# Patient Record
Sex: Male | Born: 1935
Health system: Southern US, Community
[De-identification: ages and names within clinical notes are randomized; demographics above are authoritative.]

## PROBLEM LIST (undated history)

## (undated) DIAGNOSIS — G5710 Meralgia paresthetica, unspecified lower limb: Secondary | ICD-10-CM

## (undated) DIAGNOSIS — R002 Palpitations: Secondary | ICD-10-CM

## (undated) DIAGNOSIS — K8020 Calculus of gallbladder without cholecystitis without obstruction: Secondary | ICD-10-CM

## (undated) DIAGNOSIS — I4891 Unspecified atrial fibrillation: Secondary | ICD-10-CM

## (undated) DIAGNOSIS — J449 Chronic obstructive pulmonary disease, unspecified: Secondary | ICD-10-CM

## (undated) DIAGNOSIS — N529 Male erectile dysfunction, unspecified: Secondary | ICD-10-CM

## (undated) DIAGNOSIS — H9190 Unspecified hearing loss, unspecified ear: Secondary | ICD-10-CM

## (undated) DIAGNOSIS — H269 Unspecified cataract: Secondary | ICD-10-CM

## (undated) DIAGNOSIS — R011 Cardiac murmur, unspecified: Secondary | ICD-10-CM

## (undated) DIAGNOSIS — D126 Benign neoplasm of colon, unspecified: Secondary | ICD-10-CM

## (undated) DIAGNOSIS — R339 Retention of urine, unspecified: Secondary | ICD-10-CM

## (undated) DIAGNOSIS — L309 Dermatitis, unspecified: Secondary | ICD-10-CM

## (undated) DIAGNOSIS — K219 Gastro-esophageal reflux disease without esophagitis: Secondary | ICD-10-CM

## (undated) DIAGNOSIS — N400 Enlarged prostate without lower urinary tract symptoms: Secondary | ICD-10-CM

## (undated) DIAGNOSIS — D329 Benign neoplasm of meninges, unspecified: Secondary | ICD-10-CM

## (undated) DIAGNOSIS — T7840XA Allergy, unspecified, initial encounter: Secondary | ICD-10-CM

## (undated) DIAGNOSIS — I10 Essential (primary) hypertension: Secondary | ICD-10-CM

## (undated) DIAGNOSIS — I6789 Other cerebrovascular disease: Principal | ICD-10-CM

## (undated) HISTORY — DX: Male erectile dysfunction, unspecified: N52.9

## (undated) HISTORY — PX: COLONOSCOPY W/ POLYPECTOMY: SHX1380

## (undated) HISTORY — DX: Benign neoplasm of colon, unspecified: D12.6

## (undated) HISTORY — DX: Essential (primary) hypertension: I10

## (undated) HISTORY — DX: Unspecified cataract: H26.9

## (undated) HISTORY — DX: Meralgia paresthetica, unspecified lower limb: G57.10

## (undated) HISTORY — DX: Retention of urine, unspecified: R33.9

## (undated) HISTORY — DX: Benign prostatic hyperplasia without lower urinary tract symptoms: N40.0

## (undated) HISTORY — DX: Calculus of gallbladder without cholecystitis without obstruction: K80.20

## (undated) HISTORY — DX: Allergy, unspecified, initial encounter: T78.40XA

## (undated) HISTORY — DX: Gastro-esophageal reflux disease without esophagitis: K21.9

## (undated) HISTORY — DX: Chronic obstructive pulmonary disease, unspecified: J44.9

## (undated) HISTORY — DX: Dermatitis, unspecified: L30.9

## (undated) HISTORY — PX: POLYPECTOMY: SHX149

## (undated) HISTORY — DX: Benign neoplasm of meninges, unspecified: D32.9

## (undated) HISTORY — PX: CARDIAC CATHETERIZATION: SHX172

## (undated) HISTORY — DX: Unspecified hearing loss, unspecified ear: H91.90

## (undated) HISTORY — DX: Palpitations: R00.2

## (undated) HISTORY — DX: Unspecified atrial fibrillation: I48.91

## (undated) HISTORY — PX: COLONOSCOPY: SHX174

## (undated) HISTORY — DX: Other cerebrovascular disease: I67.89

---

## 1997-12-31 HISTORY — PX: GUM SURGERY: SHX658

## 1998-10-21 ENCOUNTER — Ambulatory Visit (HOSPITAL_COMMUNITY): Admission: RE | Admit: 1998-10-21 | Discharge: 1998-10-21 | Payer: Self-pay | Admitting: Gastroenterology

## 2002-11-20 ENCOUNTER — Encounter: Admission: RE | Admit: 2002-11-20 | Discharge: 2002-11-20 | Payer: Self-pay | Admitting: Internal Medicine

## 2002-11-20 ENCOUNTER — Encounter: Payer: Self-pay | Admitting: Internal Medicine

## 2003-02-10 ENCOUNTER — Encounter: Payer: Self-pay | Admitting: Urology

## 2003-02-15 ENCOUNTER — Encounter (INDEPENDENT_AMBULATORY_CARE_PROVIDER_SITE_OTHER): Payer: Self-pay | Admitting: Specialist

## 2003-02-15 ENCOUNTER — Inpatient Hospital Stay (HOSPITAL_COMMUNITY): Admission: RE | Admit: 2003-02-15 | Discharge: 2003-02-22 | Payer: Self-pay | Admitting: Urology

## 2003-02-15 HISTORY — PX: SUPRAPUBIC PROSTATECTOMY: SUR1056

## 2003-02-15 HISTORY — PX: BLADDER DIVERTICULECTOMY: SHX1235

## 2003-02-17 ENCOUNTER — Encounter: Payer: Self-pay | Admitting: Urology

## 2003-02-21 ENCOUNTER — Encounter: Payer: Self-pay | Admitting: Urology

## 2003-06-01 HISTORY — PX: MEATOTOMY: SUR859

## 2003-06-23 ENCOUNTER — Ambulatory Visit (HOSPITAL_BASED_OUTPATIENT_CLINIC_OR_DEPARTMENT_OTHER): Admission: RE | Admit: 2003-06-23 | Discharge: 2003-06-23 | Payer: Self-pay | Admitting: Urology

## 2009-11-30 ENCOUNTER — Encounter: Admission: RE | Admit: 2009-11-30 | Discharge: 2009-11-30 | Payer: Self-pay | Admitting: General Surgery

## 2009-11-30 HISTORY — PX: INGUINAL HERNIA REPAIR: SUR1180

## 2009-12-02 ENCOUNTER — Ambulatory Visit (HOSPITAL_BASED_OUTPATIENT_CLINIC_OR_DEPARTMENT_OTHER): Admission: RE | Admit: 2009-12-02 | Discharge: 2009-12-02 | Payer: Self-pay | Admitting: General Surgery

## 2011-04-03 LAB — DIFFERENTIAL
Basophils Absolute: 0 10*3/uL (ref 0.0–0.1)
Eosinophils Relative: 2 % (ref 0–5)
Lymphocytes Relative: 36 % (ref 12–46)
Monocytes Absolute: 0.5 10*3/uL (ref 0.1–1.0)
Monocytes Relative: 7 % (ref 3–12)
Neutro Abs: 3.9 10*3/uL (ref 1.7–7.7)

## 2011-04-03 LAB — COMPREHENSIVE METABOLIC PANEL
AST: 18 U/L (ref 0–37)
Albumin: 3.8 g/dL (ref 3.5–5.2)
Alkaline Phosphatase: 59 U/L (ref 39–117)
BUN: 33 mg/dL — ABNORMAL HIGH (ref 6–23)
Chloride: 106 mEq/L (ref 96–112)
Creatinine, Ser: 1.02 mg/dL (ref 0.4–1.5)
GFR calc Af Amer: >60 mL/min (ref >60)
Potassium: 4.8 mEq/L (ref 3.5–5.1)
Total Bilirubin: 1 mg/dL (ref 0.3–1.2)
Total Protein: 5.7 g/dL — ABNORMAL LOW (ref 6.0–8.3)

## 2011-04-03 LAB — CBC
HCT: 46.3 % (ref 39.0–52.0)
Hemoglobin: 16 g/dL (ref 13.0–17.0)
MCHC: 34.6 g/dL (ref 30.0–36.0)
MCV: 86.3 fL (ref 78.0–100.0)
Platelets: 175 10*3/uL (ref 150–400)
RBC: 5.36 MIL/uL (ref 4.22–5.81)
RDW: 13.5 % (ref 11.5–15.5)
WBC: 7.1 10*3/uL (ref 4.0–10.5)

## 2011-05-18 NOTE — Op Note (Signed)
NAMELUKKA, BLACK                          ACCOUNT NO.:  [id number]   MEDICAL RECORD NO.:  [id number]                   PATIENT TYPE:  INP   LOCATION:  X002                                 FACILITY:  [facility]   PHYSICIAN:  Maretta Bees. Vonita Moss, M.D.             DATE OF BIRTH:  1929-10-17   DATE OF PROCEDURE:  02/15/2003  DATE OF DISCHARGE:                                 OPERATIVE REPORT   PREOPERATIVE DIAGNOSES:  1. Benign prostatic hypertrophy.  2. Prostatism.  3. Large residual urines.  4. Large left bladder diverticulum.   PROCEDURES:  Suprapubic prostatectomy and bladder diverticulectomy.   SURGEON:  Maretta Bees. Vonita Moss, M.D.   ASSISTANT:   ANESTHESIA:  General endotracheal.   INDICATIONS:  This is a 77 year old gentleman who has had a large prostate,  elevated PVRs, and elevated PSAs for a long time despite Proscar and  Terazosin therapy.  He has developed increased residual urine to up to 300  mL in his bladder and a known bladder diverticulum and now has increased to  over 1000 mL in size, causing some abdominal protuberance.  He was counseled  about the procedure and brought to the OR.   DESCRIPTION OF PROCEDURE:  The patient was brought to the operating room and  placed in the supine position and the lower abdomen and external genitalia  were prepped and draped in the usual fashion.  A 24 French 30 mL Foley was  inserted into the bladder without difficulty, and close to 2 L of urine were  removed.  A midline incision was made and the rectus fascia divided and the  left bladder diverticulum was partially dissected out, and then we opened up  the bladder in the midline and found the 3 cm mouth of this diverticulum and  put in bilateral ureteral catheters to protect the ureters.  The  diverticulum was stuffed with nine 4 x 18 gauzes to delineate it.  Sharp and  blunt dissection mobilized this diverticulum all the way back to just near  the left ureter.  The diverticular  opening was then resected with cautery  and the mucosa peeled away and with the left ureter protected, we dissected  down to the mouth of the diverticulum and removed the diverticulum  completely.  The mucosa and muscle of the diverticulum was closed on the  bladder side with running 2-0 chromic catgut, and outside 0 chromic catgut  was used to place a second layer in this diverticular neck closure.  He had  a very large median lobe, which was probably almost as big as his lateral  lobes, and after scoring the bladder neck with electrocautery and with the  ureteral catheters in place, blunt dissection was used to enucleate both  lateral lobes and the median lobes.  They were quite adherent since they  were not giant and not totally mature, but we got the prostatic fossa  cleaned  out quite nicely.  The bladder neck was then given hemostasis by  putting running 0 chromic catgut on each side of the bladder neck, again  with the ureteral catheters in place to protect the orifices.  A couple of  tiny bleeders in the prostatic fossa were coagulated and then a 24 French 30  mL Foley was inserted after redilating the urethral meatus.  Through a stab  wound to the right of our incision, a 22 French 5 mL Foley was brought  through the abdominal wall and in through the bladder and sewn in place with  a pursestring suture.  A Blake drain was placed to the left of our incision  through the stab wound and used to drain the prevesical space, and the  bladder was closed with two layers of running 0 chromic catgut.  There was 4-  5 mL in the Foley balloon and 10 mL in the suprapubic tube balloon.  The  bladder was then irrigated and there was nice, clear irrigation from the  Foley catheter after the second switch.  The wound was irrigated and the  rectus fascia closed with running #1 PDS, the subcu was irrigated, the skin  closed with skin staples.  The Blake drain and suprapubic tube were sewn in  place  with black silk.  The wound was cleaned, dressed with dry sterile  gauze dressing.  Sponge, needle, and instrument count was correct.  Estimated blood loss was 800 mL.  He tolerated the procedure well.                                                 Maretta Bees. Vonita Moss, M.D.    LJP/MEDQ  D:  02/15/2003  T:  02/15/2003  Job:  [id number]   cc:   Soyla Murphy. Renne Crigler, M.D.  [address]  [place]  [place] [address]  Fax: [phone]

## 2011-05-18 NOTE — Op Note (Signed)
   NAMEYOHAN, SAMONS                          ACCOUNT NO.:  [id number]   MEDICAL RECORD NO.:  [id number]                   PATIENT TYPE:  AMB   LOCATION:  NESC                                 FACILITY:  [facility]   PHYSICIAN:  Maretta Bees. Vonita Moss, M.D.             DATE OF BIRTH:  08/21/1929   DATE OF PROCEDURE:  06/23/2003  DATE OF DISCHARGE:                                 OPERATIVE REPORT   PREOPERATIVE DIAGNOSIS:  Distal urethral stricture/meatal stricture.   POSTOPERATIVE DIAGNOSIS:  Distal urethral stricture/meatal stricture.   PROCEDURE:  Distal urethral meatotomy with YAG laser.   SURGEON:  Maretta Bees. Vonita Moss, M.D.   ANESTHESIA:  General.   INDICATIONS:  This gentleman has undergone a suprapubic prostatectomy for  significant bladder outlet obstruction that also required a bladder  diverticulectomy, and he voids well except for development of a distal  urethral/meatal stricture that keeps recurring.  He is brought to the OR  today for an attempt to more definitively repair this with a meatotomy and  possible use of the laser.   PROCEDURE:  The patient was brought to the operating room and placed in the  lithotomy position.  External genitalia were prepped and draped in the usual  fashion.  I passed a 16 sound and without dilating him encountered distal  resistance.  I took a straight clamp and put it at 6 o'clock on the meatus  and put the clamp on for hemostasis and then used a hook-blade knife to  start the meatotomy.  I then put a 20 French red Robinson catheter in place  and with that in place to protect the opposite wall of the urethra and  spreading the start of the meatotomy, I used the YAG laser at 15 watts to  create a generous meatotomy that then allowed passage of sounds up to 4  [place] with no resistance whatsoever.  He will be taught to spread the  meatus postoperatively and continue some self-catheterizations.  He  tolerated the procedure well.                                       Maretta Bees. Vonita Moss, M.D.    LJP/MEDQ  D:  06/23/2003  T:  06/23/2003  Job:  [id number]

## 2011-05-18 NOTE — H&P (Signed)
Jared Neal, Jared Neal                          ACCOUNT NO.:  [id number]   MEDICAL RECORD NO.:  [id number]                   PATIENT TYPE:  INP   LOCATION:  NA                                   FACILITY:  [facility]   PHYSICIAN:  Maretta Bees. Vonita Moss, M.D.             DATE OF BIRTH:  May 23, 1929   DATE OF ADMISSION:  02/15/2003  DATE OF DISCHARGE:                                HISTORY & PHYSICAL   HISTORY OF PRESENT ILLNESS:  This 77 year old white male has had a long  history of bladder outlet obstructive symptoms, enlarged prostate, and  elevated PSA levels.  He has been on chronic therapy with Terazosin and  Proscar.  However, despite that, he has developed worsening of his large  residual urines and worsening of his retained urine, known bladder  diverticulum.  He has left bladder diverticulum now.  He had a residual of  1063 cc, and his bladder had 368 cc with an intravesical nodular prostate  tissue.  His PVR's in the past were just 100 cc to 215, and the diverticulum  was only 153 cc to 300 cc in size, so in view of this significant change and  large amount of retained urine, it was felt that suprapubic prostatectomy  and excision of the diverticulum was appropriate.  He was counseled about  hospital stay, hemorrhage, infection, injury to surrounding tissues, and the  need for an urethral and suprapubic tube.  He was cleared preoperatively by  Dr. Merri Brunette.   PAST MEDICAL HISTORY:  1. Hypertension.  2. Occasional cardiac arrhythmia.   PAST SURGICAL HISTORY:  Left inguinal hernia repair.   MEDICATIONS:  1. Norvasc 5 mg daily.  2. Terazosin 2 mg q.p.m. and 4 mg at bedtime.  3. Proscar 5 mg daily.  4. Aspirin, which he has stopped.  5. Viagra p.r.n.  6. Valium p.r.n.  7. Ibuprofen p.r.n.   ALLERGIES:  No known drug allergies.   HABITS:  He does drink wine.  He does smoke a very small number of  cigarettes daily.   FAMILY HISTORY:  Noncontributory.   REVIEW OF  SYMPTOMS:  No other abnormalities.   PHYSICAL EXAMINATION:  VITAL SIGNS:  His blood pressure is 160/84, pulse 72,  temperature 97.  GENERAL:  He is alert and oriented.  SKIN:  Warm and dry.  RESPIRATORY:  No respiratory distress.  HEART:  Heart tones are regular.  ABDOMEN:  Soft and nontender.  GENITOURINARY:  External genitalia normal.  Prostate is benign.   IMPRESSION:  1. Benign prostatic hypertrophy with prostatism.  2. Bladder diverticulum.  3. Hypertension.  4. History of cardiac arrythmia.  5. Chronic low-grade cigarette abuse.   PLAN:  Suprapubic prostatectomy and bladder diverticulectomy.  Maretta Bees. Vonita Moss, M.D.    LJP/MEDQ  D:  02/15/2003  T:  02/15/2003  Job:  [id number]

## 2011-05-18 NOTE — Discharge Summary (Signed)
Jared Neal, Jared Neal                          ACCOUNT NO.:  [id number]   MEDICAL RECORD NO.:  [id number]                   PATIENT TYPE:  INP   LOCATION:  0367                                 FACILITY:  [facility]   PHYSICIAN:  Maretta Bees. Vonita Moss, M.D.             DATE OF BIRTH:  [birth year]   DATE OF ADMISSION:  02/15/2003  DATE OF DISCHARGE:  02/22/2003                                 DISCHARGE SUMMARY   ADMISSION DIAGNOSES:  1. Bladder diverticulectomy.  2. Bladder outlet obstruction secondary to benign prostatic hyperplasia.   DISCHARGE DIAGNOSES:  1. Bladder diverticulectomy.  2. Bladder outlet obstruction secondary to benign prostatic hyperplasia.   PROCEDURE:  1. Bladder diverticulectomy.  2. Suprapubic prostatectomy.   HISTORY AND PHYSICAL:  For full details please see admission history and  physical.  Briefly, the patient is a gentleman with a history of lower  urinary tract symptom secondary to benign prostatic hyperplasia.  On  evaluation the patient was also noted to have a large bladder diverticulum  on the left posterior aspect of his bladder with a capacity that had  increased from just over 200 mL now to greater than 1 L.  After discussing  treatment options the patient elected to proceed with the bladder  diverticulectomy as well as a suprapubic prostatectomy at that time.   HOSPITAL COURSE:  The patient was taken to the operating room on February 15, 2003 and underwent the above procedures.  He tolerated these well and  without complications.  Postoperatively the patient was maintained on  continuous bladder irrigation and was transferred to the stepdown unit to  manage his bladder irrigation.  That evening the patient was noted to be  hypotensive with systolic blood pressures in the 70s and 80s.  He was  initially given a fluid challenge which did not correct his hypotension.  Of  note, the patient did not become tachycardic.  However, a CBC was checked to  ensure that, however, patient was not hypovolemic due to acute blood loss.  His hemoglobin did return lower than would be expected.  Therefore, the  patient was transfused 2 units of packed red blood cells.  He subsequently  became normotensive and was maintained in the intensive care unit for close  monitoring.  The following morning the patient's hemoglobin did appear to  increase appropriately after his transfusion.  However, the patient's  creatinine had increased to 2.4.  The patient was initially managed  conservatively.  The patient did require one more unit of packed red blood  cells, but then remained hemodynamically stable and with a stable hemoglobin  following this point.  Due to his acute renal failure a CT scan was obtained  to rule out an obstructive cause.  This did reveal a left-sided pelvic  hematoma which likely was the source of his initial acute blood loss.  There  appeared to be minimal to moderate left-sided  hydronephrosis, but no right-  sided hydronephrosis.  The patient also had been on a Cox 2 inhibitor which  was subsequently discontinued.  Renal consultation was also obtained.  The  patient's creatinine peaked as high as 3.3 and subsequently began declining.  The patient's continuous bladder irrigation was able to be shut off during  his renal failure and he was noted to continue to make adequate urine  output.  It was felt that the patient's acute renal failure was either due  to his recent hypovolemic episode or possibly his nonsteroidal anti-  inflammatory drug.  His renal function was monitored over the next few days  and his renal function subsequently returned to normal.  By postoperative  day seven the patient was tolerating a regular diet and ambulating without  difficulty.  His Foley catheter was able to be removed and his suprapubic  was left to straight drainage.  The patient did have some low grade fever  during his last couple of days in the hospital  as high as 101-102.  The  patient had a chest x-ray which demonstrated no evidence of pneumonia.  He  also had a CBC which demonstrated a normal white blood count.  A urinalysis  was performed and did demonstrate some pyuria.  This was sent for culture  and the patient was begun on Levaquin empirically.  His temperature was down  on the morning of postoperative day seven and it was decided to discharge  him home with plans to follow up his urine culture.  He was discharged home  in stable condition.   DISPOSITION:  Home.   DISCHARGE MEDICATIONS:  The patient was instructed to resume his regular  home medications.  In addition, he was given a prescription for Vicodin for  pain as well as Levaquin.   DISCHARGE INSTRUCTIONS:  The patient was instructed to resume his diet as  before surgery.  He was also encouraged to be ambulatory.  However, he was  specifically instructed to refrain from any heavy lifting, strenuous  activity, or driving.  He was instructed on use of a leg bag for his  suprapubic catheter.  He was also instructed to call should he have any  problems with continued fever or problems with his suprapubic tube.   FOLLOW UP:  A follow-up appointment was made for the patient to follow up  with Maretta Bees. Vonita Moss, M.D. in two days for further evaluation and possible  clamping of the suprapubic tube with a trial of voiding.     [name], M.D.                          Maretta Bees. Vonita Moss, M.D.    LB/MEDQ  D:  02/22/2003  T:  02/22/2003  Job:  [id number]

## 2012-01-01 HISTORY — PX: CATARACT EXTRACTION: SUR2

## 2012-03-25 DIAGNOSIS — L259 Unspecified contact dermatitis, unspecified cause: Secondary | ICD-10-CM | POA: Diagnosis not present

## 2012-03-25 DIAGNOSIS — B351 Tinea unguium: Secondary | ICD-10-CM | POA: Diagnosis not present

## 2012-03-25 DIAGNOSIS — L821 Other seborrheic keratosis: Secondary | ICD-10-CM | POA: Diagnosis not present

## 2012-06-25 DIAGNOSIS — IMO0002 Reserved for concepts with insufficient information to code with codable children: Secondary | ICD-10-CM | POA: Diagnosis not present

## 2012-07-16 DIAGNOSIS — L820 Inflamed seborrheic keratosis: Secondary | ICD-10-CM | POA: Diagnosis not present

## 2012-07-16 DIAGNOSIS — R002 Palpitations: Secondary | ICD-10-CM | POA: Diagnosis not present

## 2012-07-17 DIAGNOSIS — R002 Palpitations: Secondary | ICD-10-CM | POA: Diagnosis not present

## 2012-07-24 DIAGNOSIS — H2510 Age-related nuclear cataract, unspecified eye: Secondary | ICD-10-CM | POA: Diagnosis not present

## 2012-08-04 DIAGNOSIS — H2510 Age-related nuclear cataract, unspecified eye: Secondary | ICD-10-CM | POA: Diagnosis not present

## 2012-08-04 DIAGNOSIS — H2181 Floppy iris syndrome: Secondary | ICD-10-CM | POA: Diagnosis not present

## 2012-08-04 DIAGNOSIS — H269 Unspecified cataract: Secondary | ICD-10-CM | POA: Diagnosis not present

## 2012-08-06 DIAGNOSIS — H2510 Age-related nuclear cataract, unspecified eye: Secondary | ICD-10-CM | POA: Diagnosis not present

## 2012-08-18 DIAGNOSIS — H2510 Age-related nuclear cataract, unspecified eye: Secondary | ICD-10-CM | POA: Diagnosis not present

## 2012-08-30 DIAGNOSIS — Z23 Encounter for immunization: Secondary | ICD-10-CM | POA: Diagnosis not present

## 2012-10-14 DIAGNOSIS — Z7902 Long term (current) use of antithrombotics/antiplatelets: Secondary | ICD-10-CM | POA: Diagnosis not present

## 2012-10-14 DIAGNOSIS — I10 Essential (primary) hypertension: Secondary | ICD-10-CM | POA: Diagnosis not present

## 2012-10-14 DIAGNOSIS — K219 Gastro-esophageal reflux disease without esophagitis: Secondary | ICD-10-CM | POA: Diagnosis not present

## 2012-10-14 DIAGNOSIS — Z0000 Encounter for general adult medical examination without abnormal findings: Secondary | ICD-10-CM | POA: Diagnosis not present

## 2012-10-16 DIAGNOSIS — R9431 Abnormal electrocardiogram [ECG] [EKG]: Secondary | ICD-10-CM | POA: Diagnosis not present

## 2012-10-16 DIAGNOSIS — K219 Gastro-esophageal reflux disease without esophagitis: Secondary | ICD-10-CM | POA: Diagnosis not present

## 2012-10-16 DIAGNOSIS — R972 Elevated prostate specific antigen [PSA]: Secondary | ICD-10-CM | POA: Diagnosis not present

## 2012-10-16 DIAGNOSIS — R339 Retention of urine, unspecified: Secondary | ICD-10-CM | POA: Diagnosis not present

## 2012-11-24 DIAGNOSIS — R002 Palpitations: Secondary | ICD-10-CM | POA: Diagnosis not present

## 2012-11-24 DIAGNOSIS — I10 Essential (primary) hypertension: Secondary | ICD-10-CM | POA: Diagnosis not present

## 2012-12-08 DIAGNOSIS — R39198 Other difficulties with micturition: Secondary | ICD-10-CM | POA: Diagnosis not present

## 2012-12-08 DIAGNOSIS — N401 Enlarged prostate with lower urinary tract symptoms: Secondary | ICD-10-CM | POA: Diagnosis not present

## 2012-12-08 DIAGNOSIS — N35919 Unspecified urethral stricture, male, unspecified site: Secondary | ICD-10-CM | POA: Diagnosis not present

## 2012-12-12 ENCOUNTER — Encounter: Payer: Self-pay | Admitting: Cardiovascular Disease

## 2012-12-12 ENCOUNTER — Encounter: Payer: Self-pay | Admitting: *Deleted

## 2012-12-12 DIAGNOSIS — R002 Palpitations: Secondary | ICD-10-CM | POA: Insufficient documentation

## 2012-12-12 DIAGNOSIS — N529 Male erectile dysfunction, unspecified: Secondary | ICD-10-CM | POA: Insufficient documentation

## 2012-12-12 DIAGNOSIS — R339 Retention of urine, unspecified: Secondary | ICD-10-CM | POA: Insufficient documentation

## 2012-12-12 DIAGNOSIS — I10 Essential (primary) hypertension: Secondary | ICD-10-CM | POA: Insufficient documentation

## 2012-12-12 DIAGNOSIS — L309 Dermatitis, unspecified: Secondary | ICD-10-CM | POA: Insufficient documentation

## 2012-12-12 DIAGNOSIS — G5710 Meralgia paresthetica, unspecified lower limb: Secondary | ICD-10-CM | POA: Insufficient documentation

## 2012-12-12 DIAGNOSIS — J449 Chronic obstructive pulmonary disease, unspecified: Secondary | ICD-10-CM | POA: Insufficient documentation

## 2012-12-12 DIAGNOSIS — K219 Gastro-esophageal reflux disease without esophagitis: Secondary | ICD-10-CM | POA: Insufficient documentation

## 2012-12-15 ENCOUNTER — Ambulatory Visit (INDEPENDENT_AMBULATORY_CARE_PROVIDER_SITE_OTHER): Payer: Medicare Other | Admitting: Cardiovascular Disease

## 2012-12-15 ENCOUNTER — Encounter: Payer: Self-pay | Admitting: Cardiovascular Disease

## 2012-12-15 VITALS — BP 130/80 | HR 68 | Resp 12 | Ht 71.0 in | Wt 161.0 lb

## 2012-12-15 DIAGNOSIS — R9431 Abnormal electrocardiogram [ECG] [EKG]: Secondary | ICD-10-CM | POA: Diagnosis not present

## 2012-12-15 DIAGNOSIS — R002 Palpitations: Secondary | ICD-10-CM | POA: Diagnosis not present

## 2012-12-15 DIAGNOSIS — I10 Essential (primary) hypertension: Secondary | ICD-10-CM

## 2012-12-15 NOTE — Progress Notes (Signed)
Patient ID: Jared Neal, male   DOB: 1929/10/18, 78 y.o.   MRN: [id number] 78 yo referred by Dr Renne Crigler. Has had HTN for over 30 years.  On two drug Rx.  Had cold recently and BP was elevated.  Was taking alkaseltzer cold medicine and nyquil.  Compliant with meds.  Exercises daily with no chest pain or dyspnea. BP is getting better over the last two weeks as cold subsided.  History of palpitations.  Now quiescent.  No history of PAF.  No history of kidney disease.  No other history of PVD or vascular disease.  ECG 11/23 with lateral T wave changes Has had bad reaction to procardia in past but not to any other BP meds  ROS: Denies fever, malais, weight loss, blurry vision, decreased visual acuity, cough, sputum, SOB, hemoptysis, pleuritic pain, palpitaitons, heartburn, abdominal pain, melena, lower extremity edema, claudication, or rash.  All other systems reviewed and negative   General: Affect appropriate Healthy:  appears stated age HEENT: normal Neck supple with no adenopathy JVP normal no bruits no thyromegaly Lungs clear with no wheezing and good diaphragmatic motion Heart:  S1/S2 no murmur,rub, gallop or click PMI normal Abdomen: benighn, BS positve, no tenderness, no AAA no bruit.  No HSM or HJR Distal pulses intact with no bruits No edema Neuro non-focal Skin warm and dry No muscular weakness  Medications Current Outpatient Prescriptions  Medication Sig Dispense Refill  . amLODipine (NORVASC) 5 MG tablet Take 1 tablet by mouth Daily.      . diclofenac sodium (VOLTAREN) 1 % GEL Apply topically as needed.      . dorzolamide-timolol (COSOPT) 22.3-6.8 MG/ML ophthalmic solution as directed.      Marland Kitchen doxylamine, Sleep, (UNISOM) 25 MG tablet Take 25 mg by mouth as needed.      . pantoprazole (PROTONIX) 40 MG tablet Take 1 tablet by mouth Daily.      Marland Kitchen terazosin (HYTRIN) 10 MG capsule Take 1 tablet by mouth Daily.        Allergies Procardia  Family History: No family history on  file.  Social History: History   Social History  . Marital Status: Divorced    Spouse Name: N/A    Number of Children: N/A  . Years of Education: N/A   Occupational History  . Not on file.   Social History Main Topics  . Smoking status: Not on file  . Smokeless tobacco: Not on file  . Alcohol Use: Not on file  . Drug Use: Not on file  . Sexually Active: Not on file   Other Topics Concern  . Not on file   Social History Narrative  . No narrative on file    Electrocardiogram:  11/22/12  Dr Phasrr's office  SR lateral T wave inversion  Assessment and Plan

## 2012-12-15 NOTE — Assessment & Plan Note (Signed)
Resolved with normal ECG and regular exercise with no recurrence

## 2012-12-15 NOTE — Assessment & Plan Note (Signed)
Currently back to baseline.  Although his current meds are not my first choice for BP meds he has been on them for a while and can continue Can increase norvasc to 10 mg if needed.  No need for w/u of secondary causes

## 2012-12-15 NOTE — Assessment & Plan Note (Signed)
Cannot find old one in system to compare.  Not classically LVH but may be related to long standing HTN.  In the abscence of symptoms no indication for further w/u  Yearly ECG

## 2012-12-15 NOTE — Patient Instructions (Signed)
Your physician wants you to follow-up in: YEAR WITH DR NISHAN  You will receive a reminder letter in the mail two months in advance. If you don't receive a letter, please call our office to schedule the follow-up appointment.  Your physician recommends that you continue on your current medications as directed. Please refer to the Current Medication list given to you today. 

## 2013-01-05 ENCOUNTER — Encounter: Payer: Self-pay | Admitting: Cardiovascular Disease

## 2013-01-06 DIAGNOSIS — H409 Unspecified glaucoma: Secondary | ICD-10-CM | POA: Diagnosis not present

## 2013-01-06 DIAGNOSIS — Z961 Presence of intraocular lens: Secondary | ICD-10-CM | POA: Diagnosis not present

## 2013-01-06 DIAGNOSIS — H4011X0 Primary open-angle glaucoma, stage unspecified: Secondary | ICD-10-CM | POA: Diagnosis not present

## 2013-01-06 DIAGNOSIS — H04129 Dry eye syndrome of unspecified lacrimal gland: Secondary | ICD-10-CM | POA: Diagnosis not present

## 2013-01-20 DIAGNOSIS — I10 Essential (primary) hypertension: Secondary | ICD-10-CM | POA: Diagnosis not present

## 2013-01-20 DIAGNOSIS — R9431 Abnormal electrocardiogram [ECG] [EKG]: Secondary | ICD-10-CM | POA: Diagnosis not present

## 2013-02-14 ENCOUNTER — Other Ambulatory Visit: Payer: Self-pay

## 2013-03-05 DIAGNOSIS — R141 Gas pain: Secondary | ICD-10-CM | POA: Diagnosis not present

## 2013-03-05 DIAGNOSIS — K2990 Gastroduodenitis, unspecified, without bleeding: Secondary | ICD-10-CM | POA: Diagnosis not present

## 2013-03-05 DIAGNOSIS — R143 Flatulence: Secondary | ICD-10-CM | POA: Diagnosis not present

## 2013-03-05 DIAGNOSIS — K2970 Gastritis, unspecified, without bleeding: Secondary | ICD-10-CM | POA: Diagnosis not present

## 2013-03-10 DIAGNOSIS — H4011X0 Primary open-angle glaucoma, stage unspecified: Secondary | ICD-10-CM | POA: Diagnosis not present

## 2013-03-10 DIAGNOSIS — Z961 Presence of intraocular lens: Secondary | ICD-10-CM | POA: Diagnosis not present

## 2013-03-10 DIAGNOSIS — H04129 Dry eye syndrome of unspecified lacrimal gland: Secondary | ICD-10-CM | POA: Diagnosis not present

## 2013-03-12 DIAGNOSIS — K921 Melena: Secondary | ICD-10-CM | POA: Diagnosis not present

## 2013-03-12 DIAGNOSIS — K219 Gastro-esophageal reflux disease without esophagitis: Secondary | ICD-10-CM | POA: Diagnosis not present

## 2013-03-18 DIAGNOSIS — Z1212 Encounter for screening for malignant neoplasm of rectum: Secondary | ICD-10-CM | POA: Diagnosis not present

## 2013-04-16 DIAGNOSIS — R9431 Abnormal electrocardiogram [ECG] [EKG]: Secondary | ICD-10-CM | POA: Diagnosis not present

## 2013-04-21 DIAGNOSIS — K921 Melena: Secondary | ICD-10-CM | POA: Diagnosis not present

## 2013-04-21 DIAGNOSIS — K219 Gastro-esophageal reflux disease without esophagitis: Secondary | ICD-10-CM | POA: Diagnosis not present

## 2013-08-05 ENCOUNTER — Other Ambulatory Visit: Payer: Self-pay

## 2013-08-24 DIAGNOSIS — Z23 Encounter for immunization: Secondary | ICD-10-CM | POA: Diagnosis not present

## 2013-09-09 DIAGNOSIS — H40019 Open angle with borderline findings, low risk, unspecified eye: Secondary | ICD-10-CM | POA: Diagnosis not present

## 2013-09-09 DIAGNOSIS — H4011X0 Primary open-angle glaucoma, stage unspecified: Secondary | ICD-10-CM | POA: Diagnosis not present

## 2013-09-09 DIAGNOSIS — Z961 Presence of intraocular lens: Secondary | ICD-10-CM | POA: Diagnosis not present

## 2013-10-27 DIAGNOSIS — I10 Essential (primary) hypertension: Secondary | ICD-10-CM | POA: Diagnosis not present

## 2013-10-27 DIAGNOSIS — K219 Gastro-esophageal reflux disease without esophagitis: Secondary | ICD-10-CM | POA: Diagnosis not present

## 2013-10-27 DIAGNOSIS — Z006 Encounter for examination for normal comparison and control in clinical research program: Secondary | ICD-10-CM | POA: Diagnosis not present

## 2013-10-27 DIAGNOSIS — Z0000 Encounter for general adult medical examination without abnormal findings: Secondary | ICD-10-CM | POA: Diagnosis not present

## 2013-10-27 DIAGNOSIS — J449 Chronic obstructive pulmonary disease, unspecified: Secondary | ICD-10-CM | POA: Diagnosis not present

## 2013-11-02 DIAGNOSIS — R002 Palpitations: Secondary | ICD-10-CM | POA: Diagnosis not present

## 2013-11-02 DIAGNOSIS — R9431 Abnormal electrocardiogram [ECG] [EKG]: Secondary | ICD-10-CM | POA: Diagnosis not present

## 2013-11-02 DIAGNOSIS — N401 Enlarged prostate with lower urinary tract symptoms: Secondary | ICD-10-CM | POA: Diagnosis not present

## 2013-11-02 DIAGNOSIS — K219 Gastro-esophageal reflux disease without esophagitis: Secondary | ICD-10-CM | POA: Diagnosis not present

## 2013-11-05 ENCOUNTER — Other Ambulatory Visit: Payer: Self-pay

## 2013-11-23 DIAGNOSIS — N390 Urinary tract infection, site not specified: Secondary | ICD-10-CM | POA: Diagnosis not present

## 2013-11-23 DIAGNOSIS — R300 Dysuria: Secondary | ICD-10-CM | POA: Diagnosis not present

## 2013-12-09 ENCOUNTER — Other Ambulatory Visit: Payer: Self-pay | Admitting: Endocrinology

## 2013-12-09 ENCOUNTER — Other Ambulatory Visit: Payer: Self-pay | Admitting: Internal Medicine

## 2013-12-09 DIAGNOSIS — M79609 Pain in unspecified limb: Secondary | ICD-10-CM | POA: Diagnosis not present

## 2013-12-09 DIAGNOSIS — R52 Pain, unspecified: Secondary | ICD-10-CM

## 2013-12-09 DIAGNOSIS — M79604 Pain in right leg: Secondary | ICD-10-CM

## 2013-12-10 ENCOUNTER — Ambulatory Visit
Admission: RE | Admit: 2013-12-10 | Discharge: 2013-12-10 | Disposition: A | Discharge status: Home / Self Care | Payer: Medicare Other | Source: Ambulatory Visit | Attending: Endocrinology | Admitting: Endocrinology

## 2013-12-10 DIAGNOSIS — M79609 Pain in unspecified limb: Secondary | ICD-10-CM | POA: Diagnosis not present

## 2013-12-10 DIAGNOSIS — R52 Pain, unspecified: Secondary | ICD-10-CM

## 2013-12-15 DIAGNOSIS — N401 Enlarged prostate with lower urinary tract symptoms: Secondary | ICD-10-CM | POA: Diagnosis not present

## 2013-12-15 DIAGNOSIS — N139 Obstructive and reflux uropathy, unspecified: Secondary | ICD-10-CM | POA: Diagnosis not present

## 2013-12-16 ENCOUNTER — Encounter: Payer: Self-pay | Admitting: Cardiovascular Disease

## 2013-12-16 ENCOUNTER — Ambulatory Visit (INDEPENDENT_AMBULATORY_CARE_PROVIDER_SITE_OTHER): Payer: Medicare Other | Admitting: Cardiovascular Disease

## 2013-12-16 VITALS — BP 140/80 | HR 80 | Ht 71.0 in | Wt 161.0 lb

## 2013-12-16 DIAGNOSIS — I4949 Other premature depolarization: Secondary | ICD-10-CM | POA: Diagnosis not present

## 2013-12-16 DIAGNOSIS — R002 Palpitations: Secondary | ICD-10-CM

## 2013-12-16 DIAGNOSIS — R9431 Abnormal electrocardiogram [ECG] [EKG]: Secondary | ICD-10-CM

## 2013-12-16 DIAGNOSIS — R011 Cardiac murmur, unspecified: Secondary | ICD-10-CM

## 2013-12-16 DIAGNOSIS — I493 Ventricular premature depolarization: Secondary | ICD-10-CM

## 2013-12-16 DIAGNOSIS — I10 Essential (primary) hypertension: Secondary | ICD-10-CM

## 2013-12-16 NOTE — Assessment & Plan Note (Signed)
Continue alpha blocker recent PSA with primary ok per patient Dr Renne Crigler

## 2013-12-16 NOTE — Assessment & Plan Note (Signed)
Resolved He really does not notice PVC;s  Continue Rx for BP

## 2013-12-16 NOTE — Assessment & Plan Note (Signed)
Apical MR murmur seems more prominent In setting of abnormal ECG, and PVC;s will check echo

## 2013-12-16 NOTE — Assessment & Plan Note (Signed)
Well controlled.  Continue current medications and low sodium Dash type diet.    

## 2013-12-16 NOTE — Patient Instructions (Signed)

## 2013-12-16 NOTE — Progress Notes (Signed)
Patient ID: Jared Neal, male   DOB: 17-Jun-1929, 79 y.o.   MRN: [id number] 79 yo referred by Dr Renne Crigler in 2013 . Has had HTN for over 30 years. On two drug Rx. Had cold recently and BP was elevated. Was taking alkaseltzer cold medicine and nyquil. Compliant with meds. Exercises daily with no chest pain or dyspnea. BP is getting better over the last two weeks as cold subsided. History of palpitations. Now quiescent. No history of PAF. No history of kidney disease. No other history of PVD or vascular disease. ECG 11/23 with lateral T wave changes Has had bad reaction to procardia in past but not to any other BP meds  Holter showing on ly PAC;s and PVC;s done 06/2012    Walks 2-4 miles/day on trails Does not notice PVC;s  No chest pain or dyspnea     ROS: Denies fever, malais, weight loss, blurry vision, decreased visual acuity, cough, sputum, SOB, hemoptysis, pleuritic pain, palpitaitons, heartburn, abdominal pain, melena, lower extremity edema, claudication, or rash.  All other systems reviewed and negative  General: Affect appropriate Healthy:  appears stated age HEENT: normal Neck supple with no adenopathy JVP normal no bruits no thyromegaly Lungs clear with no wheezing and good diaphragmatic motion Heart:  S1/S2 MR  Murmur at apex , no rub, gallop or click PMI normal Abdomen: benighn, BS positve, no tenderness, no AAA no bruit.  No HSM or HJR Distal pulses intact with no bruits No edema Neuro non-focal Skin warm and dry No muscular weakness   Current Outpatient Prescriptions  Medication Sig Dispense Refill  . amLODipine (NORVASC) 5 MG tablet Take 1 tablet by mouth Daily.      . diclofenac sodium (VOLTAREN) 1 % GEL Apply topically as needed.      . dorzolamide-timolol (COSOPT) 22.3-6.8 MG/ML ophthalmic solution as directed.      Marland Kitchen doxylamine, Sleep, (UNISOM) 25 MG tablet Take 25 mg by mouth as needed.      . pantoprazole (PROTONIX) 40 MG tablet Take 1 tablet by mouth Daily.      Marland Kitchen  terazosin (HYTRIN) 10 MG capsule Take 1 tablet by mouth Daily.       No current facility-administered medications for this visit.    Allergies  Procardia  Electrocardiogram:  11/25/12  SR rate 76 lateral T wave inversions  Today   Assessment and Plan

## 2013-12-16 NOTE — Assessment & Plan Note (Signed)
Chronic no change from 2013  May be reflective of LVH  Primarily lateral T wave inversions

## 2013-12-29 DIAGNOSIS — J441 Chronic obstructive pulmonary disease with (acute) exacerbation: Secondary | ICD-10-CM | POA: Diagnosis not present

## 2013-12-29 DIAGNOSIS — R609 Edema, unspecified: Secondary | ICD-10-CM | POA: Diagnosis not present

## 2014-01-05 ENCOUNTER — Ambulatory Visit (HOSPITAL_COMMUNITY): Payer: Medicare Other | Attending: Cardiology | Admitting: Radiology

## 2014-01-05 ENCOUNTER — Encounter: Payer: Self-pay | Admitting: Cardiology

## 2014-01-05 DIAGNOSIS — R002 Palpitations: Secondary | ICD-10-CM | POA: Diagnosis not present

## 2014-01-05 DIAGNOSIS — J449 Chronic obstructive pulmonary disease, unspecified: Secondary | ICD-10-CM | POA: Insufficient documentation

## 2014-01-05 DIAGNOSIS — I493 Ventricular premature depolarization: Secondary | ICD-10-CM

## 2014-01-05 DIAGNOSIS — I4949 Other premature depolarization: Secondary | ICD-10-CM | POA: Insufficient documentation

## 2014-01-05 DIAGNOSIS — I10 Essential (primary) hypertension: Secondary | ICD-10-CM | POA: Insufficient documentation

## 2014-01-05 DIAGNOSIS — R011 Cardiac murmur, unspecified: Secondary | ICD-10-CM | POA: Insufficient documentation

## 2014-01-05 DIAGNOSIS — R9431 Abnormal electrocardiogram [ECG] [EKG]: Secondary | ICD-10-CM | POA: Diagnosis not present

## 2014-01-05 DIAGNOSIS — I059 Rheumatic mitral valve disease, unspecified: Secondary | ICD-10-CM | POA: Insufficient documentation

## 2014-01-05 DIAGNOSIS — J4489 Other specified chronic obstructive pulmonary disease: Secondary | ICD-10-CM | POA: Insufficient documentation

## 2014-01-05 NOTE — Progress Notes (Signed)
Echocardiogram performed.  

## 2014-01-07 ENCOUNTER — Telehealth: Payer: Self-pay | Admitting: Cardiovascular Disease

## 2014-01-07 NOTE — Telephone Encounter (Signed)
PT AWARE OF ECHO RESULTS./CY 

## 2014-01-07 NOTE — Telephone Encounter (Signed)
New message    Returning nurses call

## 2014-01-21 DIAGNOSIS — R609 Edema, unspecified: Secondary | ICD-10-CM | POA: Diagnosis not present

## 2014-01-21 DIAGNOSIS — J449 Chronic obstructive pulmonary disease, unspecified: Secondary | ICD-10-CM | POA: Diagnosis not present

## 2014-02-15 DIAGNOSIS — S60459A Superficial foreign body of unspecified finger, initial encounter: Secondary | ICD-10-CM | POA: Diagnosis not present

## 2014-03-24 DIAGNOSIS — Z961 Presence of intraocular lens: Secondary | ICD-10-CM | POA: Diagnosis not present

## 2014-03-24 DIAGNOSIS — H40019 Open angle with borderline findings, low risk, unspecified eye: Secondary | ICD-10-CM | POA: Diagnosis not present

## 2014-03-24 DIAGNOSIS — H4720 Unspecified optic atrophy: Secondary | ICD-10-CM | POA: Diagnosis not present

## 2014-03-24 DIAGNOSIS — H4011X0 Primary open-angle glaucoma, stage unspecified: Secondary | ICD-10-CM | POA: Diagnosis not present

## 2014-03-25 DIAGNOSIS — H401290 Low-tension glaucoma, unspecified eye, stage unspecified: Secondary | ICD-10-CM | POA: Diagnosis not present

## 2014-04-02 ENCOUNTER — Telehealth (HOSPITAL_COMMUNITY): Payer: Self-pay | Admitting: *Deleted

## 2014-04-02 ENCOUNTER — Other Ambulatory Visit (HOSPITAL_COMMUNITY): Payer: Self-pay | Admitting: Internal Medicine

## 2014-04-02 DIAGNOSIS — S0990XA Unspecified injury of head, initial encounter: Secondary | ICD-10-CM

## 2014-04-07 ENCOUNTER — Ambulatory Visit (INDEPENDENT_AMBULATORY_CARE_PROVIDER_SITE_OTHER): Payer: Medicare Other | Admitting: Neurology

## 2014-04-07 ENCOUNTER — Encounter: Payer: Self-pay | Admitting: Neurology

## 2014-04-07 VITALS — BP 134/80 | HR 76 | Ht 71.0 in | Wt 160.0 lb

## 2014-04-07 DIAGNOSIS — I6789 Other cerebrovascular disease: Secondary | ICD-10-CM

## 2014-04-07 HISTORY — DX: Other cerebrovascular disease: I67.89

## 2014-04-07 MED ORDER — CLOPIDOGREL BISULFATE 75 MG PO TABS: 75.0000 mg | ORAL_TABLET | Freq: Every day | ORAL | Status: DC

## 2014-04-07 NOTE — Patient Instructions (Signed)

## 2014-04-07 NOTE — Progress Notes (Signed)
Reason for visit: Cerebrovascular disease  Jared Neal is a 80 y.o. male  History of present illness:  Jared Neal is an 80 year old right-handed white male with a history of hypertension. The patient has been evaluated for possible glaucoma, and secondary to findings of optic atrophy, the patient was sent for MRI evaluation of the brain. As an ancillary finding, a punctate acute infarct was noted in the right frontal area cortex. 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 The patient has not been on aspirin as he cannot tolerate this medication. The patient has not noted any new symptoms of numbness, weakness, speech changes, visual loss, double vision, or gait changes. The patient has never been told that he has had a stroke before. The patient has well-controlled hypertension. The patient is a former smoker. The patient is sent to this office for an evaluation. The patient has already had a 2-D echocardiogram in January 2015 revealing an ejection fraction of 60-65% with mild mitral regurgitation.  Past Medical History  Diagnosis Date  . Palpitations   . HTN (hypertension)   . GERD (gastroesophageal reflux disease)   . Urinary retention   . Eczema   . COPD (chronic obstructive pulmonary disease)   . Erectile dysfunction   . Meralgia paraesthetica   . BPH (benign prostatic hyperplasia)   . Acute, but ill-defined, cerebrovascular disease 04/07/2014  . HOH (hard of hearing)   . Meningioma     Right frontal    Past Surgical History  Procedure Laterality Date  . Cardiac catheterization    . Cataract extraction  2013    both  . Prostatectomy  1997  . Hernia repair  2010    Bilateral  . Gum surgery  1999  . Colonoscopy w/ polypectomy      Family History  Problem Relation Age of Onset  . Alzheimer's disease Mother   . Heart attack Father   .  Cancer - Lung Sister     Social history:  reports that he has quit smoking. His smoking use included Cigarettes. He has a 8 pack-year smoking history. He has never used smokeless tobacco. He reports that he drinks alcohol. He reports that he does not use illicit drugs.  Medications:  Current Outpatient Prescriptions on File Prior to Visit  Medication Sig Dispense Refill  . amLODipine (NORVASC) 5 MG tablet Take 1 tablet by mouth Daily.      . dorzolamide-timolol (COSOPT) 22.3-6.8 MG/ML ophthalmic solution as directed.      . pantoprazole (PROTONIX) 40 MG tablet Take 1 tablet by mouth Daily.      Marland Kitchen terazosin (HYTRIN) 10 MG capsule Take 1 tablet by mouth Daily.       No current facility-administered medications on file prior to visit.      Allergies  Allergen Reactions  . Procardia [Nifedipine]     Headaches     ROS:  Out of a complete 14 system review of symptoms, the patient complains only of the following symptoms, and all other reviewed systems are negative.  Hearing loss, ringing in the ears Stroke by MRI  Blood pressure 134/80, pulse 76, height 5\' 11"  (1.803 m), weight 160 lb (72.576 kg).  Physical Exam  General: The patient is alert and cooperative at the time of the examination.  Eyes: Pupils are equal, round, and reactive to light. Discs are flat  bilaterally.  Neck: The neck is supple, no carotid bruits are noted.  Respiratory: The respiratory examination is clear.  Cardiovascular: The cardiovascular examination reveals a regular rate and rhythm, no obvious murmurs or rubs are noted.  Skin: Extremities are with 1+ edema at the ankles bilaterally.  Neurologic Exam  Mental status: The patient is alert and oriented x 3 at the time of the examination. The patient has apparent normal recent and remote memory, with an apparently normal attention span and concentration ability.  Cranial nerves: Facial symmetry is present. There is good sensation of the face to  pinprick and soft touch bilaterally. The strength of the facial muscles and the muscles to head turning and shoulder shrug are normal bilaterally. Speech is well enunciated, no aphasia or dysarthria is noted. Extraocular movements are full. Visual fields are full. The tongue is midline, and the patient has symmetric elevation of the soft palate. No obvious hearing deficits are noted.  Motor: The motor testing reveals 5 over 5 strength of all 4 extremities. Good symmetric motor tone is noted throughout.  Sensory: Sensory testing is intact to pinprick, soft touch, vibration sensation, and position sense on all 4 extremities. No evidence of extinction is noted.  Coordination: Cerebellar testing reveals good finger-nose-finger and heel-to-shin bilaterally.  Gait and station: Gait is normal. Tandem gait is normal. Romberg is negative. No drift is seen.  Reflexes: Deep tendon reflexes are symmetric and normal bilaterally. Toes are downgoing bilaterally.   Assessment/Plan:  1. Punctate right frontal stroke  2. Hypertension  The patient has minimal risk factors for stroke. The patient has a punctate acute lesion in the right frontal lobe that may represent a small vessel infarct. The clinical significance of this is not clear. The patient has already undergone a 2D echocardiogram recently, and I will set the patient up for a carotid Doppler study. The patient will be placed on Plavix, as he is unable to tolerate aspirin. The patient will followup through this office if needed. The patient has no neurologic deficits at this point.  Jared Alexanders MD 04/07/2014 9:14 PM  [facility] [address] [place], [place] [address]  Phone [phone] Fax [phone]

## 2014-04-13 ENCOUNTER — Other Ambulatory Visit (HOSPITAL_COMMUNITY): Payer: Self-pay | Admitting: Internal Medicine

## 2014-04-13 ENCOUNTER — Ambulatory Visit (HOSPITAL_COMMUNITY)
Admission: RE | Admit: 2014-04-13 | Discharge: 2014-04-13 | Disposition: A | Discharge status: Home / Self Care | Payer: Medicare Other | Source: Ambulatory Visit | Attending: Cardiovascular Disease | Admitting: Cardiovascular Disease

## 2014-04-13 DIAGNOSIS — I6789 Other cerebrovascular disease: Secondary | ICD-10-CM | POA: Insufficient documentation

## 2014-04-13 DIAGNOSIS — L259 Unspecified contact dermatitis, unspecified cause: Secondary | ICD-10-CM | POA: Diagnosis not present

## 2014-04-13 DIAGNOSIS — I6320 Cerebral infarction due to unspecified occlusion or stenosis of unspecified precerebral arteries: Secondary | ICD-10-CM | POA: Diagnosis not present

## 2014-04-13 DIAGNOSIS — S0990XA Unspecified injury of head, initial encounter: Secondary | ICD-10-CM

## 2014-04-13 DIAGNOSIS — L821 Other seborrheic keratosis: Secondary | ICD-10-CM | POA: Diagnosis not present

## 2014-04-13 NOTE — Progress Notes (Signed)
Carotid Duplex Completed. Miki Labuda, BS, RDMS, RVT  

## 2014-04-21 ENCOUNTER — Other Ambulatory Visit: Payer: Medicare Other

## 2014-04-27 DIAGNOSIS — K5289 Other specified noninfective gastroenteritis and colitis: Secondary | ICD-10-CM | POA: Diagnosis not present

## 2014-06-26 ENCOUNTER — Ambulatory Visit (INDEPENDENT_AMBULATORY_CARE_PROVIDER_SITE_OTHER): Payer: Medicare Other | Admitting: Family Medicine

## 2014-06-26 VITALS — BP 130/80 | HR 69 | Temp 98.1°F | Resp 16 | Ht 70.0 in | Wt 157.0 lb

## 2014-06-26 DIAGNOSIS — S90569A Insect bite (nonvenomous), unspecified ankle, initial encounter: Secondary | ICD-10-CM

## 2014-06-26 DIAGNOSIS — S70361A Insect bite (nonvenomous), right thigh, initial encounter: Secondary | ICD-10-CM

## 2014-06-26 DIAGNOSIS — B356 Tinea cruris: Secondary | ICD-10-CM | POA: Diagnosis not present

## 2014-06-26 DIAGNOSIS — W57XXXA Bitten or stung by nonvenomous insect and other nonvenomous arthropods, initial encounter: Secondary | ICD-10-CM

## 2014-06-26 MED ORDER — CLOTRIMAZOLE 1 % EX CREA: 1.0000 "application " | TOPICAL_CREAM | Freq: Two times a day (BID) | CUTANEOUS | Status: DC

## 2014-06-26 NOTE — Progress Notes (Signed)
Subjective:    Patient ID: Jared Neal, male    DOB: May 30, 1929, 80 y.o.   MRN: [id number]  HPI Jared Neal is a 80 y.o. male   4 days ago -felt a bump on the back of R thigh. Partner looked at area yesterday - removed as was a tick. Brought in tick today.  Noticed rash yesterday after removing the tick. Unsure if any change in size of rash. Firm in center of rash.   No fever, no headache, feels well otherwise.   tx to rash - peroxide, then neosporin on bandage.   Patient Active Problem List   Diagnosis Date Noted  . Acute, but ill-defined, cerebrovascular disease 04/07/2014  . Murmur 12/16/2013  . Abnormal ECG 12/15/2012  . Palpitations   . HTN (hypertension)   . GERD (gastroesophageal reflux disease)   . Urinary retention   . Eczema   . COPD (chronic obstructive pulmonary disease)   . Erectile dysfunction   . Meralgia paraesthetica    Past Medical History  Diagnosis Date  . Palpitations   . HTN (hypertension)   . GERD (gastroesophageal reflux disease)   . Urinary retention   . Eczema   . COPD (chronic obstructive pulmonary disease)   . Erectile dysfunction   . Meralgia paraesthetica   . BPH (benign prostatic hyperplasia)   . Acute, but ill-defined, cerebrovascular disease 04/07/2014  . HOH (hard of hearing)   . Meningioma     Right frontal   Past Surgical History  Procedure Laterality Date  . Cardiac catheterization    . Cataract extraction  2013    both  . Prostatectomy  1997  . Hernia repair  2010    Bilateral  . Gum surgery  1999  . Colonoscopy w/ polypectomy     Allergies  Allergen Reactions  . Procardia [Nifedipine]     Headaches    Prior to Admission medications   Medication Sig Start Date End Date Taking? Authorizing Provider  amLODipine (NORVASC) 5 MG tablet Take 1 tablet by mouth Daily. 11/20/12  Yes Historical Provider, MD  clopidogrel (PLAVIX) 75 MG tablet Take 1 tablet (75 mg total) by mouth daily. 04/07/14  Yes Kathrynn Ducking, MD    dorzolamide-timolol (COSOPT) 22.3-6.8 MG/ML ophthalmic solution as directed. 10/26/12  Yes Historical Provider, MD  pantoprazole (PROTONIX) 40 MG tablet Take 1 tablet by mouth Daily. 11/20/12  Yes Historical Provider, MD  Pramoxine-Dimethicone (GOLD BOND INTENSIVE HEALING) 1-6 % CREA Apply 1 application topically as needed.   Yes Historical Provider, MD  terazosin (HYTRIN) 10 MG capsule Take 1 tablet by mouth Daily. 11/21/12  Yes Historical Provider, MD  triamcinolone cream (KENALOG) 0.1 % Apply 0.1 % topically as needed. 01/07/14   Historical Provider, MD   History   Social History  . Marital Status: Divorced    Spouse Name: N/A    Number of Children: 2  . Years of Education: Ph. D   Occupational History  . Retired    Social History Main Topics  . Smoking status: Former Smoker -- 0.20 packs/day for 40 years    Types: Cigarettes  . Smokeless tobacco: Never Used     Comment: quit 2000  . Alcohol Use: Yes     Comment: wine daily 1-2 glasses  . Drug Use: No  . Sexual Activity: Not on file   Other Topics Concern  . Not on file   Social History Narrative  . No narrative on file  Review of Systems  Constitutional: Negative for fever, chills and fatigue.  Skin: Positive for rash and wound.  Neurological: Negative for headaches.       Objective:   Physical Exam  Vitals reviewed. Constitutional: He is oriented to person, place, and time. He appears well-developed and well-nourished. No distress.  Pulmonary/Chest: Effort normal.  Neurological: He is alert and oriented to person, place, and time.  Skin:     Psychiatric: He has a normal mood and affect. His behavior is normal.   Filed Vitals:   06/26/14 1641  BP: 130/80  Pulse: 69  Temp: 98.1 F (36.7 C)  TempSrc: Oral  Resp: 16  Height: 5\' 10"  (1.778 m)  Weight: 157 lb (71.215 kg)  SpO2: 96%    Tick brought to clinic - engorged, approx 72mm, with few light markings - appears to be dog tick. Head intact.       Assessment & Plan:   Jared Neal is a 80 y.o. male Tinea cruris - Plan: clotrimazole (LOTRIMIN) 1 % cream BID until resolved, then cont 1 week.   Tick bite of thigh, right, initial encounter  - no systemic sx's. Appears to be dog tick, and no target lesion or apparent cellulitis. Suspect irritated area may have been from removal of tick with few bruised appearing areas. Deferred abx's at this time, but if any increase in size or worsening - rtc for eval. Understanding expressed.   Meds ordered this encounter  Medications  . clotrimazole (LOTRIMIN) 1 % cream    Sig: Apply 1 application topically 2 (two) times daily.    Dispense:  30 g    Refill:  0   Patient Instructions  For the rash on groin - this appears to be from tinea cruris (jock itch).  Apply the clotrimazole to affected area for 2-3 weeks, or 1 week after rash resolves.   For the tick bite, the reddened are may be from local irritation where tick was attached and some bruising from removal.  If any spread of redness, increased pain or swelling, or other new symptoms - return for recehck and to consider antibiotics.  See more info below.   Return to the clinic or go to the nearest emergency room if any of your symptoms worsen or new symptoms occur.  Tick Bite Information Ticks are insects that attach themselves to the skin and draw blood for food. There are various types of ticks. Common types include wood ticks and deer ticks. Most ticks live in shrubs and grassy areas. Ticks can climb onto your body when you make contact with leaves or grass where the tick is waiting. The most common places on the body for ticks to attach themselves are the scalp, neck, armpits, waist, and groin. Most tick bites are harmless, but sometimes ticks carry germs that cause diseases. These germs can be spread to a person during the tick's feeding process. The chance of a disease spreading through a tick bite depends on:   The type of  tick.  Time of year.   How long the tick is attached.   Geographic location.  HOW CAN YOU PREVENT TICK BITES? Take these steps to help prevent tick bites when you are outdoors:  Wear protective clothing. Long sleeves and long pants are best.   Wear white clothes so you can see ticks more easily.  Tuck your pant legs into your socks.   If walking on a trail, stay in the middle of the trail to avoid  brushing against bushes.  Avoid walking through areas with long grass.  Put insect repellent on all exposed skin and along boot tops, pant legs, and sleeve cuffs.   Check clothing, hair, and skin repeatedly and before going inside.   Brush off any ticks that are not attached.  Take a shower or bath as soon as possible after being outdoors.  WHAT IS THE PROPER WAY TO REMOVE A TICK? Ticks should be removed as soon as possible to help prevent diseases caused by tick bites. 1. If latex gloves are available, put them on before trying to remove a tick.  2. Using fine-point tweezers, grasp the tick as close to the skin as possible. You may also use curved forceps or a tick removal tool. Grasp the tick as close to its head as possible. Avoid grasping the tick on its body. 3. Pull gently with steady upward pressure until the tick lets go. Do not twist the tick or jerk it suddenly. This may break off the tick's head or mouth parts. 4. Do not squeeze or crush the tick's body. This could force disease-carrying fluids from the tick into your body.  5. After the tick is removed, wash the bite area and your hands with soap and water or other disinfectant such as alcohol. 6. Apply a small amount of antiseptic cream or ointment to the bite site.  7. Wash and disinfect any instruments that were used.  Do not try to remove a tick by applying a hot match, petroleum jelly, or fingernail polish to the tick. These methods do not work and may increase the chances of disease being spread from the  tick bite.  WHEN SHOULD YOU SEEK MEDICAL CARE? Contact your health care provider if you are unable to remove a tick from your skin or if a part of the tick breaks off and is stuck in the skin.  After a tick bite, you need to be aware of signs and symptoms that could be related to diseases spread by ticks. Contact your health care provider if you develop any of the following in the days or weeks after the tick bite:  Unexplained fever.  Rash. A circular rash that appears days or weeks after the tick bite may indicate the possibility of Lyme disease. The rash may resemble a target with a bull's-eye and may occur at a different part of your body than the tick bite.  Redness and swelling in the area of the tick bite.   Tender, swollen lymph glands.   Diarrhea.   Weight loss.   Cough.   Fatigue.   Muscle, joint, or bone pain.   Abdominal pain.   Headache.   Lethargy or a change in your level of consciousness.  Difficulty walking or moving your legs.   Numbness in the legs.   Paralysis.  Shortness of breath.   Confusion.   Repeated vomiting.  Document Released: 12/14/2000 Document Revised: 10/07/2013 Document Reviewed: 05/27/2013 [facility] Patient Information 2015 [place], [place]. This information is not intended to replace advice given to you by your health care provider. Make sure you discuss any questions you have with your health care provider.

## 2014-06-26 NOTE — Patient Instructions (Signed)
For the rash on groin - this appears to be from tinea cruris (jock itch).  Apply the clotrimazole to affected area for 2-3 weeks, or 1 week after rash resolves.   For the tick bite, the reddened are may be from local irritation where tick was attached and some bruising from removal.  If any spread of redness, increased pain or swelling, or other new symptoms - return for recehck and to consider antibiotics.  See more info below.   Return to the clinic or go to the nearest emergency room if any of your symptoms worsen or new symptoms occur.  Tick Bite Information Ticks are insects that attach themselves to the skin and draw blood for food. There are various types of ticks. Common types include wood ticks and deer ticks. Most ticks live in shrubs and grassy areas. Ticks can climb onto your body when you make contact with leaves or grass where the tick is waiting. The most common places on the body for ticks to attach themselves are the scalp, neck, armpits, waist, and groin. Most tick bites are harmless, but sometimes ticks carry germs that cause diseases. These germs can be spread to a person during the tick's feeding process. The chance of a disease spreading through a tick bite depends on:   The type of tick.  Time of year.   How long the tick is attached.   Geographic location.  HOW CAN YOU PREVENT TICK BITES? Take these steps to help prevent tick bites when you are outdoors:  Wear protective clothing. Long sleeves and long pants are best.   Wear white clothes so you can see ticks more easily.  Tuck your pant legs into your socks.   If walking on a trail, stay in the middle of the trail to avoid brushing against bushes.  Avoid walking through areas with long grass.  Put insect repellent on all exposed skin and along boot tops, pant legs, and sleeve cuffs.   Check clothing, hair, and skin repeatedly and before going inside.   Brush off any ticks that are not  attached.  Take a shower or bath as soon as possible after being outdoors.  WHAT IS THE PROPER WAY TO REMOVE A TICK? Ticks should be removed as soon as possible to help prevent diseases caused by tick bites. 1. If latex gloves are available, put them on before trying to remove a tick.  2. Using fine-point tweezers, grasp the tick as close to the skin as possible. You may also use curved forceps or a tick removal tool. Grasp the tick as close to its head as possible. Avoid grasping the tick on its body. 3. Pull gently with steady upward pressure until the tick lets go. Do not twist the tick or jerk it suddenly. This may break off the tick's head or mouth parts. 4. Do not squeeze or crush the tick's body. This could force disease-carrying fluids from the tick into your body.  5. After the tick is removed, wash the bite area and your hands with soap and water or other disinfectant such as alcohol. 6. Apply a small amount of antiseptic cream or ointment to the bite site.  7. Wash and disinfect any instruments that were used.  Do not try to remove a tick by applying a hot match, petroleum jelly, or fingernail polish to the tick. These methods do not work and may increase the chances of disease being spread from the tick bite.  WHEN SHOULD YOU SEEK  MEDICAL CARE? Contact your health care provider if you are unable to remove a tick from your skin or if a part of the tick breaks off and is stuck in the skin.  After a tick bite, you need to be aware of signs and symptoms that could be related to diseases spread by ticks. Contact your health care provider if you develop any of the following in the days or weeks after the tick bite:  Unexplained fever.  Rash. A circular rash that appears days or weeks after the tick bite may indicate the possibility of Lyme disease. The rash may resemble a target with a bull's-eye and may occur at a different part of your body than the tick bite.  Redness and swelling  in the area of the tick bite.   Tender, swollen lymph glands.   Diarrhea.   Weight loss.   Cough.   Fatigue.   Muscle, joint, or bone pain.   Abdominal pain.   Headache.   Lethargy or a change in your level of consciousness.  Difficulty walking or moving your legs.   Numbness in the legs.   Paralysis.  Shortness of breath.   Confusion.   Repeated vomiting.  Document Released: 12/14/2000 Document Revised: 10/07/2013 Document Reviewed: 05/27/2013 [facility] Patient Information 2015 [place], [place]. This information is not intended to replace advice given to you by your health care provider. Make sure you discuss any questions you have with your health care provider.

## 2014-06-28 DIAGNOSIS — Z961 Presence of intraocular lens: Secondary | ICD-10-CM | POA: Diagnosis not present

## 2014-09-07 ENCOUNTER — Ambulatory Visit (INDEPENDENT_AMBULATORY_CARE_PROVIDER_SITE_OTHER): Payer: Medicare Other | Admitting: Family Medicine

## 2014-09-07 VITALS — BP 120/80 | HR 85 | Temp 97.6°F | Resp 16 | Ht 70.0 in | Wt 153.4 lb

## 2014-09-07 DIAGNOSIS — Z8679 Personal history of other diseases of the circulatory system: Secondary | ICD-10-CM

## 2014-09-07 DIAGNOSIS — R109 Unspecified abdominal pain: Secondary | ICD-10-CM

## 2014-09-07 DIAGNOSIS — R1030 Lower abdominal pain, unspecified: Secondary | ICD-10-CM

## 2014-09-07 DIAGNOSIS — K219 Gastro-esophageal reflux disease without esophagitis: Secondary | ICD-10-CM | POA: Diagnosis not present

## 2014-09-07 LAB — POCT URINALYSIS DIPSTICK
Bilirubin, UA: NEGATIVE
Glucose, UA: NEGATIVE
Ketones, UA: NEGATIVE
Leukocytes, UA: NEGATIVE
Nitrite, UA: NEGATIVE
PROTEIN UA: NEGATIVE
SPEC GRAV UA: 1.015
UROBILINOGEN UA: 0.2
pH, UA: 5

## 2014-09-07 LAB — POCT UA - MICROSCOPIC ONLY
CASTS, UR, LPF, POC: NEGATIVE
CRYSTALS, UR, HPF, POC: NEGATIVE
MUCUS UA: NEGATIVE
Yeast, UA: NEGATIVE

## 2014-09-07 LAB — POCT CBC
Granulocyte percent: 56.8 %G (ref 37–80)
HEMATOCRIT: 46.3 % (ref 43.5–53.7)
HEMOGLOBIN: 15 g/dL (ref 14.1–18.1)
Lymph, poc: 2.7 (ref 0.6–3.4)
MCH: 27.3 pg (ref 27–31.2)
MCHC: 32.5 g/dL (ref 31.8–35.4)
MCV: 84 fL (ref 80–97)
MID (CBC): 0.5 (ref 0–0.9)
MPV: 7 fL (ref 0–99.8)
POC Granulocyte: 4.1 (ref 2–6.9)
POC LYMPH PERCENT: 36.8 %L (ref 10–50)
POC MID %: 6.4 %M (ref 0–12)
Platelet Count, POC: 186 10*3/uL (ref 142–424)
RBC: 5.51 M/uL (ref 4.69–6.13)
RDW, POC: 14.4 %
WBC: 7.3 10*3/uL (ref 4.6–10.2)

## 2014-09-07 LAB — COMPREHENSIVE METABOLIC PANEL
ALBUMIN: 4.1 g/dL (ref 3.5–5.2)
ALK PHOS: 92 U/L (ref 39–117)
ALT: 11 U/L (ref 0–53)
AST: 14 U/L (ref 0–37)
BILIRUBIN TOTAL: 1.1 mg/dL (ref 0.2–1.2)
BUN: 29 mg/dL — AB (ref 6–23)
CO2: 28 meq/L (ref 19–32)
Calcium: 9.1 mg/dL (ref 8.4–10.5)
Chloride: 103 mEq/L (ref 96–112)
Creat: 1.23 mg/dL (ref 0.50–1.35)
GLUCOSE: 99 mg/dL (ref 70–99)
Potassium: 4.2 mEq/L (ref 3.5–5.3)
Sodium: 137 mEq/L (ref 135–145)
Total Protein: 6 g/dL (ref 6.0–8.3)

## 2014-09-07 NOTE — Progress Notes (Signed)
Subjective:    Patient ID: Jared Neal, male    DOB: 07-24-1929, 80 y.o.   MRN: [id number]  HPI Ozzie Demore is a 80 y.o. male Hx of GERD, PUD, HTN, other medical problems as below. PCP: Horatio Pel, MD  Here with c/o abdominal pain. "discomfort" with intermittent nausea, that comes and goes over past year.  Takes ranitidine and pantoprazole for GERD, and possible PUD (no endoscopy, just possibly diagnosed by sx's).  Pain was worse last night - upper and lower abdomen, but upper stomach/esophagus area yesterday during the day. More gurgling. No vomiting, no fever. Stool normal- not hard or diarrhea, no dark/tarry stools. Normal colonoscopy 3 years ago other than polyp that was removed. No known hx of diverticulosis. Urinating normally. Notes about an hour or so after eating. PCP prescribed ranitidine and protonix. may have had some initial improvement, but not recently with this med.  Sometimes a week or two goes by without symptoms.   Feels a little better today, but still some lower abd soreness - similar to prior abdominal sx's over past year that has been followed by his PCP.   Hx of BPH, hx of  Prostate surgery about 12 years ago - ? TURP. Followed by urology - seen within 1 year, stable, no new urinary sx's.   Scheduled to fly to [place] in 4 days.    Had been taking plavix after small silent stroke seen on MRI earlier this year. Stopped PLavix few months later. Didn't want to take this.  Plans on discussing with PCP. Intolerant to ASA from GI side effects. No known hx of CAD, just suspected CVD with silent stroke (without any residual deficits).    Patient Active Problem List   Diagnosis Date Noted  . Acute, but ill-defined, cerebrovascular disease 04/07/2014  . Murmur 12/16/2013  . Abnormal ECG 12/15/2012  . Palpitations   . HTN (hypertension)   . GERD (gastroesophageal reflux disease)   . Urinary retention   . Eczema   . COPD (chronic obstructive pulmonary  disease)   . Erectile dysfunction   . Meralgia paraesthetica    Past Medical History  Diagnosis Date  . Palpitations   . HTN (hypertension)   . GERD (gastroesophageal reflux disease)   . Urinary retention   . Eczema   . COPD (chronic obstructive pulmonary disease)   . Erectile dysfunction   . Meralgia paraesthetica   . BPH (benign prostatic hyperplasia)   . Acute, but ill-defined, cerebrovascular disease 04/07/2014  . HOH (hard of hearing)   . Meningioma     Right frontal   Past Surgical History  Procedure Laterality Date  . Cardiac catheterization    . Cataract extraction  2013    both  . Prostatectomy  1997  . Hernia repair  2010    Bilateral  . Gum surgery  1999  . Colonoscopy w/ polypectomy     Allergies  Allergen Reactions  . Procardia [Nifedipine]     Headaches    Prior to Admission medications   Medication Sig Start Date End Date Taking? Authorizing Provider  RANITIDINE HCL PO Take by mouth.   Yes Historical Provider, MD  amLODipine (NORVASC) 5 MG tablet Take 1 tablet by mouth Daily. 11/20/12   Historical Provider, MD  clopidogrel (PLAVIX) 75 MG tablet Take 1 tablet (75 mg total) by mouth daily. 04/07/14   Kathrynn Ducking, MD  clotrimazole (LOTRIMIN) 1 % cream Apply 1 application topically 2 (two) times daily. 06/26/14  Wendie Agreste, MD  dorzolamide-timolol (COSOPT) 22.3-6.8 MG/ML ophthalmic solution as directed. 10/26/12   Historical Provider, MD  pantoprazole (PROTONIX) 40 MG tablet Take 1 tablet by mouth Daily. 11/20/12   Historical Provider, MD  Pramoxine-Dimethicone (GOLD BOND INTENSIVE HEALING) 1-6 % CREA Apply 1 application topically as needed.    Historical Provider, MD  terazosin (HYTRIN) 10 MG capsule Take 1 tablet by mouth Daily. 11/21/12   Historical Provider, MD  triamcinolone cream (KENALOG) 0.1 % Apply 0.1 % topically as needed. 01/07/14   Historical Provider, MD   History   Social History  . Marital Status: Divorced    Spouse Name: N/A     Number of Children: 2  . Years of Education: Ph. D   Occupational History  . Retired    Social History Main Topics  . Smoking status: Former Smoker -- 0.20 packs/day for 40 years    Types: Cigarettes  . Smokeless tobacco: Never Used     Comment: quit 2000  . Alcohol Use: Yes     Comment: wine daily 1-2 glasses  . Drug Use: No  . Sexual Activity: Not on file   Other Topics Concern  . Not on file   Social History Narrative  . No narrative on file    Review of Systems  Constitutional: Negative for fever and chills.  Respiratory: Negative for chest tightness.   Cardiovascular: Negative for chest pain.  Gastrointestinal: Positive for abdominal pain. Negative for nausea, vomiting, diarrhea, constipation, blood in stool, abdominal distention and anal bleeding.  Genitourinary: Negative for dysuria, urgency, frequency, hematuria, decreased urine volume and difficulty urinating.  Musculoskeletal: Negative for back pain.  Skin: Negative for rash.       Objective:   Physical Exam  Vitals reviewed. Constitutional: He is oriented to person, place, and time. He appears well-developed and well-nourished.  HENT:  Head: Normocephalic and atraumatic.  Eyes: EOM are normal. Pupils are equal, round, and reactive to light.  Neck: No JVD present. Carotid bruit is not present.  Cardiovascular: Normal rate, regular rhythm and normal heart sounds.   No murmur heard. Pulmonary/Chest: Effort normal and breath sounds normal. He has no rales.  Abdominal: Soft. Bowel sounds are normal. He exhibits no distension and no pulsatile midline mass. There is no tenderness. There is no rebound, no guarding, no CVA tenderness, no tenderness at McBurney's point and negative Murphy's sign.  Musculoskeletal: He exhibits no edema.  Neurological: He is alert and oriented to person, place, and time.  Skin: Skin is warm and dry.  Psychiatric: He has a normal mood and affect.   Filed Vitals:   09/07/14 0939  BP:  120/80  Pulse: 85  Temp: 97.6 F (36.4 C)  TempSrc: Oral  Resp: 16  Height: 5\' 10"  (1.778 m)  Weight: 153 lb 6.4 oz (69.582 kg)  SpO2: 97%     Results for orders placed in visit on 09/07/14  POCT CBC      Result Value Ref Range   WBC 7.3  4.6 - 10.2 K/uL   Lymph, poc 2.7  0.6 - 3.4   POC LYMPH PERCENT 36.8  10 - 50 %L   MID (cbc) 0.5  0 - 0.9   POC MID % 6.4  0 - 12 %M   POC Granulocyte 4.1  2 - 6.9   Granulocyte percent 56.8  37 - 80 %G   RBC 5.51  4.69 - 6.13 M/uL   Hemoglobin 15.0  14.1 - 18.1 g/dL  HCT, POC 46.3  43.5 - 53.7 %   MCV 84.0  80 - 97 fL   MCH, POC 27.3  27 - 31.2 pg   MCHC 32.5  31.8 - 35.4 g/dL   RDW, POC 14.4     Platelet Count, POC 186  142 - 424 K/uL   MPV 7.0  0 - 99.8 fL  POCT UA - MICROSCOPIC ONLY      Result Value Ref Range   WBC, Ur, HPF, POC 0-2     RBC, urine, microscopic 0-3     Bacteria, U Microscopic small     Mucus, UA neg     Epithelial cells, urine per micros 1-2     Crystals, Ur, HPF, POC neg     Casts, Ur, LPF, POC neg     Yeast, UA neg    POCT URINALYSIS DIPSTICK      Result Value Ref Range   Color, UA dark yellow     Clarity, UA clear     Glucose, UA neg     Bilirubin, UA neg     Ketones, UA neg     Spec Grav, UA 1.015     Blood, UA trace-intact     pH, UA 5.0     Protein, UA neg     Urobilinogen, UA 0.2     Nitrite, UA neg     Leukocytes, UA Negative        Assessment & Plan:   Tupac Tinnon is a 80 y.o. male Lower abdominal pain - Plan: POCT CBC, POCT UA - Microscopic Only, POCT urinalysis dipstick, Comprehensive metabolic panel  Gastroesophageal reflux disease, esophagitis presence not specified  hx of GERD, possibly PUD by hx, with recurrent abdominal pain.  Some initial improvement with PPI/H2 blocker, but now without much relief. Worsening last night, but improved today, and nontender on exam. DDX of GERD/gastritis, PUD, gallbladder source with postrandial association, or less likely chronic mesenteric  ischemia (but with age, hx of HTN, CVD, this is also in differential).   Discussed with gastroenterologist on call - will start with abdominal ultrasound, then if no source found for intermittent pain, plan to refer to Dr. Ardis Hughs (GI) for eval.  Discussed with pt and ER/RTC precautions discussed.   Advised to discuss plavix with PCP as hx of CVD.   Patient Instructions   As pain improved, and tests in office reassuring, no new medicine at this time, but I will talk to a gastroenterologist to determine further workup.  Continue usual medications. Discuss the plavix or other blood thinner need with your primary provider or cardiologist. If pain returns or worsens- return here or emergency room.   Return to the clinic or go to the nearest emergency room if any of your symptoms worsen or new symptoms occur.  Abdominal Pain Many things can cause abdominal pain. Usually, abdominal pain is not caused by a disease and will improve without treatment. It can often be observed and treated at home. Your health care provider will do a physical exam and possibly order blood tests and X-rays to help determine the seriousness of your pain. However, in many cases, more time must pass before a clear cause of the pain can be found. Before that point, your health care provider may not know if you need more testing or further treatment. HOME CARE INSTRUCTIONS  Monitor your abdominal pain for any changes. The following actions may help to alleviate any discomfort you are experiencing:  Only take over-the-counter  or prescription medicines as directed by your health care provider.  Do not take laxatives unless directed to do so by your health care provider.  Try a clear liquid diet (broth, tea, or water) as directed by your health care provider. Slowly move to a bland diet as tolerated. SEEK MEDICAL CARE IF:  You have unexplained abdominal pain.  You have abdominal pain associated with nausea or diarrhea.  You  have pain when you urinate or have a bowel movement.  You experience abdominal pain that wakes you in the night.  You have abdominal pain that is worsened or improved by eating food.  You have abdominal pain that is worsened with eating fatty foods.  You have a fever. SEEK IMMEDIATE MEDICAL CARE IF:   Your pain does not go away within 2 hours.  You keep throwing up (vomiting).  Your pain is felt only in portions of the abdomen, such as the right side or the left lower portion of the abdomen.  You pass bloody or black tarry stools. MAKE SURE YOU:  Understand these instructions.   Will watch your condition.   Will get help right away if you are not doing well or get worse.  Document Released: 09/26/2005 Document Revised: 12/22/2013 Document Reviewed: 08/26/2013 [facility] Patient Information 2015 [place], [place]. This information is not intended to replace advice given to you by your health care provider. Make sure you discuss any questions you have with your health care provider.

## 2014-09-07 NOTE — Patient Instructions (Signed)
As pain improved, and tests in office reassuring, no new medicine at this time, but I will talk to a gastroenterologist to determine further workup.  Continue usual medications. Discuss the plavix or other blood thinner need with your primary provider or cardiologist. If pain returns or worsens- return here or emergency room.   Return to the clinic or go to the nearest emergency room if any of your symptoms worsen or new symptoms occur.  Abdominal Pain Many things can cause abdominal pain. Usually, abdominal pain is not caused by a disease and will improve without treatment. It can often be observed and treated at home. Your health care provider will do a physical exam and possibly order blood tests and X-rays to help determine the seriousness of your pain. However, in many cases, more time must pass before a clear cause of the pain can be found. Before that point, your health care provider may not know if you need more testing or further treatment. HOME CARE INSTRUCTIONS  Monitor your abdominal pain for any changes. The following actions may help to alleviate any discomfort you are experiencing:  Only take over-the-counter or prescription medicines as directed by your health care provider.  Do not take laxatives unless directed to do so by your health care provider.  Try a clear liquid diet (broth, tea, or water) as directed by your health care provider. Slowly move to a bland diet as tolerated. SEEK MEDICAL CARE IF:  You have unexplained abdominal pain.  You have abdominal pain associated with nausea or diarrhea.  You have pain when you urinate or have a bowel movement.  You experience abdominal pain that wakes you in the night.  You have abdominal pain that is worsened or improved by eating food.  You have abdominal pain that is worsened with eating fatty foods.  You have a fever. SEEK IMMEDIATE MEDICAL CARE IF:   Your pain does not go away within 2 hours.  You keep throwing up  (vomiting).  Your pain is felt only in portions of the abdomen, such as the right side or the left lower portion of the abdomen.  You pass bloody or black tarry stools. MAKE SURE YOU:  Understand these instructions.   Will watch your condition.   Will get help right away if you are not doing well or get worse.  Document Released: 09/26/2005 Document Revised: 12/22/2013 Document Reviewed: 08/26/2013 [facility] Patient Information 2015 [place], [place]. This information is not intended to replace advice given to you by your health care provider. Make sure you discuss any questions you have with your health care provider.

## 2014-09-08 ENCOUNTER — Ambulatory Visit (HOSPITAL_COMMUNITY)
Admission: RE | Admit: 2014-09-08 | Discharge: 2014-09-08 | Disposition: A | Discharge status: Home / Self Care | Payer: Medicare Other | Source: Ambulatory Visit | Attending: Family Medicine | Admitting: Family Medicine

## 2014-09-08 DIAGNOSIS — Q618 Other cystic kidney diseases: Secondary | ICD-10-CM | POA: Diagnosis not present

## 2014-09-08 DIAGNOSIS — R1030 Lower abdominal pain, unspecified: Secondary | ICD-10-CM

## 2014-09-08 DIAGNOSIS — N281 Cyst of kidney, acquired: Secondary | ICD-10-CM | POA: Diagnosis not present

## 2014-09-08 DIAGNOSIS — K8020 Calculus of gallbladder without cholecystitis without obstruction: Secondary | ICD-10-CM | POA: Diagnosis not present

## 2014-09-08 DIAGNOSIS — R109 Unspecified abdominal pain: Secondary | ICD-10-CM | POA: Diagnosis present

## 2014-09-08 DIAGNOSIS — K219 Gastro-esophageal reflux disease without esophagitis: Secondary | ICD-10-CM

## 2014-09-09 ENCOUNTER — Telehealth: Payer: Self-pay

## 2014-09-09 NOTE — Telephone Encounter (Signed)
Please advise results to report to patient.

## 2014-09-09 NOTE — Telephone Encounter (Signed)
[place] - Patient was referred for ultrasound and wants to know if you have been able to go over those results yet.  Please call asap [phone]

## 2014-09-10 ENCOUNTER — Ambulatory Visit (INDEPENDENT_AMBULATORY_CARE_PROVIDER_SITE_OTHER): Payer: Medicare Other | Admitting: Emergency Medicine

## 2014-09-10 VITALS — BP 100/60 | HR 72 | Temp 97.7°F | Resp 16 | Ht 70.0 in | Wt 153.0 lb

## 2014-09-10 DIAGNOSIS — Z7189 Other specified counseling: Secondary | ICD-10-CM | POA: Diagnosis not present

## 2014-09-10 DIAGNOSIS — R1030 Lower abdominal pain, unspecified: Secondary | ICD-10-CM

## 2014-09-10 DIAGNOSIS — R109 Unspecified abdominal pain: Secondary | ICD-10-CM

## 2014-09-10 DIAGNOSIS — Z7184 Encounter for health counseling related to travel: Secondary | ICD-10-CM

## 2014-09-10 NOTE — Progress Notes (Signed)
   Subjective:  This chart was scribed for Arlyss Queen, MD by Terressa Koyanagi, ED Scribe. This patient was seen in room 9 and the patient's care was started at 11:02 AM.      Patient ID: [name], male    DOB: August 26, 1929, 80 y.o.   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 male, with medical hx noted below, who presents to the [facility] for a note excusing him from air travel tomorrow. Pt reports that he is scheduled to fly to [place] to visit his son tomorrow; however, he is uncomfortable flying because he is presently undergoing evaluation for abd pain. Specifically, pt reports that he was seen at the clinic by Dr. Carlota Raspberry on 09/07/14 for the same. Pt reports Dr. Carlota Raspberry put in an order for an ultrasound, which pt completed, for the same. Pt reports that he still does not have the results from the ultrasound and does not wish to travel to [place] until he gets the results from the ultrasound.   Past Medical History  Diagnosis Date  . Palpitations   . HTN (hypertension)   . GERD (gastroesophageal reflux disease)   . Urinary retention   . Eczema   . COPD (chronic obstructive pulmonary disease)   . Erectile dysfunction   . Meralgia paraesthetica   . BPH (benign prostatic hyperplasia)   . Acute, but ill-defined, cerebrovascular disease 04/07/2014  . HOH (hard of hearing)   . Meningioma     Right frontal    Review of Systems  Constitutional: Negative for fever and chills.  Gastrointestinal: Positive for abdominal pain.  Psychiatric/Behavioral: Negative for confusion.       Objective:   Physical Exam  CONSTITUTIONAL: Well developed/well nourished HEAD: Normocephalic/atraumatic EYES: EOMI/PERRL ENMT: Mucous membranes moist NECK: supple no meningeal signs SPINE:entire spine nontender CV: S1/S2 noted, no murmurs/rubs/gallops noted; irregular heartbeat LUNGS: Lungs are clear to auscultation bilaterally, no apparent  distress ABDOMEN: soft, nontender, no rebound or guarding; hyperactive bowel sounds GU:no cva tenderness NEURO: Pt is awake/alert, moves all extremitiesx4 EXTREMITIES: pulses normal, full ROM SKIN: warm, color normal PSYCH: no abnormalities of mood noted     Assessment & Plan:  X-ray shows gallstones with thickening of the gallbladder wall. He also has cysts present in the kidneys. He wants to discuss this with his family doctor Dr. Shelia Media. I told him that if he were to develop fever or increasing abdominal pain he should go to the emergency room. He does not want to talk to a surgeon at the present time but would prefer to speak with his family doctor. He was given a note not to fly to I personally performed the services described in this documentation, which was scribed in my presence. The recorded information has been reviewed and is accurate.

## 2014-09-10 NOTE — Telephone Encounter (Signed)
Pt called again requesting lab results to be read to him.

## 2014-09-14 DIAGNOSIS — K8020 Calculus of gallbladder without cholecystitis without obstruction: Secondary | ICD-10-CM | POA: Diagnosis not present

## 2014-09-14 DIAGNOSIS — Q619 Cystic kidney disease, unspecified: Secondary | ICD-10-CM | POA: Diagnosis not present

## 2014-09-14 DIAGNOSIS — Z23 Encounter for immunization: Secondary | ICD-10-CM | POA: Diagnosis not present

## 2014-09-15 ENCOUNTER — Telehealth: Payer: Self-pay | Admitting: Emergency Medicine

## 2014-09-15 NOTE — Telephone Encounter (Signed)
Patient dropped of Attending Physician Statement to be completed by Dr. Everlene Farrier. Patient states that he had to cancel a flight due to his illness. In order to get a refund on the flight he needs the Attending Physician Statement completed. I have placed this in Daub's box on 09/15/2014 for completion. Please call patient when ready for pick up.   Please note: THIS IS NOT DISABILITIES. DO NOT PLACE IN DISABILITIES TRAY   [phone]

## 2014-09-16 ENCOUNTER — Other Ambulatory Visit: Payer: Self-pay

## 2014-09-16 NOTE — Telephone Encounter (Signed)
Dr. Everlene Farrier completed forms. Called and left vm informing pt he can pick the form up.

## 2014-09-27 DIAGNOSIS — N281 Cyst of kidney, acquired: Secondary | ICD-10-CM | POA: Diagnosis not present

## 2014-10-07 DIAGNOSIS — K8020 Calculus of gallbladder without cholecystitis without obstruction: Secondary | ICD-10-CM | POA: Diagnosis not present

## 2014-10-15 ENCOUNTER — Other Ambulatory Visit (INDEPENDENT_AMBULATORY_CARE_PROVIDER_SITE_OTHER): Payer: Self-pay | Admitting: General Surgery

## 2014-10-15 ENCOUNTER — Encounter (INDEPENDENT_AMBULATORY_CARE_PROVIDER_SITE_OTHER): Payer: Self-pay | Admitting: General Surgery

## 2014-10-15 ENCOUNTER — Telehealth (INDEPENDENT_AMBULATORY_CARE_PROVIDER_SITE_OTHER): Payer: Self-pay

## 2014-10-15 NOTE — Progress Notes (Unsigned)
Patient ID: Jared Neal, male   DOB: 1929-06-23, 80 y.o.   MRN: [id number]  Jared Neal 10/07/2014 1:22 PM Location: [facility] Patient #: [id number] DOB: 12/08/1929 Divorced / Language: Jared Neal / Race: Undefined Male  History of Present Illness Jared Hollingshead MD; 10/07/2014 2:44 PM) Patient words: evalgallbladder.  The patient is a 80 year old male   Note:He is referred by Dr. Shelia Media because of epigastric pain and gallstones. About 6 weeks ago, he had an episode of epigastric discomfort that lasted approximately 6 hours.He had some episodes after that. They seem to be related to drinking coffee with cream or drinking a glass of wine. An abdominal ultrasound demonstrated multiple gallstones with slight gallbladder wall thickening and edema. The common bile duct diameter was 8 mm which is normal for age .Some cysts were noted on his kidney as well. He has stopped drinking wine and is using less cream and has coffee. His symptoms have improved. He initially lost 6 pounds, but has gained all of this back.   Allergies (Jared Neal, CMA; 10/07/2014 1:22 PM) Procardia *CALCIUM CHANNEL BLOCKERS*  Medication History (Jared Neal, CMA; 10/07/2014 1:23 PM) AmLODIPine Besylate (5MG  Tablet, Oral daily) Active. Pantoprazole Sodium (40MG  Tablet DR, Oral daily) Active. Ranitidine HCl (150MG  Tablet, Oral as needed) Active. Triamcinolone Acetonide (0.1% Cream, External as needed) Active. Clotrimazole (1% Cream, External as needed) Active.  Review of Systems ([place]; 10/07/2014 1:49 PM) General Present- Weight Loss. Not Present- Appetite Loss, Chills, Fatigue, Fever, Night Sweats and Weight Gain. Skin Not Present- Change in Wart/Mole, Dryness, Hives, Jaundice, New Lesions, Non-Healing Wounds, Rash and Ulcer. HEENT Present- Hearing Loss and Ringing in the Ears. Not Present- Earache, Hoarseness, Nose Bleed, Oral Ulcers, Seasonal Allergies, Sinus Pain, Sore Throat,  Visual Disturbances, Wears glasses/contact lenses and Yellow Eyes. Breast Not Present- Breast Mass, Breast Pain, Nipple Discharge and Skin Changes. Cardiovascular Present- Palpitations. Not Present- Chest Pain, Difficulty Breathing Lying Down, Leg Cramps, Rapid Heart Rate, Shortness of Breath and Swelling of Extremities. Gastrointestinal Present- Excessive gas. Not Present- Abdominal Pain, Bloating, Bloody Stool, Change in Bowel Habits, Chronic diarrhea, Constipation, Difficulty Swallowing, Gets full quickly at meals, Hemorrhoids, Indigestion, Nausea, Rectal Pain and Vomiting. Male Genitourinary Present- Change in Urinary Stream, Impotence and Nocturia. Not Present- Blood in Urine, Frequency, Painful Urination, Urgency and Urine Leakage. Musculoskeletal Not Present- Back Pain, Joint Pain, Joint Stiffness, Muscle Pain, Muscle Weakness and Swelling of Extremities. Neurological Not Present- Decreased Memory, Fainting, Headaches, Numbness, Seizures, Tingling, Tremor, Trouble walking and Weakness. Psychiatric Not Present- Anxiety, Bipolar, Change in Sleep Pattern, Depression, Fearful and Frequent crying. Endocrine Not Present- Cold Intolerance, Excessive Hunger, Hair Changes, Heat Intolerance, Hot flashes and New Diabetes. Hematology Not Present- Easy Bruising, Excessive bleeding, Gland problems, HIV and Persistent Infections.   Vitals (Jared Neal CMA; 10/07/2014 1:25 PM) 10/07/2014 1:24 PM Weight: 153 lb Height: 71in Body Surface Area: 1.86 m Body Mass Index: 21.34 kg/m Temp.: 97.22F(Temporal)  Pulse: 72 (Regular)  BP: 120/70 (Sitting, Left Arm, Standard)    Physical Exam Jared Hollingshead MD; 10/07/2014 2:44 PM) The physical exam findings are as follows: Note:General: Elderly in NAD. Pleasant and cooperative.  HEENT: [place]/AT, no facial masses  EYES: EOMI, no icterus  CV: RRR, no murmur, no JVD.  CHEST: Breath sounds equal and clear. Respirations nonlabored.  ABDOMEN: Soft,  nontender, nondistended, no masses, no organomegaly, active bowel sounds, no scars, no hernias.  NEUROLOGIC: Alert and oriented, answers questions appropriately, normal gait and station.  PSYCHIATRIC: Normal  mood, affect , and behavior.    Assessment & Plan Jared Hollingshead MD; 10/07/2014 2:47 PM) CHOLELITHIASIS WITHOUT CHOLECYSTITIS (574.20  K80.20) Impression: He has symptomatic cholelithiasis. We discussed laparoscopic cholecystectomy for this versus strictdiet control. He is somewhat interested in surgery.We'll need to talk to Dr. Johnsie Cancel about his cardiac risk prior to scheduling surgery. Would then speak with [name] after this. I have explained the procedure, risks, and aftercare of cholecystectomy. Risks include but are not limited to bleeding, infection, wound problems, anesthesia, diarrhea, bile leak, injury to common bile duct/liver/intestine. He seems to understand and agrees to proceed.   Addendum: Dr. Johnsie Cancel sent me a message stating that he has a normal ejection fraction and should be okay to proceed with the surgery. We will call him and discuss scheduling the operation.  Jared Neal, M.D.  10/15/14.

## 2014-10-15 NOTE — Telephone Encounter (Signed)
Pt made aware that Dr. Johnsie Cancel has cleared him for surgery.  He will need to keep his December 2015 f/u with Dr. Marcellus Scott.

## 2014-10-21 ENCOUNTER — Other Ambulatory Visit (INDEPENDENT_AMBULATORY_CARE_PROVIDER_SITE_OTHER): Payer: Self-pay | Admitting: General Surgery

## 2014-11-10 ENCOUNTER — Other Ambulatory Visit (HOSPITAL_COMMUNITY): Payer: Self-pay | Admitting: *Deleted

## 2014-11-10 NOTE — Pre-Procedure Instructions (Signed)
Ingvald Bussa  11/10/2014   Your procedure is scheduled on:  Friday, November 19, 2014 at 10:30 AM.   Report to [facility] Entrance "A" Admitting Office at 8:30 AM.   Call this number if you have problems the morning of surgery: [phone]   Remember:   Do not eat food or drink liquids after midnight Thursday, 11/18/14.   Take these medicines the morning of surgery with A SIP OF WATER: amLODipine (NORVASC), pantoprazole (PROTONIX), ranitidine (ZANTAC), terazosin (HYTRIN)   Do not wear jewelry.  Do not wear lotions, powders, or cologne. You may wear deodorant.  Men may shave face and neck.  Do not bring valuables to the hospital.  [facility] is not responsible                  for any belongings or valuables.               Contacts, dentures or bridgework may not be worn into surgery.  Leave suitcase in the car. After surgery it may be brought to your room.  For patients admitted to the hospital, discharge time is determined by your                treatment team.                                             Special Instructions: [facility] - Preparing for Surgery  Before surgery, you can play an important role.  Because skin is not sterile, your skin needs to be as free of germs as possible.  You can reduce the number of germs on you skin by washing with CHG (chlorahexidine gluconate) soap before surgery.  CHG is an antiseptic cleaner which kills germs and bonds with the skin to continue killing germs even after washing.  Please DO NOT use if you have an allergy to CHG or antibacterial soaps.  If your skin becomes reddened/irritated stop using the CHG and inform your nurse when you arrive at Short Stay.  Do not shave (including legs and underarms) for at least 48 hours prior to the first CHG shower.  You may shave your face.  Please follow these instructions carefully:   1.  Shower with CHG Soap the night before surgery and the                                morning of  Surgery.  2.  If you choose to wash your hair, wash your hair first as usual with your       normal shampoo.  3.  After you shampoo, rinse your hair and body thoroughly to remove the                      Shampoo.  4.  Use CHG as you would any other liquid soap.  You can apply chg directly       to the skin and wash gently with scrungie or a clean washcloth.  5.  Apply the CHG Soap to your body ONLY FROM THE NECK DOWN.        Do not use on open wounds or open sores.  Avoid contact with your eyes, ears, mouth and genitals (private parts).  Wash genitals (private parts) with your normal soap.  6.  Wash thoroughly, paying special attention to the area where your surgery        will be performed.  7.  Thoroughly rinse your body with warm water from the neck down.  8.  DO NOT shower/wash with your normal soap after using and rinsing off       the CHG Soap.  9.  Pat yourself dry with a clean towel.            10.  Wear clean pajamas.            11.  Place clean sheets on your bed the night of your first shower and do not        sleep with pets.  Day of Surgery  Do not apply any lotions the morning of surgery.  Please wear clean clothes to the hospital.     Please read over the following fact sheets that you were given: Pain Booklet, Coughing and Deep Breathing and Surgical Site Infection Prevention

## 2014-11-11 ENCOUNTER — Encounter (HOSPITAL_COMMUNITY): Payer: Self-pay

## 2014-11-11 ENCOUNTER — Encounter (HOSPITAL_COMMUNITY)
Admission: RE | Admit: 2014-11-11 | Discharge: 2014-11-11 | Disposition: A | Discharge status: Home / Self Care | Payer: Medicare Other | Source: Ambulatory Visit | Attending: General Surgery | Admitting: General Surgery

## 2014-11-11 DIAGNOSIS — R002 Palpitations: Secondary | ICD-10-CM | POA: Insufficient documentation

## 2014-11-11 DIAGNOSIS — R011 Cardiac murmur, unspecified: Secondary | ICD-10-CM | POA: Diagnosis not present

## 2014-11-11 DIAGNOSIS — I10 Essential (primary) hypertension: Secondary | ICD-10-CM | POA: Insufficient documentation

## 2014-11-11 DIAGNOSIS — K8020 Calculus of gallbladder without cholecystitis without obstruction: Secondary | ICD-10-CM | POA: Insufficient documentation

## 2014-11-11 DIAGNOSIS — Z01812 Encounter for preprocedural laboratory examination: Secondary | ICD-10-CM | POA: Insufficient documentation

## 2014-11-11 DIAGNOSIS — J449 Chronic obstructive pulmonary disease, unspecified: Secondary | ICD-10-CM | POA: Diagnosis not present

## 2014-11-11 DIAGNOSIS — Z87891 Personal history of nicotine dependence: Secondary | ICD-10-CM | POA: Diagnosis not present

## 2014-11-11 HISTORY — DX: Cardiac murmur, unspecified: R01.1

## 2014-11-11 LAB — COMPREHENSIVE METABOLIC PANEL
ALT: 18 U/L (ref 0–53)
ANION GAP: 12 (ref 5–15)
AST: 26 U/L (ref 0–37)
Albumin: 3.6 g/dL (ref 3.5–5.2)
Alkaline Phosphatase: 71 U/L (ref 39–117)
BUN: 29 mg/dL — AB (ref 6–23)
CO2: 24 mEq/L (ref 19–32)
CREATININE: 1.03 mg/dL (ref 0.50–1.35)
Calcium: 9.2 mg/dL (ref 8.4–10.5)
Chloride: 102 mEq/L (ref 96–112)
GFR calc Af Amer: 75 mL/min — ABNORMAL LOW (ref >90)
GFR calc non Af Amer: 65 mL/min — ABNORMAL LOW (ref >90)
Glucose, Bld: 114 mg/dL — ABNORMAL HIGH (ref 70–99)
Potassium: 5 mEq/L (ref 3.7–5.3)
Sodium: 138 mEq/L (ref 137–147)
TOTAL PROTEIN: 6.3 g/dL (ref 6.0–8.3)
Total Bilirubin: 0.7 mg/dL (ref 0.3–1.2)

## 2014-11-11 LAB — CBC WITH DIFFERENTIAL/PLATELET
BASOS ABS: 0 10*3/uL (ref 0.0–0.1)
Basophils Relative: 0 % (ref 0–1)
EOS ABS: 0.1 10*3/uL (ref 0.0–0.7)
Eosinophils Relative: 1 % (ref 0–5)
HCT: 46.3 % (ref 39.0–52.0)
HEMOGLOBIN: 15.6 g/dL (ref 13.0–17.0)
Lymphocytes Relative: 43 % (ref 12–46)
Lymphs Abs: 3.2 10*3/uL (ref 0.7–4.0)
MCH: 28.3 pg (ref 26.0–34.0)
MCHC: 33.7 g/dL (ref 30.0–36.0)
MCV: 83.9 fL (ref 78.0–100.0)
MONOS PCT: 5 % (ref 3–12)
Monocytes Absolute: 0.3 10*3/uL (ref 0.1–1.0)
Neutro Abs: 3.8 10*3/uL (ref 1.7–7.7)
Neutrophils Relative %: 51 % (ref 43–77)
PLATELETS: 157 10*3/uL (ref 150–400)
RBC: 5.52 MIL/uL (ref 4.22–5.81)
RDW: 13.5 % (ref 11.5–15.5)
WBC: 7.4 10*3/uL (ref 4.0–10.5)

## 2014-11-11 LAB — PROTIME-INR
INR: 1.14 (ref 0.00–1.49)
Prothrombin Time: 14.8 seconds (ref 11.6–15.2)

## 2014-11-11 NOTE — Progress Notes (Addendum)
Cardiac clearance was given to Dr Zella Richer by Dr Johnsie Cancel patient's cardiologist . Patient stated he stopped taking Plavix .

## 2014-11-12 NOTE — Progress Notes (Signed)
Anesthesia Chart Review:  Pt is 80 year old male scheduled for laparoscopic cholecystectomy with intraoperative cholangiogram on 11/19/2014 with Dr. Zella Richer.   PMH: HTN, palpitations (event monitor in 2013 showed PACs and PVCs), heart murmur (mild MR), cerebrovascular disease, COPD. Former smoker.   Medications include: amlodipine, terazosin, protonix. Was taking plavix but stopped months ago.   Preoperative labs reviewed.    EKG 12/16/2014: sinus rhythm with occasional, consecutive PVCs and PACs with aberrant conduction. ST and T wave abnormality, consider lateral ischemia. Per Dr. Kyla Balzarine note in [place] 12/16/2013, this is unchanged since 2013.   2D echo 01/05/2014:  - Left ventricle: The cavity size was normal. There was mild focal basal and moderate concentric hypertrophy of the septum without evidence of LVOT obstruction. Systolicfunction was normal. The estimated ejection fraction was in the range of 60% to 65%. Wall motion was normal; there were no regional wall motion abnormalities. Doppler parameters are consistent with abnormal left ventricular relaxation (grade 1 diastolic dysfunction). Doppler parameters are consistent with high ventricular filling pressure. - Mitral valve: Mild regurgitation. - Left atrium: The atrium was moderately dilated. - Right ventricle: The cavity size was moderately dilated. Wall thickness was normal. Systolic function was normal. - Right atrium: The atrium was mildly dilated. - Atrial septum: No defect or patent foramen ovale wasidentified. - Tricuspid valve: No regurgitation.  Pt has cardiac clearance to proceed with surgery per telephone encounter from [facility] 10/15/2014 and Dr. Bertrum Sol note 10/15/2014.   If no changes, I anticipate pt can proceed with surgery as scheduled.   Willeen Cass, FNP-BC [facility]/Anesthesiology Phone: [phone] 11/12/2014 1:25 PM

## 2014-11-18 MED ORDER — CEFAZOLIN SODIUM-DEXTROSE 2-3 GM-% IV SOLR
2.0000 g | INTRAVENOUS | Status: AC
  Administered 2014-11-19: 2 g via INTRAVENOUS
  Filled 2014-11-18: qty 50

## 2014-11-19 ENCOUNTER — Encounter (HOSPITAL_COMMUNITY)
Admission: RE | Disposition: A | Discharge status: Home / Self Care | Payer: Self-pay | Source: Ambulatory Visit | Attending: General Surgery

## 2014-11-19 ENCOUNTER — Ambulatory Visit (HOSPITAL_COMMUNITY): Payer: Medicare Other

## 2014-11-19 ENCOUNTER — Encounter (HOSPITAL_COMMUNITY): Payer: Self-pay | Admitting: *Deleted

## 2014-11-19 ENCOUNTER — Ambulatory Visit (HOSPITAL_COMMUNITY)
Admission: RE | Admit: 2014-11-19 | Discharge: 2014-11-20 | Disposition: A | Discharge status: Home / Self Care | Payer: Medicare Other | Source: Ambulatory Visit | Attending: General Surgery | Admitting: General Surgery

## 2014-11-19 ENCOUNTER — Ambulatory Visit (HOSPITAL_COMMUNITY): Payer: Medicare Other | Admitting: Anesthesiology

## 2014-11-19 ENCOUNTER — Ambulatory Visit (HOSPITAL_COMMUNITY): Payer: Medicare Other | Admitting: Emergency Medicine

## 2014-11-19 DIAGNOSIS — K8010 Calculus of gallbladder with chronic cholecystitis without obstruction: Secondary | ICD-10-CM | POA: Diagnosis not present

## 2014-11-19 DIAGNOSIS — K219 Gastro-esophageal reflux disease without esophagitis: Secondary | ICD-10-CM | POA: Diagnosis not present

## 2014-11-19 DIAGNOSIS — K8020 Calculus of gallbladder without cholecystitis without obstruction: Secondary | ICD-10-CM | POA: Diagnosis not present

## 2014-11-19 DIAGNOSIS — J449 Chronic obstructive pulmonary disease, unspecified: Secondary | ICD-10-CM | POA: Diagnosis not present

## 2014-11-19 HISTORY — PX: CHOLECYSTECTOMY: SHX55

## 2014-11-19 HISTORY — PX: LAPAROSCOPIC CHOLECYSTECTOMY: SUR755

## 2014-11-19 SURGERY — LAPAROSCOPIC CHOLECYSTECTOMY WITH INTRAOPERATIVE CHOLANGIOGRAM
Anesthesia: General | Site: Abdomen

## 2014-11-19 MED ORDER — EPHEDRINE SULFATE 50 MG/ML IJ SOLN
INTRAMUSCULAR | Status: DC | PRN
  Administered 2014-11-19: 10 mg via INTRAVENOUS

## 2014-11-19 MED ORDER — TERAZOSIN HCL 5 MG PO CAPS
10.0000 mg | ORAL_CAPSULE | Freq: Every day | ORAL | Status: DC
  Administered 2014-11-19: 10 mg via ORAL
  Filled 2014-11-19 (×2): qty 2

## 2014-11-19 MED ORDER — FENTANYL CITRATE 0.05 MG/ML IJ SOLN
INTRAMUSCULAR | Status: DC | PRN
  Administered 2014-11-19 (×4): 50 ug via INTRAVENOUS

## 2014-11-19 MED ORDER — SODIUM CHLORIDE 0.9 % IV SOLN
INTRAVENOUS | Status: DC | PRN
  Administered 2014-11-19: 12 mL

## 2014-11-19 MED ORDER — DEXAMETHASONE SODIUM PHOSPHATE 10 MG/ML IJ SOLN
INTRAMUSCULAR | Status: AC
  Filled 2014-11-19: qty 1

## 2014-11-19 MED ORDER — LIDOCAINE HCL (CARDIAC) 20 MG/ML IV SOLN
INTRAVENOUS | Status: DC | PRN
  Administered 2014-11-19: 35 mg via INTRAVENOUS
  Administered 2014-11-19: 15 mg via INTRAVENOUS

## 2014-11-19 MED ORDER — MORPHINE SULFATE 2 MG/ML IJ SOLN: 2.0000 mg | INTRAMUSCULAR | Status: DC | PRN

## 2014-11-19 MED ORDER — ONDANSETRON HCL 4 MG PO TABS: 4.0000 mg | ORAL_TABLET | Freq: Four times a day (QID) | ORAL | Status: DC | PRN

## 2014-11-19 MED ORDER — PANTOPRAZOLE SODIUM 40 MG PO TBEC
40.0000 mg | DELAYED_RELEASE_TABLET | Freq: Every day | ORAL | Status: DC
  Administered 2014-11-20: 40 mg via ORAL
  Filled 2014-11-19: qty 1

## 2014-11-19 MED ORDER — FENTANYL CITRATE 0.05 MG/ML IJ SOLN
INTRAMUSCULAR | Status: AC
  Filled 2014-11-19: qty 5

## 2014-11-19 MED ORDER — NEOSTIGMINE METHYLSULFATE 10 MG/10ML IV SOLN
INTRAVENOUS | Status: DC | PRN
  Administered 2014-11-19: 4 mg via INTRAVENOUS

## 2014-11-19 MED ORDER — GLYCOPYRROLATE 0.2 MG/ML IJ SOLN
INTRAMUSCULAR | Status: AC
  Filled 2014-11-19: qty 3

## 2014-11-19 MED ORDER — HYDROCODONE-ACETAMINOPHEN 5-325 MG PO TABS
1.0000 | ORAL_TABLET | ORAL | Status: DC | PRN
  Administered 2014-11-20: 2 via ORAL
  Administered 2014-11-20: 1 via ORAL
  Filled 2014-11-19: qty 2
  Filled 2014-11-19: qty 1

## 2014-11-19 MED ORDER — ROCURONIUM BROMIDE 100 MG/10ML IV SOLN
INTRAVENOUS | Status: DC | PRN
  Administered 2014-11-19: 40 mg via INTRAVENOUS
  Administered 2014-11-19: 10 mg via INTRAVENOUS

## 2014-11-19 MED ORDER — ONDANSETRON HCL 4 MG/2ML IJ SOLN: 4.0000 mg | INTRAMUSCULAR | Status: DC | PRN

## 2014-11-19 MED ORDER — 0.9 % SODIUM CHLORIDE (POUR BTL) OPTIME
TOPICAL | Status: DC | PRN
  Administered 2014-11-19: 1000 mL

## 2014-11-19 MED ORDER — BUPIVACAINE-EPINEPHRINE 0.25% -1:200000 IJ SOLN
INTRAMUSCULAR | Status: DC | PRN
  Administered 2014-11-19: 30 mL

## 2014-11-19 MED ORDER — MIDAZOLAM HCL 2 MG/2ML IJ SOLN
INTRAMUSCULAR | Status: AC
  Filled 2014-11-19: qty 2

## 2014-11-19 MED ORDER — LACTATED RINGERS IV SOLN
INTRAVENOUS | Status: DC
  Administered 2014-11-19: 09:00:00 via INTRAVENOUS

## 2014-11-19 MED ORDER — ARTIFICIAL TEARS OP OINT
TOPICAL_OINTMENT | OPHTHALMIC | Status: AC
  Filled 2014-11-19: qty 3.5

## 2014-11-19 MED ORDER — LIDOCAINE HCL (CARDIAC) 20 MG/ML IV SOLN
INTRAVENOUS | Status: AC
  Filled 2014-11-19: qty 5

## 2014-11-19 MED ORDER — ARTIFICIAL TEARS OP OINT
TOPICAL_OINTMENT | OPHTHALMIC | Status: DC | PRN
  Administered 2014-11-19: 1 via OPHTHALMIC

## 2014-11-19 MED ORDER — FENTANYL CITRATE 0.05 MG/ML IJ SOLN: 25.0000 ug | INTRAMUSCULAR | Status: DC | PRN

## 2014-11-19 MED ORDER — KCL-LACTATED RINGERS-D5W 20 MEQ/L IV SOLN
INTRAVENOUS | Status: DC
  Administered 2014-11-19 – 2014-11-20 (×2): via INTRAVENOUS
  Filled 2014-11-19 (×3): qty 1000

## 2014-11-19 MED ORDER — NEOSTIGMINE METHYLSULFATE 10 MG/10ML IV SOLN
INTRAVENOUS | Status: AC
  Filled 2014-11-19: qty 1

## 2014-11-19 MED ORDER — GLYCOPYRROLATE 0.2 MG/ML IJ SOLN
INTRAMUSCULAR | Status: DC | PRN
  Administered 2014-11-19: 0.2 mg via INTRAVENOUS
  Administered 2014-11-19: 0.6 mg via INTRAVENOUS

## 2014-11-19 MED ORDER — HEMOSTATIC AGENTS (NO CHARGE) OPTIME
TOPICAL | Status: DC | PRN
  Administered 2014-11-19: 1 via TOPICAL

## 2014-11-19 MED ORDER — OXYMETAZOLINE HCL 0.05 % NA SOLN
1.0000 | Freq: Two times a day (BID) | NASAL | Status: DC
Start: 2014-11-19 — End: 2014-11-20
  Administered 2014-11-19 (×2): 1 via NASAL
  Filled 2014-11-19: qty 15

## 2014-11-19 MED ORDER — DROPERIDOL 2.5 MG/ML IJ SOLN
0.6250 mg | INTRAMUSCULAR | Status: DC | PRN
  Filled 2014-11-19: qty 0.25

## 2014-11-19 MED ORDER — ONDANSETRON HCL 4 MG/2ML IJ SOLN
INTRAMUSCULAR | Status: DC | PRN
  Administered 2014-11-19: 4 mg via INTRAVENOUS

## 2014-11-19 MED ORDER — SODIUM CHLORIDE 0.9 % IR SOLN
Status: DC | PRN
  Administered 2014-11-19: 1

## 2014-11-19 MED ORDER — HYDROCODONE-ACETAMINOPHEN 5-325 MG PO TABS: 1.0000 | ORAL_TABLET | ORAL | Status: DC | PRN

## 2014-11-19 MED ORDER — PROPOFOL 10 MG/ML IV BOLUS
INTRAVENOUS | Status: AC
  Filled 2014-11-19: qty 20

## 2014-11-19 MED ORDER — PROPOFOL 10 MG/ML IV BOLUS
INTRAVENOUS | Status: DC | PRN
  Administered 2014-11-19: 120 mg via INTRAVENOUS

## 2014-11-19 MED ORDER — ROCURONIUM BROMIDE 50 MG/5ML IV SOLN
INTRAVENOUS | Status: AC
  Filled 2014-11-19: qty 1

## 2014-11-19 MED ORDER — LACTATED RINGERS IV SOLN
INTRAVENOUS | Status: DC | PRN
  Administered 2014-11-19 (×2): via INTRAVENOUS

## 2014-11-19 MED ORDER — AMLODIPINE BESYLATE 5 MG PO TABS
5.0000 mg | ORAL_TABLET | Freq: Every day | ORAL | Status: DC
  Administered 2014-11-20: 5 mg via ORAL
  Filled 2014-11-19: qty 1

## 2014-11-19 MED ORDER — ONDANSETRON HCL 4 MG/2ML IJ SOLN
INTRAMUSCULAR | Status: AC
  Filled 2014-11-19: qty 2

## 2014-11-19 SURGICAL SUPPLY — 51 items
APL SKNCLS STERI-STRIP NONHPOA (GAUZE/BANDAGES/DRESSINGS) ×1
APPLIER CLIP 5 13 M/L LIGAMAX5 (MISCELLANEOUS) ×3
APR CLP MED LRG 5 ANG JAW (MISCELLANEOUS) ×1
BAG SPEC RTRVL LRG 6X4 10 (ENDOMECHANICALS) ×1
BENZOIN TINCTURE PRP APPL 2/3 (GAUZE/BANDAGES/DRESSINGS) ×3 IMPLANT
CANISTER SUCTION 2500CC (MISCELLANEOUS) ×3 IMPLANT
CHLORAPREP W/TINT 26ML (MISCELLANEOUS) ×3 IMPLANT
CLIP APPLIE 5 13 M/L LIGAMAX5 (MISCELLANEOUS) ×1 IMPLANT
CLOSURE WOUND 1/2 X4 (GAUZE/BANDAGES/DRESSINGS) ×1
COVER MAYO STAND STRL (DRAPES) ×3 IMPLANT
COVER SURGICAL LIGHT HANDLE (MISCELLANEOUS) ×3 IMPLANT
DECANTER SPIKE VIAL GLASS SM (MISCELLANEOUS) ×2 IMPLANT
DRAPE C-ARM 42X72 X-RAY (DRAPES) ×3 IMPLANT
DRAPE LAPAROSCOPIC ABDOMINAL (DRAPES) ×3 IMPLANT
DRAPE UTILITY 15X26 W/TAPE STR (DRAPE) ×2 IMPLANT
DRSG TEGADERM 2-3/8X2-3/4 SM (GAUZE/BANDAGES/DRESSINGS) ×6 IMPLANT
ELECT REM PT RETURN 9FT ADLT (ELECTROSURGICAL) ×3
ELECTRODE REM PT RTRN 9FT ADLT (ELECTROSURGICAL) ×1 IMPLANT
GAUZE SPONGE 2X2 8PLY STRL LF (GAUZE/BANDAGES/DRESSINGS) ×1 IMPLANT
GLOVE BIOGEL PI IND STRL 7.0 (GLOVE) IMPLANT
GLOVE BIOGEL PI IND STRL 8 (GLOVE) ×1 IMPLANT
GLOVE BIOGEL PI INDICATOR 7.0 (GLOVE) ×2
GLOVE BIOGEL PI INDICATOR 8 (GLOVE) ×4
GLOVE ECLIPSE 8.0 STRL XLNG CF (GLOVE) ×3 IMPLANT
GLOVE EUDERMIC 7 POWDERFREE (GLOVE) ×2 IMPLANT
GLOVE SURG SS PI 7.0 STRL IVOR (GLOVE) ×3 IMPLANT
GOWN STRL REUS W/ TWL LRG LVL3 (GOWN DISPOSABLE) ×4 IMPLANT
GOWN STRL REUS W/ TWL XL LVL3 (GOWN DISPOSABLE) IMPLANT
GOWN STRL REUS W/TWL LRG LVL3 (GOWN DISPOSABLE) ×9
GOWN STRL REUS W/TWL XL LVL3 (GOWN DISPOSABLE) ×3
HEMOSTAT SNOW SURGICEL 2X4 (HEMOSTASIS) ×2 IMPLANT
KIT BASIN OR (CUSTOM PROCEDURE TRAY) ×3 IMPLANT
KIT ROOM TURNOVER OR (KITS) ×3 IMPLANT
NS IRRIG 1000ML POUR BTL (IV SOLUTION) ×3 IMPLANT
PAD ARMBOARD 7.5X6 YLW CONV (MISCELLANEOUS) ×3 IMPLANT
POUCH SPECIMEN RETRIEVAL 10MM (ENDOMECHANICALS) ×3 IMPLANT
SCISSORS LAP 5X35 DISP (ENDOMECHANICALS) ×3 IMPLANT
SET CHOLANGIOGRAPH 5 50 .035 (SET/KITS/TRAYS/PACK) ×3 IMPLANT
SET IRRIG TUBING LAPAROSCOPIC (IRRIGATION / IRRIGATOR) ×3 IMPLANT
SLEEVE ENDOPATH XCEL 5M (ENDOMECHANICALS) ×6 IMPLANT
SPECIMEN JAR SMALL (MISCELLANEOUS) ×3 IMPLANT
SPONGE GAUZE 2X2 STER 10/PKG (GAUZE/BANDAGES/DRESSINGS) ×2
STRIP CLOSURE SKIN 1/2X4 (GAUZE/BANDAGES/DRESSINGS) ×2 IMPLANT
SUT MON AB 4-0 PC3 18 (SUTURE) ×3 IMPLANT
TOWEL OR 17X24 6PK STRL BLUE (TOWEL DISPOSABLE) ×3 IMPLANT
TOWEL OR 17X26 10 PK STRL BLUE (TOWEL DISPOSABLE) ×3 IMPLANT
TRAY LAPAROSCOPIC (CUSTOM PROCEDURE TRAY) ×3 IMPLANT
TROCAR XCEL BLUNT TIP 100MML (ENDOMECHANICALS) ×3 IMPLANT
TROCAR XCEL NON-BLD 11X100MML (ENDOMECHANICALS) IMPLANT
TROCAR XCEL NON-BLD 5MMX100MML (ENDOMECHANICALS) ×3 IMPLANT
TUBING INSUFFLATION (TUBING) ×3 IMPLANT

## 2014-11-19 NOTE — Op Note (Signed)
Preoperative diagnosis:  Symptomatic cholelithiasis  Postoperative diagnosis:  same  Procedure: Laparoscopic cholecystectomy with cholangiogram.  Surgeon: [name], M.D.  Asst.:  Fanny Skates M.D.  Anesthesia: General  Indication:   This is an 80 year old male who has symptomatic cholelithiasis. He now presents for elective laparoscopic cholecystectomy.  Technique: He was brought to the operating room, placed supine on the operating table, and a general anesthetic was administered. The hair on the abdominal wall was clipped as was necessary. The abdominal wall was then sterilely prepped and draped. Local anesthetic (Marcaine) was infiltrated in the subumbilical region. A small subumbilical incision was made through the skin, subcutaneous tissue, fascia, and peritoneum entering the peritoneal cavity under direct vision. A pursestring suture of 0 Vicryl was placed around the edges of the fascia. A Hassan trocar was introduced into the peritoneal cavity and a pneumoperitoneum was created by insufflation of carbon dioxide gas. The laparoscope was introduced into the trocar and no underlying bleeding or organ injury was noted. He was then placed in the reverse Trendelenburg position with the right side tilted slightly up.  Three 5 mm trocars were then placed into the abdominal cavity under laparoscopic vision. One in the epigastric area, and 2 in the right upper quadrant area. The gallbladder was visualized and the fundus was grasped and retracted toward the right shoulder.  The infundibulum was mobilized with dissection close to the gallbladder and retracted laterally. The cystic duct was identified and a window was created around it. The cystic artery was also identified and a window was created around it.  The artery was clipped and divided.   The critical view was achieved. A clip was placed at the neck of the gallbladder. A small incision was made in the cystic duct. A cholangiocatheter was  introduced through the anterior abdominal wall and placed in the cystic duct. A intraoperative cholangiogram was then performed.  Under real-time fluoroscopy, dilute contrast was injected into the cystic duct.  The common hepatic duct, the right and left hepatic ducts, and the common duct were all visualized. Contrast drained into the duodenum without obvious evidence of any obstructing ductal lesion. The final report is pending the Radiologist's interpretation.  The cholangiocatheter was removed, the cystic duct was clipped 3 times on the biliary side, and then the cystic duct was divided sharply. No bile leak was noted from the cystic duct stump.  Following this the gallbladder was dissected free from the liver using electrocautery. The gallbladder was then placed in a retrieval bag and removed from the abdominal cavity through the subumbilical incision.  The gallbladder fossa was inspected, irrigated, and bleeding was controlled with electrocautery. Inspection showed that hemostasis was adequate and there was no evidence of bile leak.  The irrigation fluid was evacuated as much as possible.  A piece of Surgicel was placed in the gallbladder fossa.  The subumbilical trocar was removed and the fascial defect was closed by tightening and tying down the pursestring suture under laparoscopic vision.  The remaining trocars were removed and the pneumoperitoneum was released. The skin incisions were closed with 4-0 Monocryl subcuticular stitches. Steri-Strips and sterile dressings were applied.  The procedure was well-tolerated without any apparent complications. He was taken to the recovery room in satisfactory condition.

## 2014-11-19 NOTE — Progress Notes (Signed)
Called Dr.Jackson for sign out  

## 2014-11-19 NOTE — Discharge Instructions (Signed)
[facility] ______[facility] LAPAROSCOPIC SURGERY: POST OP INSTRUCTIONS Always review your discharge instruction sheet given to you by the facility where your surgery was performed. IF YOU HAVE DISABILITY OR FAMILY LEAVE FORMS, YOU MUST BRING THEM TO THE OFFICE FOR PROCESSING.   DO NOT GIVE THEM TO YOUR DOCTOR.  1. A prescription for pain medication may be given to you upon discharge.  Take your pain medication as prescribed, if needed.  If narcotic pain medicine is not needed, then you may take acetaminophen (Tylenol) or ibuprofen (Advil) as needed. 2. Take your usually prescribed medications unless otherwise directed. 3. If you need a refill on your pain medication, please contact your pharmacy.  They will contact our office to request authorization. Prescriptions will not be filled after 5pm or on week-ends. 4. You should follow a lowfat after arrival home.  Be sure to include lots of fluids daily. 5. Most patients will experience some swelling and bruising in the area of the incisions.  Ice packs will help.  Swelling and bruising can take several days to resolve.  6. It is common to experience some constipation if taking pain medication after surgery.  Increasing fluid intake and taking a stool softener (such as Colace) will usually help or prevent this problem from occurring.  A mild laxative (Milk of Magnesia or Miralax) should be taken according to package instructions if there are no bowel movements after 48 hours. 7. Unless discharge instructions indicate otherwise, you may remove your bandages 72 hours after surgery.  You may shower the day after your surgery.  You may have steri-strips (small skin tapes) in place directly over the incision.  These strips should be left on the skin.  If your surgeon used skin glue on the incision, you may shower in 24 hours.  The glue will flake off over the next 2-3 weeks.  Any sutures or staples will be removed at the office during your follow-up  visit. 8. ACTIVITIES:  You may resume regular (light) daily activities beginning the next day--such as daily self-care, walking, climbing stairs--gradually increasing activities as tolerated.  You may have sexual intercourse when it is comfortable.  Refrain from any heavy lifting or straining for 2 weeks. a. You may drive when you are no longer taking prescription pain medication, you can comfortably wear a seatbelt, and you can safely maneuver your car and apply brakes. b. RETURN TO WORK:  __________________________________________________________ 9. You should see your doctor in the office for a follow-up appointment approximately 2-3 weeks after your surgery.  Make sure that you call for this appointment within a day or two after you arrive home to insure a convenient appointment time. 10. OTHER INSTRUCTIONS: __________________________________________________________________________________________________________________________ __________________________________________________________________________________________________________________________ WHEN TO CALL YOUR DOCTOR: 1. Fever over 101.0 2. Inability to urinate 3. Continued bleeding from incision. 4. Increased pain, redness, or drainage from the incision. 5. Increasing abdominal pain  The clinic staff is available to answer your questions during regular business hours.  Please dont hesitate to call and ask to speak to one of the nurses for clinical concerns.  If you have a medical emergency, go to the nearest emergency room or call 911.  A surgeon from [facility] is always on call at the hospital. [address] [place], [place], [place]  [address] ? [address], [place], [place]   [address] [phone] ? [phone] ? FAX [phone] Web site: [email or web address]

## 2014-11-19 NOTE — Anesthesia Preprocedure Evaluation (Addendum)
Anesthesia Evaluation  Patient identified by MRN, date of birth, ID band Patient awake    Reviewed: Allergy & Precautions, H&P , NPO status , Patient's Chart, lab work & pertinent test results  History of Anesthesia Complications Negative for: history of anesthetic complications  Airway Mallampati: I  TM Distance: >3 FB Neck ROM: Full    Dental  (+) Teeth Intact, Dental Advisory Given   Pulmonary former smoker (quit 2000),  breath sounds clear to auscultation        Cardiovascular hypertension, Pt. on medications - anginaRhythm:Regular Rate:Normal  4/15 ECHO: EF 27-06%, grade 1 diastolic dysfunction, valves OK   Neuro/Psych CVA (h/o ocular stroke ), No Residual Symptoms    GI/Hepatic Neg liver ROS, GERD-  Medicated and Controlled,  Endo/Other  negative endocrine ROS  Renal/GU negative Renal ROS     Musculoskeletal   Abdominal   Peds  Hematology   Anesthesia Other Findings   Reproductive/Obstetrics                           Anesthesia Physical Anesthesia Plan  ASA: III  Anesthesia Plan: General   Post-op Pain Management:    Induction: Intravenous  Airway Management Planned: Oral ETT  Additional Equipment:   Intra-op Plan:   Post-operative Plan: Extubation in OR  Informed Consent: I have reviewed the patients History and Physical, chart, labs and discussed the procedure including the risks, benefits and alternatives for the proposed anesthesia with the patient or authorized representative who has indicated his/her understanding and acceptance.   Dental advisory given  Plan Discussed with: CRNA and Surgeon  Anesthesia Plan Comments: (Plan routine monitors, GETA)        Anesthesia Quick Evaluation

## 2014-11-19 NOTE — Anesthesia Procedure Notes (Addendum)
Procedure Name: Intubation Date/Time: 11/19/2014 10:34 AM Performed by: Scheryl Darter Pre-anesthesia Checklist: Patient identified, Emergency Drugs available, Suction available, Patient being monitored and Timeout performed Patient Re-evaluated:Patient Re-evaluated prior to inductionOxygen Delivery Method: Circle system utilized Preoxygenation: Pre-oxygenation with 100% oxygen Ventilation: Mask ventilation without difficulty Grade View: Grade I Tube type: Oral Tube size: 7.5 mm Number of attempts: 1 Airway Equipment and Method: Stylet Placement Confirmation: ETT inserted through vocal cords under direct vision,  positive ETCO2 and breath sounds checked- equal and bilateral Secured at: 23 cm Tube secured with: Tape Dental Injury: Teeth and Oropharynx as per pre-operative assessment  Comments: Intubated by EMT student Conner with direction by Dr Donnelly Angelica

## 2014-11-19 NOTE — H&P (Signed)
Jared Jared, May 11, 1929  HPI:  He was referred by Dr. Shelia Media because of epigastric pain and gallstones. About 6 weeks ago, he had an episode of epigastric discomfort that lasted approximately 6 hours.He had some episodes after that. They seem to be related to drinking coffee with cream or drinking a glass of wine. An abdominal ultrasound demonstrated multiple gallstones with slight gallbladder wall thickening and edema. The common bile duct diameter was 8 mm which is normal for age .Some cysts were noted on his kidney as well. He has stopped drinking wine and is using less cream and has coffee. His symptoms have improved. He initially lost 6 pounds, but has gained all of this back.   Allergies  Procardia *CALCIUM CHANNEL BLOCKERS*  Medication History AmLODIPine Besylate (5MG  Tablet, Oral daily) Active. Pantoprazole Sodium (40MG  Tablet DR, Oral daily) Active. Ranitidine HCl (150MG  Tablet, Oral as needed) Active. Triamcinolone Acetonide (0.1% Cream, External as needed) Active. Clotrimazole (1% Cream, External as needed) Active.  Review of Systems  General Present- Weight Loss. Not Present- Appetite Loss, Chills, Fatigue, Fever, Night Sweats and Weight Gain. Skin Not Present- Change in Wart/Mole, Dryness, Hives, Jaundice, New Lesions, Non-Healing Wounds, Rash and Ulcer. HEENT Present- Hearing Loss and Ringing in the Ears. Not Present- Earache, Hoarseness, Nose Bleed, Oral Ulcers, Seasonal Allergies, Sinus Pain, Sore Throat, Visual Disturbances, Wears glasses/contact lenses and Yellow Eyes. Breast Not Present- Breast Mass, Breast Pain, Nipple Discharge and Skin Changes. Cardiovascular Present- Palpitations. Not Present- Chest Pain, Difficulty Breathing Lying Down, Leg Cramps, Rapid Heart Rate, Shortness of Breath and Swelling of Extremities. Gastrointestinal Present- Excessive gas. Not Present- Abdominal Pain, Bloating, Bloody Stool, Change in Bowel Habits, Chronic diarrhea,  Constipation, Difficulty Swallowing, Gets full quickly at meals, Hemorrhoids, Indigestion, Nausea, Rectal Pain and Vomiting. Male Genitourinary Present- Change in Urinary Stream, Impotence and Nocturia. Not Present- Blood in Urine, Frequency, Painful Urination, Urgency and Urine Leakage. Musculoskeletal Not Present- Back Pain, Joint Pain, Joint Stiffness, Muscle Pain, Muscle Weakness and Swelling of Extremities. Neurological Not Present- Decreased Memory, Fainting, Headaches, Numbness, Seizures, Tingling, Tremor, Trouble walking and Weakness. Psychiatric Not Present- Anxiety, Bipolar, Change in Sleep Pattern, Depression, Fearful and Frequent crying. Endocrine Not Present- Cold Intolerance, Excessive Hunger, Hair Changes, Heat Intolerance, Hot flashes and New Diabetes. Hematology Not Present- Easy Bruising, Excessive bleeding, Gland problems, HIV and Persistent Infections.    Physical Exam  The physical exam findings are as follows: Note:General: Elderly in NAD. Pleasant and cooperative.  HEENT: [place]/AT, no facial masses  EYES: EOMI, no icterus  CV: RRR, no murmur, no JVD.  CHEST: Breath sounds equal and clear. Respirations nonlabored.  ABDOMEN: Soft, nontender, nondistended, no masses, no organomegaly, active bowel sounds, no scars, no hernias.  NEUROLOGIC: Alert and oriented, answers questions appropriately, normal gait and station.  PSYCHIATRIC: Normal mood, affect , and behavior.    Assessment & Plan  CHOLELITHIASIS WITHOUT CHOLECYSTITIS (574.20  K80.20) Impression: He has symptomatic cholelithiasis. We discussed laparoscopic cholecystectomy for this versus strictdiet control. He is somewhat interested in surgery.We'll need to talk to Dr. Johnsie Cancel about his cardiac risk prior to scheduling surgery. Would then speak with Mr. Fyfe after this. I have explained the procedure, risks, and aftercare of cholecystectomy. Risks include but are not limited to bleeding, infection, wound  problems, anesthesia, diarrhea, bile leak, injury to common bile duct/liver/intestine. He seems to understand and agrees to proceed.  Dr. Johnsie Cancel sent me a message stating that he has a normal ejection fraction and should be okay to proceed with  the surgery.   [name], M.D.

## 2014-11-19 NOTE — Plan of Care (Signed)
Problem: Phase I Progression Outcomes Goal: Incision/dressings dry and intact Outcome: Completed/Met Date Met:  11/19/14

## 2014-11-19 NOTE — Anesthesia Postprocedure Evaluation (Signed)
  Anesthesia Post-op Note  Patient: Jared Neal  Procedure(s) Performed: Procedure(s): LAPAROSCOPIC CHOLECYSTECTOMY WITH INTRAOPERATIVE CHOLANGIOGRAM (N/A)  Patient Location: PACU  Anesthesia Type:General  Level of Consciousness: awake, alert , oriented and patient cooperative  Airway and Oxygen Therapy: Patient Spontanous Breathing  Post-op Pain: none  Post-op Assessment: Post-op Vital signs reviewed, Patient's Cardiovascular Status Stable, Respiratory Function Stable, Patent Airway, No signs of Nausea or vomiting and Pain level controlled  Post-op Vital Signs: Reviewed and stable  Last Vitals:  Filed Vitals:   11/19/14 1237  BP:   Pulse:   Temp: 36.2 C  Resp:     Complications: No apparent anesthesia complications

## 2014-11-19 NOTE — Plan of Care (Signed)
Problem: Phase I Progression Outcomes Goal: Voiding-avoid urinary catheter unless indicated Outcome: Completed/Met Date Met:  11/19/14

## 2014-11-19 NOTE — Transfer of Care (Signed)
Immediate Anesthesia Transfer of Care Note  Patient: Jared Neal  Procedure(s) Performed: Procedure(s): LAPAROSCOPIC CHOLECYSTECTOMY WITH INTRAOPERATIVE CHOLANGIOGRAM (N/A)  Patient Location: PACU  Anesthesia Type:General  Level of Consciousness: awake, alert , oriented and sedated  Airway & Oxygen Therapy: Patient Spontanous Breathing and Patient connected to nasal cannula oxygen  Post-op Assessment: Report given to PACU RN, Post -op Vital signs reviewed and stable and Patient moving all extremities  Post vital signs: Reviewed and stable  Complications: No apparent anesthesia complications

## 2014-11-19 NOTE — Interval H&P Note (Signed)
History and Physical Interval Note:  11/19/2014 9:56 AM  Jared Neal  has presented today for surgery, with the diagnosis of symptomatic cholelithiasis  The various methods of treatment have been discussed with the patient and family. After consideration of risks, benefits and other options for treatment, the patient has consented to  Procedure(s): LAPAROSCOPIC CHOLECYSTECTOMY WITH INTRAOPERATIVE CHOLANGIOGRAM (N/A) as a surgical intervention .  The patient's history has been reviewed, patient examined, no change in status, stable for surgery.  I have reviewed the patient's chart and labs.  Questions were answered to the patient's satisfaction.     Madalyne Husk Lenna Sciara

## 2014-11-20 DIAGNOSIS — K8010 Calculus of gallbladder with chronic cholecystitis without obstruction: Secondary | ICD-10-CM | POA: Diagnosis not present

## 2014-11-20 MED ORDER — HYDROCODONE-ACETAMINOPHEN 5-325 MG PO TABS: 1.0000 | ORAL_TABLET | ORAL | Status: DC | PRN

## 2014-11-20 NOTE — Discharge Summary (Signed)
Patient ID: Jared Neal MRN: [id number] DOB/AGE: [birth year] 80 y.o.  Admit date: 11/19/2014 Discharge date: 11/20/2014  Procedures: lap chole with IOC  Consults: None  Reason for Admission: He was referred by Dr. Shelia Media because of epigastric pain and gallstones. About 6 weeks ago, he had an episode of epigastric discomfort that lasted approximately 6 hours.He had some episodes after that. They seem to be related to drinking coffee with cream or drinking a glass of wine. An abdominal ultrasound demonstrated multiple gallstones with slight gallbladder wall thickening and edema. The common bile duct diameter was 8 mm which is normal for age .Some cysts were noted on his kidney as well. He has stopped drinking wine and is using less cream and has coffee. His symptoms have improved. He initially lost 6 pounds, but has gained all of this back.  Admission Diagnoses:  1. Cholecystitis with cholelithiasis  Hospital Course:  The patient was admitted and underwent a lap chole.  He tolerated this well.  On POD 1, he was tolerating a regular diet and his pain was controlled with oral pain medications.  He was stable for dc home.  Discharge Diagnoses:  Active Problems:   Symptomatic cholelithiasis s/p lap chole  Discharge Medications:   Medication List    STOP taking these medications        clopidogrel 75 MG tablet  Commonly known as:  PLAVIX      TAKE these medications        amLODipine 5 MG tablet  Commonly known as:  NORVASC  Take 1 tablet by mouth Daily.     clotrimazole 1 % cream  Commonly known as:  LOTRIMIN  Apply 1 application topically 2 (two) times daily.     GOLD BOND INTENSIVE HEALING 1-6 % Crea  Generic drug:  Pramoxine-Dimethicone  Apply 1 application topically as needed.     HYDROcodone-acetaminophen 5-325 MG per tablet  Commonly known as:  NORCO/VICODIN  Take 1-2 tablets by mouth every 4 (four) hours as needed for moderate pain or severe pain.     HYDROcodone-acetaminophen 5-325 MG per tablet  Commonly known as:  NORCO/VICODIN  Take 1-2 tablets by mouth every 4 (four) hours as needed for moderate pain.     oxymetazoline 0.05 % nasal spray  Commonly known as:  AFRIN  Place 1 spray into both nostrils 2 (two) times daily.     pantoprazole 40 MG tablet  Commonly known as:  PROTONIX  Take 40 mg by mouth Daily.     ranitidine 150 MG tablet  Commonly known as:  ZANTAC  Take 150 mg by mouth 2 (two) times daily.     SYSTANE OP  Place 1 drop into both eyes daily as needed (dry eyes).     terazosin 10 MG capsule  Commonly known as:  HYTRIN  Take 1 tablet by mouth Daily.     UNISOM PO  Take 1 tablet by mouth at bedtime as needed.        Discharge Instructions:     Follow-up Information    Follow up with ROSENBOWER,TODD J, MD. Schedule an appointment as soon as possible for a visit in 3 weeks.   Specialty:  General Surgery   Contact information:   [address] [place] [address] [phone]       Signed: Henreitta Cea 11/20/2014, 12:03 PM

## 2014-11-20 NOTE — Progress Notes (Signed)
Student nurse went over discharge instructions with patient and spouse present. Home medications gone over. Prescriptions given. Follow up appointment is to be made. Diet, activity, and reasons to call the doctor gone over. Incisional care gone over. Patient verbalized understanding of instructions.

## 2014-11-20 NOTE — Plan of Care (Signed)
Problem: Discharge Progression Outcomes Goal: Discharge plan in place and appropriate Outcome: Completed/Met Date Met:  11/20/14 Goal: Pain controlled with appropriate interventions Outcome: Completed/Met Date Met:  11/20/14 Goal: Hemodynamically stable Outcome: Completed/Met Date Met:  01/00/71 Goal: Complications resolved/controlled Outcome: Completed/Met Date Met:  11/20/14 Goal: Tolerating diet Outcome: Completed/Met Date Met:  11/20/14 Goal: Activity appropriate for discharge plan Outcome: Completed/Met Date Met:  11/20/14 Goal: Tubes and drains discontinued if indicated Outcome: Not Applicable Date Met:  21/97/58 Goal: Staples/sutures removed Outcome: Not Applicable Date Met:  83/25/49 Goal: Steri-Strips applied Outcome: Not Applicable Date Met:  82/64/15

## 2014-11-23 ENCOUNTER — Encounter (HOSPITAL_COMMUNITY): Payer: Self-pay | Admitting: General Surgery

## 2014-11-30 DIAGNOSIS — Z0000 Encounter for general adult medical examination without abnormal findings: Secondary | ICD-10-CM | POA: Diagnosis not present

## 2014-12-01 ENCOUNTER — Ambulatory Visit (INDEPENDENT_AMBULATORY_CARE_PROVIDER_SITE_OTHER): Payer: Medicare Other | Admitting: Cardiovascular Disease

## 2014-12-01 ENCOUNTER — Encounter: Payer: Self-pay | Admitting: Cardiovascular Disease

## 2014-12-01 VITALS — BP 122/70 | HR 74 | Ht 71.0 in | Wt 158.0 lb

## 2014-12-01 DIAGNOSIS — R9431 Abnormal electrocardiogram [ECG] [EKG]: Secondary | ICD-10-CM | POA: Diagnosis not present

## 2014-12-01 DIAGNOSIS — R002 Palpitations: Secondary | ICD-10-CM

## 2014-12-01 DIAGNOSIS — K219 Gastro-esophageal reflux disease without esophagitis: Secondary | ICD-10-CM | POA: Diagnosis not present

## 2014-12-01 DIAGNOSIS — K8020 Calculus of gallbladder without cholecystitis without obstruction: Secondary | ICD-10-CM | POA: Diagnosis not present

## 2014-12-01 DIAGNOSIS — H9193 Unspecified hearing loss, bilateral: Secondary | ICD-10-CM | POA: Diagnosis not present

## 2014-12-01 DIAGNOSIS — I10 Essential (primary) hypertension: Secondary | ICD-10-CM | POA: Diagnosis not present

## 2014-12-01 DIAGNOSIS — Z23 Encounter for immunization: Secondary | ICD-10-CM | POA: Diagnosis not present

## 2014-12-01 NOTE — Assessment & Plan Note (Signed)
S/P laparoscopic cholecystectomy with good symptomatic relief

## 2014-12-01 NOTE — Progress Notes (Signed)
Patient ID: Jared Neal, male   DOB: [birth year], 80 y.o.   MRN: [id number] 80 yo referred by Dr Shelia Media in 2013 . Has had HTN for over 30 years. On two drug Rx. Had cold recently and BP was elevated. Was taking alkaseltzer cold medicine and nyquil. Compliant with meds. Exercises daily with no chest pain or dyspnea. BP is getting better over the last two weeks as cold subsided. History of palpitations. Now quiescent. No history of PAF. No history of kidney disease. No other history of PVD or vascular disease. ECG 11/23 with lateral T wave changes Has had bad reaction to procardia in past but not to any other BP meds Holter showing on ly PAC;s and PVC;s done 06/2012   Walks 2-4 miles/day on trails Does not notice PVC;s No chest pain or dyspnea   Echo 1/15 Study Conclusions  - Left ventricle: The cavity size was normal. There was mild focal basal and moderate concentric hypertrophy of the septum without evidence of LVOT obstruction. Systolic function was normal. The estimated ejection fraction was in the range of 60% to 65%. Wall motion was normal; there were no regional wall motion abnormalities. Doppler parameters are consistent with abnormal left ventricular relaxation (grade 1 diastolic dysfunction). Doppler parameters are consistent with high ventricular filling pressure. - Mitral valve: Mild regurgitation. - Left atrium: The atrium was moderately dilated. - Right ventricle: The cavity size was moderately dilated. Wall thickness was normal. Systolic function was normal. - Right atrium: The atrium was mildly dilated. - Atrial septum: No defect or patent foramen ovale was identified. - Tricuspid valve: No regurgitation.  Since I last saw him had successful cataract surgery and GB removed by Dr Zella Richer    ROS: Denies fever, malais, weight loss, blurry vision, decreased visual acuity, cough, sputum, SOB, hemoptysis, pleuritic pain, palpitaitons, heartburn,  abdominal pain, melena, lower extremity edema, claudication, or rash.  All other systems reviewed and negative  General: Affect appropriate Healthy:  appears stated age 80: normal Neck supple with no adenopathy JVP normal no bruits no thyromegaly Lungs clear with no wheezing and good diaphragmatic motion Heart:  S1/S2 no murmur, no rub, gallop or click PMI normal Abdomen: benighn, BS positve, no tenderness, no AAA lab choly scars  no bruit.  No HSM or HJR Distal pulses intact with no bruits No edema Neuro non-focal Skin warm and dry No muscular weakness   Current Outpatient Prescriptions  Medication Sig Dispense Refill  . amLODipine (NORVASC) 5 MG tablet Take 1 tablet by mouth Daily.    . clotrimazole (LOTRIMIN) 1 % cream Apply 1 application topically 2 (two) times daily. (Patient not taking: Reported on 11/08/2014) 30 g 0  . Doxylamine Succinate, Sleep, (UNISOM PO) Take 1 tablet by mouth at bedtime as needed.    Marland Kitchen HYDROcodone-acetaminophen (NORCO/VICODIN) 5-325 MG per tablet Take 1-2 tablets by mouth every 4 (four) hours as needed for moderate pain or severe pain. 30 tablet 0  . HYDROcodone-acetaminophen (NORCO/VICODIN) 5-325 MG per tablet Take 1-2 tablets by mouth every 4 (four) hours as needed for moderate pain. 30 tablet 0  . oxymetazoline (AFRIN) 0.05 % nasal spray Place 1 spray into both nostrils 2 (two) times daily.    . pantoprazole (PROTONIX) 40 MG tablet Take 40 mg by mouth Daily.     [name] Glycol-Propyl Glycol (SYSTANE OP) Place 1 drop into both eyes daily as needed (dry eyes).    . Pramoxine-Dimethicone (GOLD BOND INTENSIVE HEALING) 1-6 % CREA Apply 1 application topically  as needed.    . ranitidine (ZANTAC) 150 MG tablet Take 150 mg by mouth 2 (two) times daily.   0  . terazosin (HYTRIN) 10 MG capsule Take 1 tablet by mouth Daily.     No current facility-administered medications for this visit.    Allergies  Procardia  Electrocardiogram:  12/14  SR rate  77  PVC;s lateral T wave changes  SR rate 69  Anterolateral T wave changes persist   Assessment and Plan

## 2014-12-01 NOTE — Assessment & Plan Note (Signed)
Chronically abnormal repolarization abnormalities no syncope or chest pain stable

## 2014-12-01 NOTE — Addendum Note (Signed)
Addended by: Devra Dopp E on: 12/01/2014 12:14 PM   Modules accepted: Level of Service

## 2014-12-01 NOTE — Assessment & Plan Note (Signed)
Well controlled.  Continue current medications and low sodium Dash type diet.    

## 2014-12-01 NOTE — Patient Instructions (Signed)
Your physician wants you to follow-up in: YEAR WITH DR NISHAN  You will receive a reminder letter in the mail two months in advance. If you don't receive a letter, please call our office to schedule the follow-up appointment.  Your physician recommends that you continue on your current medications as directed. Please refer to the Current Medication list given to you today. 

## 2014-12-01 NOTE — Assessment & Plan Note (Signed)
Resolved continue low dose beta blocker

## 2014-12-07 DIAGNOSIS — H4720 Unspecified optic atrophy: Secondary | ICD-10-CM | POA: Diagnosis not present

## 2014-12-07 DIAGNOSIS — Z961 Presence of intraocular lens: Secondary | ICD-10-CM | POA: Diagnosis not present

## 2014-12-07 DIAGNOSIS — H4011X2 Primary open-angle glaucoma, moderate stage: Secondary | ICD-10-CM | POA: Diagnosis not present

## 2014-12-07 DIAGNOSIS — H40011 Open angle with borderline findings, low risk, right eye: Secondary | ICD-10-CM | POA: Diagnosis not present

## 2015-03-17 DIAGNOSIS — N281 Cyst of kidney, acquired: Secondary | ICD-10-CM | POA: Diagnosis not present

## 2015-03-17 DIAGNOSIS — N401 Enlarged prostate with lower urinary tract symptoms: Secondary | ICD-10-CM | POA: Diagnosis not present

## 2015-03-17 DIAGNOSIS — R3912 Poor urinary stream: Secondary | ICD-10-CM | POA: Diagnosis not present

## 2015-09-12 DIAGNOSIS — Z23 Encounter for immunization: Secondary | ICD-10-CM | POA: Diagnosis not present

## 2015-12-20 DIAGNOSIS — Z961 Presence of intraocular lens: Secondary | ICD-10-CM | POA: Diagnosis not present

## 2015-12-20 DIAGNOSIS — H401122 Primary open-angle glaucoma, left eye, moderate stage: Secondary | ICD-10-CM | POA: Diagnosis not present

## 2015-12-20 DIAGNOSIS — H40011 Open angle with borderline findings, low risk, right eye: Secondary | ICD-10-CM | POA: Diagnosis not present

## 2016-01-06 DIAGNOSIS — Z0000 Encounter for general adult medical examination without abnormal findings: Secondary | ICD-10-CM | POA: Diagnosis not present

## 2016-01-06 DIAGNOSIS — I10 Essential (primary) hypertension: Secondary | ICD-10-CM | POA: Diagnosis not present

## 2016-01-07 NOTE — Progress Notes (Signed)
Patient ID: Jared Neal, male   DOB: 1929/12/24, 82 y.o.   MRN: [id number]   82 y.o.  referred by Dr Shelia Media in 2013 . Has had HTN for over 30 years. On two drug Rx. Had cold recently and BP was elevated. Was taking alkaseltzer cold medicine and nyquil. Compliant with meds. Exercises daily with no chest pain or dyspnea. BP is getting better over the last two weeks as cold subsided. History of palpitations. Now quiescent. No history of PAF. No history of kidney disease. No other history of PVD or vascular disease. ECG 11/23 with lateral T wave changes Has had bad reaction to procardia in past but not to any other BP meds Holter showing on ly PAC;s and PVC;s done 06/2012   Walks 2-4 miles/day on trails Does not notice PVC;s No chest pain or dyspnea   Echo 1/15 Study Conclusions  - Left ventricle: The cavity size was normal. There was mild focal basal and moderate concentric hypertrophy of the septum without evidence of LVOT obstruction. Systolic function was normal. The estimated ejection fraction was in the range of 60% to 65%. Wall motion was normal; there were no regional wall motion abnormalities. Doppler parameters are consistent with abnormal left ventricular relaxation (grade 1 diastolic dysfunction). Doppler parameters are consistent with high ventricular filling pressure. - Mitral valve: Mild regurgitation. - Left atrium: The atrium was moderately dilated. - Right ventricle: The cavity size was moderately dilated. Wall thickness was normal. Systolic function was normal. - Right atrium: The atrium was mildly dilated. - Atrial septum: No defect or patent foramen ovale was identified. - Tricuspid valve: No regurgitation.  Since I last saw him had successful cataract surgery and GB removed by Dr Zella Richer  Has some discriminatory hearing loss    ROS: Denies fever, malais, weight loss, blurry vision, decreased visual acuity, cough, sputum, SOB, hemoptysis,  pleuritic pain, palpitaitons, heartburn, abdominal pain, melena, lower extremity edema, claudication, or rash.  All other systems reviewed and negative  General: Affect appropriate Healthy:  appears stated age 53: normal Neck supple with no adenopathy JVP normal no bruits no thyromegaly Lungs clear with no wheezing and good diaphragmatic motion Heart:  S1/S2 no murmur, no rub, gallop or click PMI normal Abdomen: benighn, BS positve, no tenderness, no AAA lab choly scars  no bruit.  No HSM or HJR Distal pulses intact with no bruits No edema Neuro non-focal Skin warm and dry No muscular weakness   Current Outpatient Prescriptions  Medication Sig Dispense Refill  . amLODipine (NORVASC) 5 MG tablet Take 1 tablet by mouth Daily.    . clotrimazole (LOTRIMIN) 1 % cream Apply 1 application topically 2 (two) times daily. 30 g 0  . Doxylamine Succinate, Sleep, (UNISOM PO) Take 1 tablet by mouth at bedtime as needed.    Marland Kitchen oxymetazoline (AFRIN) 0.05 % nasal spray Place 1 spray into both nostrils 2 (two) times daily.    . pantoprazole (PROTONIX) 40 MG tablet Take 40 mg by mouth Daily.     [name] Glycol-Propyl Glycol (SYSTANE OP) Place 1 drop into both eyes daily as needed (dry eyes).    . ranitidine (ZANTAC) 150 MG tablet Take 150 mg by mouth 2 (two) times daily.   0  . terazosin (HYTRIN) 10 MG capsule Take 1 tablet by mouth Daily.    . urea (CARMOL) 10 % cream Apply topically at bedtime.     No current facility-administered medications for this visit.    Allergies  Procardia  Electrocardiogram:  12/14  SR rate 77  PVC;s lateral T wave changes  SR rate 69  Anterolateral T wave changes persist  01/10/16 SR rate 67 LVH with lateral T wave changes unchanged   Assessment and Plan  HTN:  Well controlled.  Continue current medications and low sodium Dash type diet.   PVC;s:  Asymptomatic normal EF no history of CAD and active  Abnormal ECG: chronic ? Related to HTN   Hearing:   Encouraged him to see an audiologist   Jenkins Rouge

## 2016-01-10 ENCOUNTER — Ambulatory Visit (INDEPENDENT_AMBULATORY_CARE_PROVIDER_SITE_OTHER): Payer: Medicare Other | Admitting: Cardiovascular Disease

## 2016-01-10 ENCOUNTER — Encounter: Payer: Self-pay | Admitting: Cardiovascular Disease

## 2016-01-10 VITALS — BP 130/70 | HR 70 | Ht 71.0 in | Wt 159.2 lb

## 2016-01-10 DIAGNOSIS — I10 Essential (primary) hypertension: Secondary | ICD-10-CM

## 2016-01-10 NOTE — Patient Instructions (Signed)
Medication Instructions:  Your physician recommends that you continue on your current medications as directed. Please refer to the Current Medication list given to you today.  Labwork: NONE  Testing/Procedures: NONE  Follow-Up: Your physician wants you to follow-up in: 12 months with Dr. Johnsie Cancel. You will receive a reminder letter in the mail two months in advance. If you don't receive a letter, please call our office to schedule the follow-up appointment.   Any Other Special Instructions Will Be Listed Below (If Applicable).     If you need a refill on your cardiac medications before your next appointment, please call your pharmacy.

## 2016-01-11 DIAGNOSIS — Z8673 Personal history of transient ischemic attack (TIA), and cerebral infarction without residual deficits: Secondary | ICD-10-CM | POA: Diagnosis not present

## 2016-01-11 DIAGNOSIS — Q619 Cystic kidney disease, unspecified: Secondary | ICD-10-CM | POA: Diagnosis not present

## 2016-01-11 DIAGNOSIS — N401 Enlarged prostate with lower urinary tract symptoms: Secondary | ICD-10-CM | POA: Diagnosis not present

## 2016-01-11 DIAGNOSIS — I10 Essential (primary) hypertension: Secondary | ICD-10-CM | POA: Diagnosis not present

## 2016-04-24 DIAGNOSIS — B351 Tinea unguium: Secondary | ICD-10-CM | POA: Diagnosis not present

## 2016-04-24 DIAGNOSIS — L218 Other seborrheic dermatitis: Secondary | ICD-10-CM | POA: Diagnosis not present

## 2016-04-24 DIAGNOSIS — L821 Other seborrheic keratosis: Secondary | ICD-10-CM | POA: Diagnosis not present

## 2016-08-27 ENCOUNTER — Other Ambulatory Visit: Payer: Self-pay

## 2016-10-12 DIAGNOSIS — Z23 Encounter for immunization: Secondary | ICD-10-CM | POA: Diagnosis not present

## 2016-11-13 DIAGNOSIS — H903 Sensorineural hearing loss, bilateral: Secondary | ICD-10-CM | POA: Diagnosis not present

## 2016-12-18 DIAGNOSIS — H60332 Swimmer's ear, left ear: Secondary | ICD-10-CM | POA: Diagnosis not present

## 2017-01-02 DIAGNOSIS — H903 Sensorineural hearing loss, bilateral: Secondary | ICD-10-CM | POA: Diagnosis not present

## 2017-01-02 DIAGNOSIS — H60332 Swimmer's ear, left ear: Secondary | ICD-10-CM | POA: Diagnosis not present

## 2017-02-05 DIAGNOSIS — Z0000 Encounter for general adult medical examination without abnormal findings: Secondary | ICD-10-CM | POA: Diagnosis not present

## 2017-02-05 DIAGNOSIS — I10 Essential (primary) hypertension: Secondary | ICD-10-CM | POA: Diagnosis not present

## 2017-02-07 ENCOUNTER — Other Ambulatory Visit: Payer: Self-pay | Admitting: Internal Medicine

## 2017-02-07 DIAGNOSIS — N281 Cyst of kidney, acquired: Secondary | ICD-10-CM

## 2017-02-07 DIAGNOSIS — I10 Essential (primary) hypertension: Secondary | ICD-10-CM | POA: Diagnosis not present

## 2017-02-07 DIAGNOSIS — N401 Enlarged prostate with lower urinary tract symptoms: Secondary | ICD-10-CM | POA: Diagnosis not present

## 2017-02-07 DIAGNOSIS — L309 Dermatitis, unspecified: Secondary | ICD-10-CM | POA: Diagnosis not present

## 2017-02-07 DIAGNOSIS — I6523 Occlusion and stenosis of bilateral carotid arteries: Secondary | ICD-10-CM | POA: Diagnosis not present

## 2017-02-13 DIAGNOSIS — N281 Cyst of kidney, acquired: Secondary | ICD-10-CM | POA: Diagnosis not present

## 2017-02-13 DIAGNOSIS — R3912 Poor urinary stream: Secondary | ICD-10-CM | POA: Diagnosis not present

## 2017-02-13 DIAGNOSIS — N401 Enlarged prostate with lower urinary tract symptoms: Secondary | ICD-10-CM | POA: Diagnosis not present

## 2017-02-18 ENCOUNTER — Ambulatory Visit
Admission: RE | Admit: 2017-02-18 | Discharge: 2017-02-18 | Disposition: A | Discharge status: Home / Self Care | Payer: Medicare Other | Source: Ambulatory Visit | Attending: Internal Medicine | Admitting: Internal Medicine

## 2017-02-18 DIAGNOSIS — N281 Cyst of kidney, acquired: Secondary | ICD-10-CM

## 2017-02-19 DIAGNOSIS — B353 Tinea pedis: Secondary | ICD-10-CM | POA: Diagnosis not present

## 2017-02-19 DIAGNOSIS — B356 Tinea cruris: Secondary | ICD-10-CM | POA: Diagnosis not present

## 2017-02-19 NOTE — Progress Notes (Deleted)
Patient ID: Jared Neal, male   DOB: [birth year], 83 y.o.   MRN: [id number]   83 y.o.  referred by Dr Shelia Media in 2013 . Has had HTN for over 30 years. On two drug Rx. Labile when she gets cold/URI Compliant with meds. Exercises daily with no chest pain or dyspnea.. History of palpitations. Now quiescent. No history of PAF. No history of kidney disease. No other history of PVD or vascular disease. ECG 11/23 with lateral T wave changes Has had bad reaction to procardia in past but not to any other BP meds Holter showing only PAC;s and PVC;s done 06/2012   Walks 2-4 miles/day on trails Does not notice PVC;s No chest pain or dyspnea   Echo 1/15 Study Conclusions  - Left ventricle: The cavity size was normal. There was mild focal basal and moderate concentric hypertrophy of the septum without evidence of LVOT obstruction. Systolic function was normal. The estimated ejection fraction was in the range of 60% to 65%. Wall motion was normal; there were no regional wall motion abnormalities. Doppler parameters are consistent with abnormal left ventricular relaxation (grade 1 diastolic dysfunction). Doppler parameters are consistent with high ventricular filling pressure. - Mitral valve: Mild regurgitation. - Left atrium: The atrium was moderately dilated. - Right ventricle: The cavity size was moderately dilated. Wall thickness was normal. Systolic function was normal. - Right atrium: The atrium was mildly dilated. - Atrial septum: No defect or patent foramen ovale was identified. - Tricuspid valve: No regurgitation.  Abdominal US 02/18/17 with renal cycsts no other real pathology    ROS: Denies fever, malais, weight loss, blurry vision, decreased visual acuity, cough, sputum, SOB, hemoptysis, pleuritic pain, palpitaitons, heartburn, abdominal pain, melena, lower extremity edema, claudication, or rash.  All other systems reviewed and negative  General: Affect  appropriate Healthy:  appears stated age 51: normal Neck supple with no adenopathy JVP normal no bruits no thyromegaly Lungs clear with no wheezing and good diaphragmatic motion Heart:  S1/S2 no murmur, no rub, gallop or click PMI normal Abdomen: benighn, BS positve, no tenderness, no AAA lab choly scars  no bruit.  No HSM or HJR Distal pulses intact with no bruits No edema Neuro non-focal Skin warm and dry No muscular weakness   Current Outpatient Prescriptions  Medication Sig Dispense Refill  . amLODipine (NORVASC) 5 MG tablet Take 1 tablet by mouth Daily.    . clotrimazole (LOTRIMIN) 1 % cream Apply 1 application topically 2 (two) times daily. 30 g 0  . Doxylamine Succinate, Sleep, (UNISOM PO) Take 1 tablet by mouth at bedtime as needed.    Marland Kitchen oxymetazoline (AFRIN) 0.05 % nasal spray Place 1 spray into both nostrils 2 (two) times daily.    . pantoprazole (PROTONIX) 40 MG tablet Take 40 mg by mouth Daily.     [name] Glycol-Propyl Glycol (SYSTANE OP) Place 1 drop into both eyes daily as needed (dry eyes).    . ranitidine (ZANTAC) 150 MG tablet Take 150 mg by mouth 2 (two) times daily.   0  . terazosin (HYTRIN) 10 MG capsule Take 1 tablet by mouth Daily.    . urea (CARMOL) 10 % cream Apply topically at bedtime.     No current facility-administered medications for this visit.     Allergies  Procardia [nifedipine]  Electrocardiogram:  12/14  SR rate 77  PVC;s lateral T wave changes  SR rate 69  Anterolateral T wave changes persist  01/10/16 SR rate 67 LVH with lateral T  wave changes unchanged   Assessment and Plan  HTN:  Well controlled.  Continue current medications and low sodium Dash type diet.   PVC;s:  Asymptomatic normal EF no history of CAD and active  Abnormal ECG: chronic ? Related to HTN   Hearing:  Encouraged him to see an audiologist  GERD:  Continue low carb diet and protonix  Renal Cysts: chronic benign appearing on recent US f/u Dr Wells Guiles

## 2017-02-20 ENCOUNTER — Encounter: Payer: Self-pay | Admitting: Cardiovascular Disease

## 2017-03-01 ENCOUNTER — Ambulatory Visit: Payer: Medicare Other | Admitting: Cardiovascular Disease

## 2017-03-07 DIAGNOSIS — J069 Acute upper respiratory infection, unspecified: Secondary | ICD-10-CM | POA: Diagnosis not present

## 2017-03-07 DIAGNOSIS — Q6100 Congenital renal cyst, unspecified: Secondary | ICD-10-CM | POA: Diagnosis not present

## 2017-03-07 DIAGNOSIS — N401 Enlarged prostate with lower urinary tract symptoms: Secondary | ICD-10-CM | POA: Diagnosis not present

## 2017-03-07 DIAGNOSIS — I10 Essential (primary) hypertension: Secondary | ICD-10-CM | POA: Diagnosis not present

## 2017-03-13 DIAGNOSIS — H04123 Dry eye syndrome of bilateral lacrimal glands: Secondary | ICD-10-CM | POA: Diagnosis not present

## 2017-03-13 DIAGNOSIS — H40011 Open angle with borderline findings, low risk, right eye: Secondary | ICD-10-CM | POA: Diagnosis not present

## 2017-03-13 DIAGNOSIS — H11152 Pinguecula, left eye: Secondary | ICD-10-CM | POA: Diagnosis not present

## 2017-03-13 DIAGNOSIS — H401122 Primary open-angle glaucoma, left eye, moderate stage: Secondary | ICD-10-CM | POA: Diagnosis not present

## 2017-03-13 DIAGNOSIS — H10413 Chronic giant papillary conjunctivitis, bilateral: Secondary | ICD-10-CM | POA: Diagnosis not present

## 2017-03-13 DIAGNOSIS — H0289 Other specified disorders of eyelid: Secondary | ICD-10-CM | POA: Diagnosis not present

## 2017-03-13 DIAGNOSIS — Z961 Presence of intraocular lens: Secondary | ICD-10-CM | POA: Diagnosis not present

## 2017-04-12 NOTE — Progress Notes (Signed)
Patient ID: Jared Neal, male   DOB: [birth year], 83 y.o.   MRN: [id number]   83 y.o.  referred by Dr Shelia Media in 2013 . Has had HTN for over 30 years. On two drug Rx. Sometimes spikes with cold meds. Compliant with meds. Exercises daily with no chest pain or dyspnea.  History of palpitations. Now quiescent. No history of PAF. No history of kidney disease. No other history of PVD or vascular disease. ECG 11/23 with lateral T wave changes Has had bad reaction to procardia in past but not to any other BP meds Holter showing on ly PAC;s and PVC;s done 06/2012   Walks 2-4 miles/day on trails Does not notice PVC;s No chest pain or dyspnea   Echo 1/15 Study Conclusions  - Left ventricle: The cavity size was normal. There was mild focal basal and moderate concentric hypertrophy of the septum without evidence of LVOT obstruction. Systolic function was normal. The estimated ejection fraction was in the range of 60% to 65%. Wall motion was normal; there were no regional wall motion abnormalities. Doppler parameters are consistent with abnormal left ventricular relaxation (grade 1 diastolic dysfunction). Doppler parameters are consistent with high ventricular filling pressure. - Mitral valve: Mild regurgitation. - Left atrium: The atrium was moderately dilated. - Right ventricle: The cavity size was moderately dilated. Wall thickness was normal. Systolic function was normal. - Right atrium: The atrium was mildly dilated. - Atrial septum: No defect or patent foramen ovale was identified. - Tricuspid valve: No regurgitation.     ROS: Denies fever, malais, weight loss, blurry vision, decreased visual acuity, cough, sputum, SOB, hemoptysis, pleuritic pain, palpitaitons, heartburn, abdominal pain, melena, lower extremity edema, claudication, or rash.  All other systems reviewed and negative  General: Affect appropriate Healthy:  appears stated age HEENT Poor hearing  Neck  supple with no adenopathy JVP normal no bruits no thyromegaly Lungs clear with no wheezing and good diaphragmatic motion Heart:  S1/S2 no murmur, no rub, gallop or click PMI normal Abdomen: benighn, BS positve, no tenderness, no AAA lab choly scars  no bruit.  No HSM or HJR Distal pulses intact with no bruits No edema Neuro non-focal Skin warm and dry No muscular weakness   Current Outpatient Prescriptions  Medication Sig Dispense Refill  . amLODipine (NORVASC) 5 MG tablet Take 1 tablet by mouth Daily.    . clotrimazole (LOTRIMIN) 1 % cream Apply 1 application topically 2 (two) times daily. 30 g 0  . Doxylamine Succinate, Sleep, (UNISOM PO) Take 1 tablet by mouth at bedtime as needed.    Marland Kitchen oxymetazoline (AFRIN) 0.05 % nasal spray Place 1 spray into both nostrils 2 (two) times daily.    . pantoprazole (PROTONIX) 40 MG tablet Take 40 mg by mouth Daily.     [name] Glycol-Propyl Glycol (SYSTANE OP) Place 1 drop into both eyes daily as needed (dry eyes).    . ranitidine (ZANTAC) 150 MG tablet Take 150 mg by mouth 2 (two) times daily.   0  . terazosin (HYTRIN) 10 MG capsule Take 1 tablet by mouth Daily.    . urea (CARMOL) 10 % cream Apply topically at bedtime.     No current facility-administered medications for this visit.     Allergies  Procardia [nifedipine]  Electrocardiogram:  12/14  SR rate 77  PVC;s lateral T wave changes  SR rate 69  Anterolateral T wave changes persist  01/10/16 SR rate 67 LVH with lateral T wave changes unchanged  4/17.18  SR rate 73 PVC lateral T wave changes   Assessment and Plan  HTN:  Well controlled.  Continue current medications and low sodium Dash type diet.   PVC;s:  Asymptomatic normal EF no history of CAD and active  Abnormal ECG: chronic ? Related to HTN  No change today  Hearing:  Encouraged him to see an audiologist   Jenkins Rouge

## 2017-04-16 ENCOUNTER — Encounter: Payer: Self-pay | Admitting: Cardiovascular Disease

## 2017-04-16 ENCOUNTER — Ambulatory Visit (INDEPENDENT_AMBULATORY_CARE_PROVIDER_SITE_OTHER): Payer: Medicare Other | Admitting: Cardiovascular Disease

## 2017-04-16 VITALS — BP 160/90 | HR 67 | Ht 71.0 in | Wt 155.8 lb

## 2017-04-16 DIAGNOSIS — I10 Essential (primary) hypertension: Secondary | ICD-10-CM

## 2017-04-16 NOTE — Patient Instructions (Signed)

## 2017-04-18 NOTE — Addendum Note (Signed)
Addended by: Roberts Gaudy on: 04/18/2017 09:15 AM   Modules accepted: Orders

## 2017-10-10 DIAGNOSIS — Z23 Encounter for immunization: Secondary | ICD-10-CM | POA: Diagnosis not present

## 2017-10-17 DIAGNOSIS — Z961 Presence of intraocular lens: Secondary | ICD-10-CM | POA: Diagnosis not present

## 2017-10-17 DIAGNOSIS — H40011 Open angle with borderline findings, low risk, right eye: Secondary | ICD-10-CM | POA: Diagnosis not present

## 2017-10-17 DIAGNOSIS — H04123 Dry eye syndrome of bilateral lacrimal glands: Secondary | ICD-10-CM | POA: Diagnosis not present

## 2017-10-17 DIAGNOSIS — H401122 Primary open-angle glaucoma, left eye, moderate stage: Secondary | ICD-10-CM | POA: Diagnosis not present

## 2017-10-17 DIAGNOSIS — H10413 Chronic giant papillary conjunctivitis, bilateral: Secondary | ICD-10-CM | POA: Diagnosis not present

## 2017-11-12 DIAGNOSIS — H838X3 Other specified diseases of inner ear, bilateral: Secondary | ICD-10-CM | POA: Diagnosis not present

## 2017-11-12 DIAGNOSIS — H903 Sensorineural hearing loss, bilateral: Secondary | ICD-10-CM | POA: Diagnosis not present

## 2017-11-28 DIAGNOSIS — H10413 Chronic giant papillary conjunctivitis, bilateral: Secondary | ICD-10-CM | POA: Diagnosis not present

## 2017-11-28 DIAGNOSIS — H04123 Dry eye syndrome of bilateral lacrimal glands: Secondary | ICD-10-CM | POA: Diagnosis not present

## 2017-11-28 DIAGNOSIS — H401122 Primary open-angle glaucoma, left eye, moderate stage: Secondary | ICD-10-CM | POA: Diagnosis not present

## 2017-11-28 DIAGNOSIS — H0289 Other specified disorders of eyelid: Secondary | ICD-10-CM | POA: Diagnosis not present

## 2017-11-28 DIAGNOSIS — H40011 Open angle with borderline findings, low risk, right eye: Secondary | ICD-10-CM | POA: Diagnosis not present

## 2017-11-28 DIAGNOSIS — Z961 Presence of intraocular lens: Secondary | ICD-10-CM | POA: Diagnosis not present

## 2018-03-03 DIAGNOSIS — I10 Essential (primary) hypertension: Secondary | ICD-10-CM | POA: Diagnosis not present

## 2018-03-06 DIAGNOSIS — M19049 Primary osteoarthritis, unspecified hand: Secondary | ICD-10-CM | POA: Diagnosis not present

## 2018-03-06 DIAGNOSIS — K219 Gastro-esophageal reflux disease without esophagitis: Secondary | ICD-10-CM | POA: Diagnosis not present

## 2018-03-06 DIAGNOSIS — J449 Chronic obstructive pulmonary disease, unspecified: Secondary | ICD-10-CM | POA: Diagnosis not present

## 2018-03-06 DIAGNOSIS — Z1212 Encounter for screening for malignant neoplasm of rectum: Secondary | ICD-10-CM | POA: Diagnosis not present

## 2018-03-06 DIAGNOSIS — N529 Male erectile dysfunction, unspecified: Secondary | ICD-10-CM | POA: Diagnosis not present

## 2018-03-06 DIAGNOSIS — M19041 Primary osteoarthritis, right hand: Secondary | ICD-10-CM | POA: Diagnosis not present

## 2018-03-06 DIAGNOSIS — R9431 Abnormal electrocardiogram [ECG] [EKG]: Secondary | ICD-10-CM | POA: Diagnosis not present

## 2018-03-06 DIAGNOSIS — Q6100 Congenital renal cyst, unspecified: Secondary | ICD-10-CM | POA: Diagnosis not present

## 2018-03-06 DIAGNOSIS — I6523 Occlusion and stenosis of bilateral carotid arteries: Secondary | ICD-10-CM | POA: Diagnosis not present

## 2018-03-06 DIAGNOSIS — L309 Dermatitis, unspecified: Secondary | ICD-10-CM | POA: Diagnosis not present

## 2018-03-06 DIAGNOSIS — R002 Palpitations: Secondary | ICD-10-CM | POA: Diagnosis not present

## 2018-03-06 DIAGNOSIS — H9193 Unspecified hearing loss, bilateral: Secondary | ICD-10-CM | POA: Diagnosis not present

## 2018-03-06 DIAGNOSIS — Z0000 Encounter for general adult medical examination without abnormal findings: Secondary | ICD-10-CM | POA: Diagnosis not present

## 2018-03-06 DIAGNOSIS — I10 Essential (primary) hypertension: Secondary | ICD-10-CM | POA: Diagnosis not present

## 2018-03-11 DIAGNOSIS — M7989 Other specified soft tissue disorders: Secondary | ICD-10-CM | POA: Diagnosis not present

## 2018-03-11 DIAGNOSIS — M1180 Other specified crystal arthropathies, unspecified site: Secondary | ICD-10-CM | POA: Diagnosis not present

## 2018-03-11 DIAGNOSIS — M1990 Unspecified osteoarthritis, unspecified site: Secondary | ICD-10-CM | POA: Diagnosis not present

## 2018-03-11 DIAGNOSIS — M79644 Pain in right finger(s): Secondary | ICD-10-CM | POA: Diagnosis not present

## 2018-04-18 ENCOUNTER — Telehealth: Payer: Self-pay | Admitting: Cardiovascular Disease

## 2018-04-18 NOTE — Telephone Encounter (Signed)
Patient c/o Palpitations:  High priority if patient c/o lightheadedness, shortness of breath, or chest pain  1) How long have you had palpitations/irregular HR/ Afib? Are you having the symptoms now? Yes irregular  yes  2) Are you currently experiencing lightheadedness, SOB or CP? no  3) Do you have a history of afib (atrial fibrillation) or irregular heart rhythm?  yes  4) Have you checked your BP or HR? (document readings if available):  135/80   Hr 60  5) Are you experiencing any other symptoms? No other symptoms

## 2018-04-18 NOTE — Telephone Encounter (Signed)
Called patient about his message. Patient had palpitations 5 days ago and lasted only few minutes. Patient's stated that he had only one occurance since then, that did not last but a few seconds. Patient's wife stated patient had a heard time sleeping the night that he had palpitations, and has been tired since. Made patient an appointment with Dr. Johnsie Cancel at next available appointment, 05/01/18. Encouraged patient if he has any more episodes to give our office a call and our office will see if we can get him an earlier appointment. Patient verbalized understanding.

## 2018-04-28 NOTE — Progress Notes (Signed)
Patient ID: Jared Neal, male   DOB: 05-06-1929, 84 y.o.   MRN: [id number]   84 y.o.  referred by Dr Shelia Media in 2013 . Has had HTN for over 30 years. On two drug Rx. Sometimes spikes with cold meds. Compliant with meds.  History of palpitations.Most recent for a few seconds when tired April 2019 No history of PAF. No history of kidney disease. No other history of PVD or vascular disease. ECG 11/23 with lateral T wave changes Has had bad reaction to procardia in past but not to any other BP meds Holter showing only  PAC;s and PVC;s done 06/2012   Walks 2-4 miles/day on trails Does not notice PVC;s No chest pain or dyspnea   Echo 1/15 Study Conclusions  - Left ventricle: The cavity size was normal. There was mild focal basal and moderate concentric hypertrophy of the septum without evidence of LVOT obstruction. Systolic function was normal. The estimated ejection fraction was in the range of 60% to 65%. Wall motion was normal; there were no regional wall motion abnormalities. Doppler parameters are consistent with abnormal left ventricular relaxation (grade 1 diastolic dysfunction). Doppler parameters are consistent with high ventricular filling pressure. - Mitral valve: Mild regurgitation. - Left atrium: The atrium was moderately dilated. - Right ventricle: The cavity size was moderately dilated. Wall thickness was normal. Systolic function was normal. - Right atrium: The atrium was mildly dilated. - Atrial septum: No defect or patent foramen ovale was identified. - Tricuspid valve: No regurgitation.   Has one son in [place] pianist married to opera singer with 76 yo grandaughter One daughter in [place] who does life coaching  Had one bad day of palpitations since I saw him a year ago but nothing else  Labs with primary have been normal   ROS: Denies fever, malais, weight loss, blurry vision, decreased visual acuity, cough, sputum, SOB, hemoptysis, pleuritic pain,  palpitaitons, heartburn, abdominal pain, melena, lower extremity edema, claudication, or rash.  All other systems reviewed and negative  General: BP 128/74   Pulse 80   Ht 5\' 11"  (1.803 m)   Wt 154 lb 8 oz (70.1 kg)   SpO2 98%   BMI 21.55 kg/m  Affect appropriate Healthy:  appears stated age 31: normal Neck supple with no adenopathy JVP normal no bruits no thyromegaly Lungs clear with no wheezing and good diaphragmatic motion Heart:  S1/S2 no murmur, no rub, gallop or click PMI normal Abdomen: benighn, BS positve, no tenderness, no AAA no bruit.  No HSM or HJR Distal pulses intact with no bruits No edema Neuro non-focal Skin warm and dry No muscular weakness    Current Outpatient Medications  Medication Sig Dispense Refill  . amLODipine (NORVASC) 5 MG tablet Take 1 tablet by mouth Daily.    . clotrimazole (LOTRIMIN) 1 % cream Apply 1 application topically 2 (two) times daily. 30 g 0  . Doxylamine Succinate, Sleep, (UNISOM PO) Take 1 tablet by mouth at bedtime as needed.    Marland Kitchen oxymetazoline (AFRIN) 0.05 % nasal spray Place 1 spray into both nostrils 2 (two) times daily.    . pantoprazole (PROTONIX) 40 MG tablet Take 40 mg by mouth Daily.     [name] Glycol-Propyl Glycol (SYSTANE OP) Place 1 drop into both eyes daily as needed (dry eyes).    . ranitidine (ZANTAC) 150 MG tablet Take 150 mg by mouth 2 (two) times daily.   0  . terazosin (HYTRIN) 10 MG capsule Take 1 tablet by mouth  Daily.    . urea (CARMOL) 10 % cream Apply topically at bedtime.     No current facility-administered medications for this visit.     Allergies  Procardia [nifedipine]  Electrocardiogram:  05/01/18 SR rate 87 lateral T wave changes   Assessment and Plan  HTN:  Well controlled.  Continue current medications and low sodium Dash type diet.   PVC;s:  Asymptomatic normal EF no history of CAD and active  Abnormal ECG: chronic ? Related to HTN  No change today  Hearing:  Encouraged him to see  an audiologist   Jenkins Rouge

## 2018-05-01 ENCOUNTER — Encounter: Payer: Self-pay | Admitting: Cardiovascular Disease

## 2018-05-01 ENCOUNTER — Ambulatory Visit (INDEPENDENT_AMBULATORY_CARE_PROVIDER_SITE_OTHER): Payer: Medicare Other | Admitting: Cardiovascular Disease

## 2018-05-01 VITALS — BP 128/74 | HR 80 | Ht 71.0 in | Wt 154.5 lb

## 2018-05-01 DIAGNOSIS — I10 Essential (primary) hypertension: Secondary | ICD-10-CM

## 2018-05-01 NOTE — Patient Instructions (Signed)

## 2018-05-06 NOTE — Addendum Note (Signed)
Addended by: Joaquim Lai on: 05/06/2018 08:08 AM   Modules accepted: Orders

## 2018-05-29 DIAGNOSIS — H10413 Chronic giant papillary conjunctivitis, bilateral: Secondary | ICD-10-CM | POA: Diagnosis not present

## 2018-05-29 DIAGNOSIS — H04123 Dry eye syndrome of bilateral lacrimal glands: Secondary | ICD-10-CM | POA: Diagnosis not present

## 2018-05-29 DIAGNOSIS — Z961 Presence of intraocular lens: Secondary | ICD-10-CM | POA: Diagnosis not present

## 2018-05-29 DIAGNOSIS — H401122 Primary open-angle glaucoma, left eye, moderate stage: Secondary | ICD-10-CM | POA: Diagnosis not present

## 2018-05-29 DIAGNOSIS — H04522 Eversion of left lacrimal punctum: Secondary | ICD-10-CM | POA: Diagnosis not present

## 2018-09-03 DIAGNOSIS — Z23 Encounter for immunization: Secondary | ICD-10-CM | POA: Diagnosis not present

## 2018-11-04 ENCOUNTER — Encounter: Payer: Self-pay | Admitting: Physician Assistant

## 2018-11-04 ENCOUNTER — Ambulatory Visit (INDEPENDENT_AMBULATORY_CARE_PROVIDER_SITE_OTHER): Payer: Medicare Other

## 2018-11-04 ENCOUNTER — Ambulatory Visit (INDEPENDENT_AMBULATORY_CARE_PROVIDER_SITE_OTHER): Payer: Medicare Other | Admitting: Physician Assistant

## 2018-11-04 ENCOUNTER — Other Ambulatory Visit: Payer: Self-pay

## 2018-11-04 VITALS — BP 147/75 | HR 74 | Temp 98.8°F | Resp 20 | Ht 70.28 in | Wt 157.4 lb

## 2018-11-04 DIAGNOSIS — J449 Chronic obstructive pulmonary disease, unspecified: Secondary | ICD-10-CM

## 2018-11-04 DIAGNOSIS — R059 Cough, unspecified: Secondary | ICD-10-CM

## 2018-11-04 DIAGNOSIS — R05 Cough: Secondary | ICD-10-CM | POA: Diagnosis not present

## 2018-11-04 DIAGNOSIS — R0981 Nasal congestion: Secondary | ICD-10-CM

## 2018-11-04 DIAGNOSIS — J181 Lobar pneumonia, unspecified organism: Secondary | ICD-10-CM | POA: Diagnosis not present

## 2018-11-04 DIAGNOSIS — R52 Pain, unspecified: Secondary | ICD-10-CM

## 2018-11-04 DIAGNOSIS — J189 Pneumonia, unspecified organism: Secondary | ICD-10-CM

## 2018-11-04 LAB — POC INFLUENZA A&B (BINAX/QUICKVUE)
INFLUENZA A, POC: NEGATIVE
Influenza B, POC: NEGATIVE

## 2018-11-04 MED ORDER — BENZONATATE 100 MG PO CAPS: 100.0000 mg | ORAL_CAPSULE | Freq: Three times a day (TID) | ORAL | 0 refills | Status: DC | PRN

## 2018-11-04 MED ORDER — AZITHROMYCIN 250 MG PO TABS: ORAL_TABLET | ORAL | 0 refills | Status: DC

## 2018-11-04 MED ORDER — IPRATROPIUM BROMIDE 0.03 % NA SOLN: 2.0000 | Freq: Two times a day (BID) | NASAL | 0 refills | Status: DC

## 2018-11-04 MED ORDER — ALBUTEROL SULFATE 108 (90 BASE) MCG/ACT IN AEPB: 2.0000 | INHALATION_SPRAY | RESPIRATORY_TRACT | 0 refills | Status: DC

## 2018-11-04 NOTE — Patient Instructions (Addendum)
Start antibiotic as prescribed.  Use tylenol and ibuprofen for fever.  For symptomatic treatment, start tessalon perlese for cough and atrovent nasal spray for congestion Follow up in 48 hours for reevaluation. If any of your symptoms worsen or you develop new concerning symptoms, please seek care sooner.    If you have lab work done today you will be contacted with your lab results within the next 2 weeks.  If you have not heard from Jared Neal then please contact Jared Neal. The fastest way to get your results is to register for My Chart.  Community-Acquired Pneumonia, Adult Pneumonia is an infection of the lungs. One type of pneumonia can happen while a person is in a hospital. A different type can happen when a person is not in a hospital (community-acquired pneumonia). It is easy for this kind to spread from person to person. It can spread to you if you breathe near an infected person who coughs or sneezes. Some symptoms include:  A dry cough.  A wet (productive) cough.  Fever.  Sweating.  Chest pain.  Follow these instructions at home:  Take over-the-counter and prescription medicines only as told by your doctor. ? Only take cough medicine if you are losing sleep. ? If you were prescribed an antibiotic medicine, take it as told by your doctor. Do not stop taking the antibiotic even if you start to feel better.  Sleep with your head and neck raised (elevated). You can do this by putting a few pillows under your head, or you can sleep in a recliner.  Do not use tobacco products. These include cigarettes, chewing tobacco, and e-cigarettes. If you need help quitting, ask your doctor.  Drink enough water to keep your pee (urine) clear or pale yellow. A shot (vaccine) can help prevent pneumonia. Shots are often suggested for:  People older than 82 years of age.  People older than 82 years of age: ? Who are having cancer treatment. ? Who have long-term (chronic) lung disease. ? Who have  problems with their body's defense system (immune system).  You may also prevent pneumonia if you take these actions:  Get the flu (influenza) shot every year.  Go to the dentist as often as told.  Wash your hands often. If soap and water are not available, use hand sanitizer.  Contact a doctor if:  You have a fever.  You lose sleep because your cough medicine does not help. Get help right away if:  You are short of breath and it gets worse.  You have more chest pain.  Your sickness gets worse. This is very serious if: ? You are an older adult. ? Your body's defense system is weak.  You cough up blood. This information is not intended to replace advice given to you by your health care provider. Make sure you discuss any questions you have with your health care provider. Document Released: 06/04/2008 Document Revised: 05/24/2016 Document Reviewed: 04/13/2015 Elsevier Interactive Patient Education  2018 [employer].   IF you received an x-ray today, you will receive an invoice from [facility]. Please contact [facility] at [phone] with questions or concerns regarding your invoice.   IF you received labwork today, you will receive an invoice from [facility]. Please contact [facility] at [phone] with questions or concerns regarding your invoice.   Our billing staff will not be able to assist you with questions regarding bills from these companies.  You will be contacted with the lab results as soon as they  are available. The fastest way to get your results is to activate your My Chart account. Instructions are located on the last page of this paperwork. If you have not heard from Jared Neal regarding the results in 2 weeks, please contact this office.

## 2018-11-04 NOTE — Progress Notes (Signed)
MRN: [id number] DOB: April 07, 1929  Subjective:   Jared Neal is a 84 y.o. male presenting for chief complaint of Cough (X 6 days- cough/congestion) .  Reports 5-6 day history of dry cough that just turned productive yesterday. Has associated nasal congestion.Has had some low grade fever and body aches. Has tried advil and robitussin with no full relief.  Denies SOB, wheezing, DOE, sinus pain, ear pain, sore throat, difficulty swallowing and chest pain, decreased appetite, weight loss, nausea, vomiting and abdominal pain. Has not had sick contact with anyone.  PMH of COPD,.  Does not have daily symptoms.  No history of seasonal allergies. Has not had flu shot this season.  Former smoker, quit 2000. No recent hosptilizations or abx use.   Jared Neal has a current medication list which includes the following prescription(s): amlodipine, clotrimazole, doxylamine succinate (sleep), oxymetazoline, pantoprazole, polyethyl glycol-propyl glycol, ranitidine, terazosin, and urea. Also is allergic to procardia [nifedipine].  Jared Neal  has a past medical history of Acute, but ill-defined, cerebrovascular disease (04/07/2014), BPH (benign prostatic hyperplasia), COPD (chronic obstructive pulmonary disease) ([place]), Eczema, Erectile dysfunction, GERD (gastroesophageal reflux disease), Heart murmur, HOH (hard of hearing), HTN (hypertension), Meningioma ([place]), Meralgia paraesthetica, Palpitations, and Urinary retention. Also  has a past surgical history that includes Cardiac catheterization; Cataract extraction (Bilateral, 2013); Suprapubic prostatectomy (02/15/2003); Gum surgery (1999); Colonoscopy w/ polypectomy; Laparoscopic cholecystectomy (11/19/2014); Inguinal hernia repair (Left); Meatotomy (06/2003); Bladder diverticulectomy (02/15/2003); Inguinal hernia repair (Right, 11/2009); and Cholecystectomy (N/A, 11/19/2014).   Objective:   Vitals: BP (!) 147/75   Pulse 74   Temp 98.8 F (37.1 C) (Oral)   Resp 20   Ht  5' 10.28" (1.785 m)   Wt 157 lb 6.4 oz (71.4 kg)   SpO2 96%   BMI 22.41 kg/m   Physical Exam  Constitutional: He is oriented to person, place, and time. He appears well-developed and well-nourished. No distress.  HENT:  Head: Normocephalic and atraumatic.  Right Ear: Tympanic membrane, external ear and ear canal normal.  Left Ear: Tympanic membrane, external ear and ear canal normal.  Nose: Mucosal edema present. Right sinus exhibits no maxillary sinus tenderness and no frontal sinus tenderness. Left sinus exhibits no maxillary sinus tenderness and no frontal sinus tenderness.  Mouth/Throat: Uvula is midline and mucous membranes are normal. Posterior oropharyngeal erythema present. No posterior oropharyngeal edema or tonsillar abscesses. No tonsillar exudate.  Eyes: Conjunctivae are normal.  Neck: Normal range of motion.  Cardiovascular: Normal rate, regular rhythm, normal heart sounds and intact distal pulses.  Pulmonary/Chest: Effort normal. No respiratory distress. He has no decreased breath sounds. He has no wheezes. He has no rhonchi. He has rales (LLL ).  Musculoskeletal:       Right lower leg: He exhibits no swelling.       Left lower leg: He exhibits no swelling.  Lymphadenopathy:       Head (right side): No submental, no submandibular, no tonsillar, no preauricular, no posterior auricular and no occipital adenopathy present.       Head (left side): No submental, no submandibular, no tonsillar, no preauricular, no posterior auricular and no occipital adenopathy present.    He has no cervical adenopathy.       Right: No supraclavicular adenopathy present.       Left: No supraclavicular adenopathy present.  Neurological: He is alert and oriented to person, place, and time.  Skin: Skin is warm and dry.  Psychiatric: He has a normal mood and affect.  Vitals reviewed.  Results for orders placed or performed in visit on 11/04/18 (from the past 24 hour(s))  POC Influenza  A&B(BINAX/QUICKVUE)     Status: None   Collection Time: 11/04/18  9:54 AM  Result Value Ref Range   Influenza A, POC Negative Negative   Influenza B, POC Negative Negative    Dg Chest 2 View  Result Date: 11/04/2018 CLINICAL DATA:  84 year old with cough and left lower lobe rales. EXAM: CHEST - 2 VIEW COMPARISON:  11/30/2009 FINDINGS: Again noted is hyperinflation of the lungs with flattening of the hemidiaphragms. A few patchy densities at the left lung base. Otherwise, the lungs are clear. Heart and mediastinum are within normal limits. Trachea is midline. Again noted are multiple ossifications or loose bodies near the left scapular coracoid process. No acute bone abnormalities. Degenerative endplate changes in lower thoracic spine. IMPRESSION: 1. Subtle patchy densities at the left lung base. Findings could represent a small focus of infection. 2. Stable hyperinflation and compatible with COPD. Electronically Signed   By: Markus Daft M.D.   On: 11/04/2018 10:12     Assessment and Plan :  1. Community acquired pneumonia of left lower lobe of lung ([place]) History, physical exam findings, and plain films consistent with pneumonia.  Patient has history of COPD but has never taken inhalers and does not appear to be in exacerbation at this time.  No shortness of breath, wheezing, or dyspnea on exertion.  SPO2 stable 96%.  Flu negative.  Recommend antibiotic and symptomatic management. Follow-up in 2 days for reevaluation.  Given strict return precautions. - azithromycin (ZITHROMAX) 250 MG tablet; Take 2 tabs PO x 1 dose, then 1 tab PO QD x 4 days  Dispense: 6 tablet; Refill: 0 - benzonatate (TESSALON) 100 MG capsule; Take 1-2 capsules (100-200 mg total) by mouth 3 (three) times daily as needed for cough.  Dispense: 40 capsule; Refill: 0  2. Body aches - POC Influenza A&B(BINAX/QUICKVUE)  3. Cough - DG Chest 2 View; Future  4. Chronic obstructive pulmonary disease, unspecified COPD type  ([place]) Given Rx for albuterol inhaler to use if he develops any shortness of breath or wheezing. - Albuterol Sulfate 108 (90 Base) MCG/ACT AEPB; Inhale 2 puffs into the lungs every 4 (four) hours.  Dispense: 1 each; Refill: 0  5. Nasal congestion - ipratropium (ATROVENT) 0.03 % nasal spray; Place 2 sprays into both nostrils 2 (two) times daily.  Dispense: 30 mL; Refill: 0  Side effects, risks, benefits, and alternatives of the medications and treatment plan prescribed today were discussed, and patient expressed understanding of the instructions given. No barriers to understanding were identified. Red flags discussed in detail. Pt expressed understanding regarding what to do in case of emergency/urgent symptoms.  Tenna Delaine, PA-C  [facility] 11/04/2018 10:15 AM

## 2018-11-06 ENCOUNTER — Other Ambulatory Visit: Payer: Self-pay

## 2018-11-06 ENCOUNTER — Ambulatory Visit (INDEPENDENT_AMBULATORY_CARE_PROVIDER_SITE_OTHER): Payer: Medicare Other | Admitting: Emergency Medicine

## 2018-11-06 ENCOUNTER — Encounter: Payer: Self-pay | Admitting: Emergency Medicine

## 2018-11-06 VITALS — BP 159/66 | HR 75 | Temp 98.5°F | Resp 16 | Wt 156.4 lb

## 2018-11-06 DIAGNOSIS — J189 Pneumonia, unspecified organism: Secondary | ICD-10-CM

## 2018-11-06 DIAGNOSIS — R05 Cough: Secondary | ICD-10-CM | POA: Diagnosis not present

## 2018-11-06 DIAGNOSIS — J181 Lobar pneumonia, unspecified organism: Secondary | ICD-10-CM | POA: Diagnosis not present

## 2018-11-06 DIAGNOSIS — R059 Cough, unspecified: Secondary | ICD-10-CM

## 2018-11-06 NOTE — Progress Notes (Signed)
Jared Neal 84 y.o.   Chief Complaint  Patient presents with  . Pneumonia    follow up per patient feels better but still coughing    HISTORY OF PRESENT ILLNESS: This is a 84 y.o. male seen here on 11/04/2018 and found to have early pneumonia in the left lower lobe.  Started on azithromycin.  Doing better.  Still coughing some.  No new symptomatology.  Overall feels a lot better.  Tolerating antibiotic well.  No rash.  Denies nausea vomiting or diarrhea.  HPI   Prior to Admission medications   Medication Sig Start Date End Date Taking? Authorizing Provider  amLODipine (NORVASC) 5 MG tablet Take 1 tablet by mouth Daily. 11/20/12  Yes [provider]  azithromycin (ZITHROMAX) 250 MG tablet Take 2 tabs PO x 1 dose, then 1 tab PO QD x 4 days 11/04/18  Yes Timmothy Euler, [place] D, PA-C  benzonatate (TESSALON) 100 MG capsule Take 1-2 capsules (100-200 mg total) by mouth 3 (three) times daily as needed for cough. 11/04/18  Yes Timmothy Euler, [place] D, PA-C  clotrimazole (LOTRIMIN) 1 % cream Apply 1 application topically 2 (two) times daily. 06/26/14  Yes Wendie Agreste, MD  Doxylamine Succinate, Sleep, (UNISOM PO) Take 1 tablet by mouth at bedtime as needed.   Yes [provider]  ipratropium (ATROVENT) 0.03 % nasal spray Place 2 sprays into both nostrils 2 (two) times daily. 11/04/18  Yes Timmothy Euler, [place] D, PA-C  pantoprazole (PROTONIX) 40 MG tablet Take 40 mg by mouth Daily.  11/20/12  Yes [provider]  Polyethyl Glycol-Propyl Glycol (SYSTANE OP) Place 1 drop into both eyes daily as needed (dry eyes).   Yes [provider]  ranitidine (ZANTAC) 150 MG tablet Take 150 mg by mouth 2 (two) times daily.  10/28/14  Yes [provider]  terazosin (HYTRIN) 10 MG capsule Take 1 tablet by mouth Daily. 11/21/12  Yes [provider]  urea (CARMOL) 10 % cream Apply topically at bedtime.   Yes [provider]  Albuterol Sulfate 108 (90 Base)  MCG/ACT AEPB Inhale 2 puffs into the lungs every 4 (four) hours. Patient not taking: Reported on 11/06/2018 11/04/18   Tenna Delaine D, PA-C  oxymetazoline (AFRIN) 0.05 % nasal spray Place 1 spray into both nostrils 2 (two) times daily.    [provider]    Allergies  Allergen Reactions  . Procardia [Nifedipine] Other (See Comments)    Headaches     Patient Active Problem List   Diagnosis Date Noted  . Symptomatic cholelithiasis 11/19/2014  . Acute, but ill-defined, cerebrovascular disease 04/07/2014  . Murmur 12/16/2013  . Abnormal ECG 12/15/2012  . Palpitations   . HTN (hypertension)   . GERD (gastroesophageal reflux disease)   . Urinary retention   . Eczema   . COPD (chronic obstructive pulmonary disease) ([place])   . Erectile dysfunction   . Meralgia paraesthetica     Past Medical History:  Diagnosis Date  . Acute, but ill-defined, cerebrovascular disease 04/07/2014  . BPH (benign prostatic hyperplasia)   . COPD (chronic obstructive pulmonary disease) ([place])   . Eczema   . Erectile dysfunction   . GERD (gastroesophageal reflux disease)   . Heart murmur   . HOH (hard of hearing)   . HTN (hypertension)   . Meningioma ([facility])    Right frontal  . Meralgia paraesthetica   . Palpitations   . Urinary retention     Past Surgical History:  Procedure Laterality Date  .  BLADDER DIVERTICULECTOMY  02/15/2003   Archie Endo 05/16/2011  . CARDIAC CATHETERIZATION    . CATARACT EXTRACTION Bilateral 2013  . CHOLECYSTECTOMY N/A 11/19/2014   Procedure: LAPAROSCOPIC CHOLECYSTECTOMY WITH INTRAOPERATIVE CHOLANGIOGRAM;  Surgeon: [name], MD;  Location: [facility];  Service: General;  Laterality: N/A;  . COLONOSCOPY W/ POLYPECTOMY    . GUM SURGERY  1999  . INGUINAL HERNIA REPAIR Left   . INGUINAL HERNIA REPAIR Right 11/2009  . LAPAROSCOPIC CHOLECYSTECTOMY  11/19/2014   w/IOC  . MEATOTOMY  06/2003   Distal urethral meatotomy with YAG laser/notes 05/16/2011  . SUPRAPUBIC  PROSTATECTOMY  02/15/2003   Archie Endo 05/16/2011    Social History   Socioeconomic History  . Marital status: Divorced    Spouse name: Not on file  . Number of children: 2  . Years of education: Ph. D  . Highest education level: Not on file  Occupational History  . Occupation: Retired  Scientific laboratory technician  . Financial resource strain: Not on file  . Food insecurity:    Worry: Not on file    Inability: Not on file  . Transportation needs:    Medical: Not on file    Non-medical: Not on file  Tobacco Use  . Smoking status: Former Smoker    Packs/day: 0.20    Years: 40.00    Pack years: 8.00    Types: Cigarettes    Last attempt to quit: 11/12/1999    Years since quitting: 18.9  . Smokeless tobacco: Never Used  Substance and Sexual Activity  . Alcohol use: Yes    Alcohol/week: 14.0 standard drinks    Types: 14 Glasses of wine per week    Comment: occ  . Drug use: No  . Sexual activity: Not Currently  Lifestyle  . Physical activity:    Days per week: Not on file    Minutes per session: Not on file  . Stress: Not on file  Relationships  . Social connections:    Talks on phone: Not on file    Gets together: Not on file    Attends religious service: Not on file    Active member of club or organization: Not on file    Attends meetings of clubs or organizations: Not on file    Relationship status: Not on file  . Intimate partner violence:    Fear of current or ex partner: Not on file    Emotionally abused: Not on file    Physically abused: Not on file    Forced sexual activity: Not on file  Other Topics Concern  . Not on file  Social History Narrative  . Not on file    Family History  Problem Relation Age of Onset  . Alzheimer's disease Mother   . Heart attack Father   . Cancer - Lung Sister      Review of Systems  Constitutional: Negative.  Negative for chills and fever.  HENT: Negative.  Negative for sore throat.   Eyes: Negative.  Negative for discharge and  redness.  Respiratory: Positive for cough. Negative for hemoptysis, sputum production, shortness of breath and wheezing.   Cardiovascular: Negative.  Negative for chest pain and palpitations.  Gastrointestinal: Negative.  Negative for abdominal pain, diarrhea, nausea and vomiting.  Genitourinary: Negative.  Negative for dysuria and hematuria.  Skin: Negative.  Negative for rash.  Neurological: Negative.  Negative for dizziness and headaches.  Endo/Heme/Allergies: Negative.   All other systems reviewed and are negative.   Vitals:   11/06/18  1413  BP: (!) 159/66  Pulse: 75  Resp: 16  Temp: 98.5 F (36.9 C)  SpO2: 97%    Physical Exam  Constitutional: He is oriented to person, place, and time. He appears well-developed and well-nourished.  HENT:  Head: Normocephalic and atraumatic.  Nose: Nose normal.  Mouth/Throat: Oropharynx is clear and moist.  Eyes: Pupils are equal, round, and reactive to light. Conjunctivae and EOM are normal.  Neck: Normal range of motion. Neck supple.  Cardiovascular: Normal rate, regular rhythm and normal heart sounds.  Pulmonary/Chest: Effort normal and breath sounds normal.  Musculoskeletal: Normal range of motion.  Neurological: He is alert and oriented to person, place, and time. No sensory deficit. He exhibits normal muscle tone. Coordination normal.  Skin: Skin is warm and dry. Capillary refill takes less than 2 seconds.  Psychiatric: He has a normal mood and affect. His behavior is normal.  Vitals reviewed.    ASSESSMENT & PLAN: Jared Neal was seen today for pneumonia.  Diagnoses and all orders for this visit:  Community acquired pneumonia of left lower lobe of lung ([place]) Comments: Improving  Cough    Patient Instructions       If you have lab work done today you will be contacted with your lab results within the next 2 weeks.  If you have not heard from [place] then please contact us. The fastest way to get your results is to register  for My Chart.   IF you received an x-ray today, you will receive an invoice from [facility]. Please contact [facility] at [phone] with questions or concerns regarding your invoice.   IF you received labwork today, you will receive an invoice from [facility]. Please contact [facility] at [phone] with questions or concerns regarding your invoice.   Our billing staff will not be able to assist you with questions regarding bills from these companies.  You will be contacted with the lab results as soon as they are available. The fastest way to get your results is to activate your My Chart account. Instructions are located on the last page of this paperwork. If you have not heard from [place] regarding the results in 2 weeks, please contact this office.     Cough, Adult A cough helps to clear your throat and lungs. A cough may last only 2-3 weeks (acute), or it may last longer than 8 weeks (chronic). Many different things can cause a cough. A cough may be a sign of an illness or another medical condition. Follow these instructions at home:  Pay attention to any changes in your cough.  Take medicines only as told by your doctor. ? If you were prescribed an antibiotic medicine, take it as told by your doctor. Do not stop taking it even if you start to feel better. ? Talk with your doctor before you try using a cough medicine.  Drink enough fluid to keep your pee (urine) clear or pale yellow.  If the air is dry, use a cold steam vaporizer or humidifier in your home.  Stay away from things that make you cough at work or at home.  If your cough is worse at night, try using extra pillows to raise your head up higher while you sleep.  Do not smoke, and try not to be around smoke. If you need help quitting, ask your doctor.  Do not have caffeine.  Do not drink alcohol.  Rest as needed. Contact a doctor if:  You have new problems (symptoms).  You cough up yellow fluid  (pus).  Your cough does not get better after 2-3 weeks, or your cough gets worse.  Medicine does not help your cough and you are not sleeping well.  You have pain that gets worse or pain that is not helped with medicine.  You have a fever.  You are losing weight and you do not know why.  You have night sweats. Get help right away if:  You cough up blood.  You have trouble breathing.  Your heartbeat is very fast. This information is not intended to replace advice given to you by your health care provider. Make sure you discuss any questions you have with your health care provider. Document Released: 08/30/2011 Document Revised: 05/24/2016 Document Reviewed: 02/23/2015 Elsevier Interactive Patient Education  2018 Elsevier Inc.      Agustina Caroli, MD [facility]

## 2018-11-06 NOTE — Patient Instructions (Addendum)
     If you have lab work done today you will be contacted with your lab results within the next 2 weeks.  If you have not heard from us then please contact us. The fastest way to get your results is to register for My Chart.   IF you received an x-ray today, you will receive an invoice from [facility]. Please contact [facility] at [phone] with questions or concerns regarding your invoice.   IF you received labwork today, you will receive an invoice from [facility]. Please contact [facility] at [phone] with questions or concerns regarding your invoice.   Our billing staff will not be able to assist you with questions regarding bills from these companies.  You will be contacted with the lab results as soon as they are available. The fastest way to get your results is to activate your My Chart account. Instructions are located on the last page of this paperwork. If you have not heard from us regarding the results in 2 weeks, please contact this office.     Cough, Adult A cough helps to clear your throat and lungs. A cough may last only 2-3 weeks (acute), or it may last longer than 8 weeks (chronic). Many different things can cause a cough. A cough may be a sign of an illness or another medical condition. Follow these instructions at home:  Pay attention to any changes in your cough.  Take medicines only as told by your doctor. ? If you were prescribed an antibiotic medicine, take it as told by your doctor. Do not stop taking it even if you start to feel better. ? Talk with your doctor before you try using a cough medicine.  Drink enough fluid to keep your pee (urine) clear or pale yellow.  If the air is dry, use a cold steam vaporizer or humidifier in your home.  Stay away from things that make you cough at work or at home.  If your cough is worse at night, try using extra pillows to raise your head up higher while you sleep.  Do not smoke, and try not to be  around smoke. If you need help quitting, ask your doctor.  Do not have caffeine.  Do not drink alcohol.  Rest as needed. Contact a doctor if:  You have new problems (symptoms).  You cough up yellow fluid (pus).  Your cough does not get better after 2-3 weeks, or your cough gets worse.  Medicine does not help your cough and you are not sleeping well.  You have pain that gets worse or pain that is not helped with medicine.  You have a fever.  You are losing weight and you do not know why.  You have night sweats. Get help right away if:  You cough up blood.  You have trouble breathing.  Your heartbeat is very fast. This information is not intended to replace advice given to you by your health care provider. Make sure you discuss any questions you have with your health care provider. Document Released: 08/30/2011 Document Revised: 05/24/2016 Document Reviewed: 02/23/2015 Elsevier Interactive Patient Education  2018 Elsevier Inc.  

## 2018-11-11 DIAGNOSIS — H838X3 Other specified diseases of inner ear, bilateral: Secondary | ICD-10-CM | POA: Diagnosis not present

## 2018-11-11 DIAGNOSIS — H903 Sensorineural hearing loss, bilateral: Secondary | ICD-10-CM | POA: Diagnosis not present

## 2018-12-01 DIAGNOSIS — N401 Enlarged prostate with lower urinary tract symptoms: Secondary | ICD-10-CM | POA: Diagnosis not present

## 2018-12-01 DIAGNOSIS — N281 Cyst of kidney, acquired: Secondary | ICD-10-CM | POA: Diagnosis not present

## 2018-12-01 DIAGNOSIS — R3912 Poor urinary stream: Secondary | ICD-10-CM | POA: Diagnosis not present

## 2018-12-03 DIAGNOSIS — H401122 Primary open-angle glaucoma, left eye, moderate stage: Secondary | ICD-10-CM | POA: Diagnosis not present

## 2018-12-03 DIAGNOSIS — H40011 Open angle with borderline findings, low risk, right eye: Secondary | ICD-10-CM | POA: Diagnosis not present

## 2019-05-01 ENCOUNTER — Telehealth: Payer: Self-pay | Admitting: Cardiovascular Disease

## 2019-05-01 NOTE — Telephone Encounter (Signed)
New message     Virtual video visit scheduled for 05-08-19.  Pt gave consent for visit.  YOUR CARDIOLOGY TEAM HAS ARRANGED FOR AN E-VISIT FOR YOUR APPOINTMENT - PLEASE REVIEW IMPORTANT INFORMATION BELOW SEVERAL DAYS PRIOR TO YOUR APPOINTMENT  Due to the recent COVID-19 pandemic, we are transitioning in-person office visits to tele-medicine visits in an effort to decrease unnecessary exposure to our patients, their families, and staff. These visits are billed to your insurance just like a normal visit is. We also encourage you to sign up for MyChart if you have not already done so. You will need a smartphone if possible. For patients that do not have this, we can still complete the visit using a regular telephone but do prefer a smartphone to enable video when possible. You may have a family member that lives with you that can help. If possible, we also ask that you have a blood pressure cuff and scale at home to measure your blood pressure, heart rate and weight prior to your scheduled appointment. Patients with clinical needs that need an in-person evaluation and testing will still be able to come to the office if absolutely necessary. If you have any questions, feel free to call our office.     YOUR PROVIDER WILL BE USING THE FOLLOWING PLATFORM TO COMPLETE YOUR VISIT: Doxy Me   IF USING MYCHART - How to Download the MyChart App to Your SmartPhone   - If Apple, go to [employer] and type in MyChart in the search bar and download the app. If Android, ask patient to go to [employer] and type in [place] in the search bar and download the app. The app is free but as with any other app downloads, your phone may require you to verify saved payment information or Apple/Android password.  - You will need to then log into the app with your MyChart username and password, and select [facility] as your healthcare provider to link the account.  - When it is time for your visit, go to the MyChart app,  find appointments, and click Begin Video Visit. Be sure to Select Allow for your device to access the Microphone and Camera for your visit. You will then be connected, and your provider will be with you shortly.  **If you have any issues connecting or need assistance, please contact MyChart service desk [phone])**  **If using a computer, in order to ensure the best quality for your visit, you will need to use either of the following Internet Browsers: Insurance underwriter or Microsoft Edge**   IF USING DOXIMITY or [email or web address] - The staff will give you instructions on receiving your link to join the meeting the day of your visit.      2-3 DAYS BEFORE YOUR APPOINTMENT  You will receive a telephone call from one of our [facility] team members - your caller ID may say "Unknown caller." If this is a video visit, we will walk you through how to get the video launched on your phone. We will remind you check your blood pressure, heart rate and weight prior to your scheduled appointment. If you have an Apple Watch or Kardia, please upload any pertinent ECG strips the day before or morning of your appointment to [facility]. Our staff will also make sure you have reviewed the consent and agree to move forward with your scheduled tele-health visit.     THE DAY OF YOUR APPOINTMENT  Approximately 15 minutes prior to your scheduled appointment,  you will receive a telephone call from one of [facility] team - your caller ID may say "Unknown caller."  Our staff will confirm medications, vital signs for the day and any symptoms you may be experiencing. Please have this information available prior to the time of visit start. It may also be helpful for you to have a pad of paper and pen handy for any instructions given during your visit. They will also walk you through joining the smartphone meeting if this is a video visit.    CONSENT FOR TELE-HEALTH VISIT - PLEASE REVIEW  I hereby voluntarily request,  consent and authorize [facility] and its employed or contracted physicians, physician assistants, nurse practitioners or other licensed health care professionals (the Practitioner), to provide me with telemedicine health care services (the Services") as deemed necessary by the treating Practitioner. I acknowledge and consent to receive the Services by the Practitioner via telemedicine. I understand that the telemedicine visit will involve communicating with the Practitioner through live audiovisual communication technology and the disclosure of certain medical information by electronic transmission. I acknowledge that I have been given the opportunity to request an in-person assessment or other available alternative prior to the telemedicine visit and am voluntarily participating in the telemedicine visit.  I understand that I have the right to withhold or withdraw my consent to the use of telemedicine in the course of my care at any time, without affecting my right to future care or treatment, and that the Practitioner or I may terminate the telemedicine visit at any time. I understand that I have the right to inspect all information obtained and/or recorded in the course of the telemedicine visit and may receive copies of available information for a reasonable fee.  I understand that some of the potential risks of receiving the Services via telemedicine include:   Delay or interruption in medical evaluation due to technological equipment failure or disruption;  Information transmitted may not be sufficient (e.g. poor resolution of images) to allow for appropriate medical decision making by the Practitioner; and/or   In rare instances, security protocols could fail, causing a breach of personal health information.  Furthermore, I acknowledge that it is my responsibility to provide information about my medical history, conditions and care that is complete and accurate to the best of my ability. I  acknowledge that Practitioner's advice, recommendations, and/or decision may be based on factors not within their control, such as incomplete or inaccurate data provided by me or distortions of diagnostic images or specimens that may result from electronic transmissions. I understand that the practice of medicine is not an exact science and that Practitioner makes no warranties or guarantees regarding treatment outcomes. I acknowledge that I will receive a copy of this consent concurrently upon execution via email to the email address I last provided but may also request a printed copy by calling the office of [facility].    I understand that my insurance will be billed for this visit.   I have read or had this consent read to me.  I understand the contents of this consent, which adequately explains the benefits and risks of the Services being provided via telemedicine.   I have been provided ample opportunity to ask questions regarding this consent and the Services and have had my questions answered to my satisfaction.  I give my informed consent for the services to be provided through the use of telemedicine in my medical care  By participating in this telemedicine visit  I agree to the above.

## 2019-05-05 NOTE — Progress Notes (Signed)
Virtual Visit via Video Note   This visit type was conducted due to national recommendations for restrictions regarding the COVID-19 Pandemic (e.g. social distancing) in an effort to limit this patient's exposure and mitigate transmission in our community.  Due to his co-morbid illnesses, this patient is at least at moderate risk for complications without adequate follow up.  This format is felt to be most appropriate for this patient at this time.  All issues noted in this document were discussed and addressed.  A limited physical exam was performed with this format.  Please refer to the patient's chart for his consent to telehealth for [facility].   Date:  05/08/2019   ID:  Jared Neal, DOB 1929/05/09, MRN [id number]  Patient Location: Home Provider Location: Office  PCP:  [name], MD  Cardiologist:  Johnsie Cancel Electrophysiologist:  None   Evaluation Performed:  Follow-Up Visit  Chief Complaint:  HTN/ Palpitations   History of Present Illness:    85 y.o.  referred by Dr Shelia Media in 2013 . Has had HTN for over 30 years. On two drug Rx. Sometimes spikes with cold meds. Compliant with meds.  History of palpitations.Most recent for a few seconds when tired April 2019 No history of PAF. No history of kidney disease. No other history of PVD or vascular disease. ECG 11/23 with lateral T wave changes Has had bad reaction to procardia in past but not to any other BP meds Holter showing only  PAC;s and PVC;s done 06/2012   Walks 2-4 miles/day on trails Does not notice PVC;s No chest pain or dyspnea   Echo 1/15 Study Conclusions  - Left ventricle: The cavity size was normal. There was mild focal basal and moderate concentric hypertrophy of the septum without evidence of LVOT obstruction. Systolic function was normal. The estimated ejection fraction was in the range of 60% to 65%. Wall motion was normal; there were no regional wall motion abnormalities. Doppler  parameters are consistent with abnormal left ventricular relaxation (grade 1 diastolic dysfunction). Doppler parameters are consistent with high ventricular filling pressure. - Mitral valve: Mild regurgitation. - Left atrium: The atrium was moderately dilated. - Right ventricle: The cavity size was moderately dilated. Wall thickness was normal. Systolic function was normal. - Right atrium: The atrium was mildly dilated. - Atrial septum: No defect or patent foramen ovale was identified. - Tricuspid valve: No regurgitation.   Has one son in [place] pianist married to opera singer with 43 yo grandaughter One daughter in [place] who does life coaching  Seen by primary November 2019 for community acquired pneumonia Rx with zithromax CXR with patchy densities in left lung base  Some knee pain limiting activity   The patient does not have symptoms concerning for COVID-19 infection (fever, chills, cough, or new shortness of breath).    Past Medical History:  Diagnosis Date  . Acute, but ill-defined, cerebrovascular disease 04/07/2014  . BPH (benign prostatic hyperplasia)   . COPD (chronic obstructive pulmonary disease) ([place])   . Eczema   . Erectile dysfunction   . GERD (gastroesophageal reflux disease)   . Heart murmur   . HOH (hard of hearing)   . HTN (hypertension)   . Meningioma ([facility])    Right frontal  . Meralgia paraesthetica   . Palpitations   . Urinary retention    Past Surgical History:  Procedure Laterality Date  . BLADDER DIVERTICULECTOMY  02/15/2003   Archie Endo 05/16/2011  . CARDIAC CATHETERIZATION    . CATARACT EXTRACTION Bilateral  2013  . CHOLECYSTECTOMY N/A 11/19/2014   Procedure: LAPAROSCOPIC CHOLECYSTECTOMY WITH INTRAOPERATIVE CHOLANGIOGRAM;  Surgeon: [name], MD;  Location: [facility];  Service: General;  Laterality: N/A;  . COLONOSCOPY W/ POLYPECTOMY    . GUM SURGERY  1999  . INGUINAL HERNIA REPAIR Left   . INGUINAL HERNIA REPAIR Right 11/2009  .  LAPAROSCOPIC CHOLECYSTECTOMY  11/19/2014   w/IOC  . MEATOTOMY  06/2003   Distal urethral meatotomy with YAG laser/notes 05/16/2011  . SUPRAPUBIC PROSTATECTOMY  02/15/2003   Archie Endo 05/16/2011     Current Meds  Medication Sig  . Albuterol Sulfate 108 (90 Base) MCG/ACT AEPB Inhale 2 puffs into the lungs every 4 (four) hours.  Marland Kitchen amLODipine (NORVASC) 5 MG tablet Take 1 tablet by mouth Daily.  . benzonatate (TESSALON) 100 MG capsule Take 1-2 capsules (100-200 mg total) by mouth 3 (three) times daily as needed for cough.  . clotrimazole (LOTRIMIN) 1 % cream Apply 1 application topically 2 (two) times daily.  . Doxylamine Succinate, Sleep, (UNISOM PO) Take 1 tablet by mouth at bedtime as needed.  Marland Kitchen ipratropium (ATROVENT) 0.03 % nasal spray Place 2 sprays into both nostrils 2 (two) times daily.  Marland Kitchen oxymetazoline (AFRIN) 0.05 % nasal spray Place 1 spray into both nostrils 2 (two) times daily.  . pantoprazole (PROTONIX) 40 MG tablet Take 40 mg by mouth Daily.   [name] Glycol-Propyl Glycol (SYSTANE OP) Place 1 drop into both eyes daily as needed (dry eyes).  . ranitidine (ZANTAC) 150 MG tablet Take 150 mg by mouth 2 (two) times daily.   Marland Kitchen terazosin (HYTRIN) 10 MG capsule Take 1 tablet by mouth Daily.  . urea (CARMOL) 10 % cream Apply topically at bedtime.  . [DISCONTINUED] azithromycin (ZITHROMAX) 250 MG tablet Take 2 tabs PO x 1 dose, then 1 tab PO QD x 4 days     Allergies:   Procardia [nifedipine]   Social History   Tobacco Use  . Smoking status: Former Smoker    Packs/day: 0.20    Years: 40.00    Pack years: 8.00    Types: Cigarettes    Last attempt to quit: 11/12/1999    Years since quitting: 19.4  . Smokeless tobacco: Never Used  Substance Use Topics  . Alcohol use: Yes    Alcohol/week: 14.0 standard drinks    Types: 14 Glasses of wine per week    Comment: occ  . Drug use: No     Family Hx: The patient's family history includes Alzheimer's disease in his mother; Cancer - Lung  in his sister; Heart attack in his father.  ROS:   Please see the history of present illness.     All other systems reviewed and are negative.   Prior CV studies:   The following studies were reviewed today:  Echo 04/13/14   Labs/Other Tests and Data Reviewed:    EKG:  05/01/18 SR lateral T wave changes chronic   Recent Labs: No results found for requested labs within last 8760 hours.   Recent Lipid Panel No results found for: CHOL, TRIG, HDL, CHOLHDL, LDLCALC, LDLDIRECT  Wt Readings from Last 3 Encounters:  05/08/19 70.3 kg  11/06/18 70.9 kg  11/04/18 71.4 kg     Objective:    Vital Signs:  BP 132/78   Pulse (!) 57   Ht 5\' 11"  (1.803 m)   Wt 70.3 kg   BMI 21.62 kg/m    Elderly male No distress Skin warm and dry No JVP elevation  No tachypnea No edema Hard of hearing   ASSESSMENT & PLAN:     HTN:  Well controlled.  Continue current medications and low sodium Dash type diet.   PVC;s:  Asymptomatic normal EF no history of CAD and active  Abnormal ECG: chronic ? Related to HTN  not done today on virtual visit  Hearing:  Encouraged him to see an audiologist  Pneumonia: LLL Rx Zithromax resolved f/u primary encouraged flu shot latter in year   COVID-19 Education: The signs and symptoms of COVID-19 were discussed with the patient and how to seek care for testing (follow up with PCP or arrange E-visit).  The importance of social distancing was discussed today.  Time:   Today, I have spent 30 minutes with the patient with telehealth technology discussing the above problems.     Medication Adjustments/Labs and Tests Ordered: Current medicines are reviewed at length with the patient today.  Concerns regarding medicines are outlined above.   Tests Ordered: No orders of the defined types were placed in this encounter.   Medication Changes: No orders of the defined types were placed in this encounter.   Disposition:  Follow up in a year  Signed, Jenkins Rouge, MD  05/08/2019 8:36 AM    [place]

## 2019-05-08 ENCOUNTER — Telehealth (INDEPENDENT_AMBULATORY_CARE_PROVIDER_SITE_OTHER): Payer: Medicare Other | Admitting: Cardiovascular Disease

## 2019-05-08 ENCOUNTER — Other Ambulatory Visit: Payer: Self-pay

## 2019-05-08 VITALS — BP 132/78 | HR 57 | Ht 71.0 in | Wt 155.0 lb

## 2019-05-08 DIAGNOSIS — I10 Essential (primary) hypertension: Secondary | ICD-10-CM | POA: Diagnosis not present

## 2019-05-08 DIAGNOSIS — R9431 Abnormal electrocardiogram [ECG] [EKG]: Secondary | ICD-10-CM

## 2019-05-08 NOTE — Patient Instructions (Addendum)

## 2019-05-11 DIAGNOSIS — Z125 Encounter for screening for malignant neoplasm of prostate: Secondary | ICD-10-CM | POA: Diagnosis not present

## 2019-05-11 DIAGNOSIS — I10 Essential (primary) hypertension: Secondary | ICD-10-CM | POA: Diagnosis not present

## 2019-06-02 DIAGNOSIS — Z8673 Personal history of transient ischemic attack (TIA), and cerebral infarction without residual deficits: Secondary | ICD-10-CM | POA: Diagnosis not present

## 2019-06-02 DIAGNOSIS — R002 Palpitations: Secondary | ICD-10-CM | POA: Diagnosis not present

## 2019-06-02 DIAGNOSIS — R9431 Abnormal electrocardiogram [ECG] [EKG]: Secondary | ICD-10-CM | POA: Diagnosis not present

## 2019-06-02 DIAGNOSIS — J449 Chronic obstructive pulmonary disease, unspecified: Secondary | ICD-10-CM | POA: Diagnosis not present

## 2019-06-02 DIAGNOSIS — I6523 Occlusion and stenosis of bilateral carotid arteries: Secondary | ICD-10-CM | POA: Diagnosis not present

## 2019-06-02 DIAGNOSIS — N401 Enlarged prostate with lower urinary tract symptoms: Secondary | ICD-10-CM | POA: Diagnosis not present

## 2019-06-02 DIAGNOSIS — I10 Essential (primary) hypertension: Secondary | ICD-10-CM | POA: Diagnosis not present

## 2019-06-02 DIAGNOSIS — K219 Gastro-esophageal reflux disease without esophagitis: Secondary | ICD-10-CM | POA: Diagnosis not present

## 2019-09-03 DIAGNOSIS — Z23 Encounter for immunization: Secondary | ICD-10-CM | POA: Diagnosis not present

## 2019-12-04 DIAGNOSIS — H903 Sensorineural hearing loss, bilateral: Secondary | ICD-10-CM | POA: Diagnosis not present

## 2019-12-04 DIAGNOSIS — H838X3 Other specified diseases of inner ear, bilateral: Secondary | ICD-10-CM | POA: Diagnosis not present

## 2020-01-11 ENCOUNTER — Encounter: Payer: Self-pay | Admitting: Nurse Practitioner

## 2020-01-11 ENCOUNTER — Other Ambulatory Visit: Payer: Self-pay

## 2020-01-11 ENCOUNTER — Ambulatory Visit (INDEPENDENT_AMBULATORY_CARE_PROVIDER_SITE_OTHER): Payer: Medicare Other | Admitting: Nurse Practitioner

## 2020-01-11 VITALS — BP 140/70 | HR 76 | Ht 71.0 in | Wt 159.0 lb

## 2020-01-11 DIAGNOSIS — I4819 Other persistent atrial fibrillation: Secondary | ICD-10-CM | POA: Diagnosis not present

## 2020-01-11 DIAGNOSIS — Z7189 Other specified counseling: Secondary | ICD-10-CM | POA: Diagnosis not present

## 2020-01-11 DIAGNOSIS — I10 Essential (primary) hypertension: Secondary | ICD-10-CM

## 2020-01-11 DIAGNOSIS — R9431 Abnormal electrocardiogram [ECG] [EKG]: Secondary | ICD-10-CM | POA: Diagnosis not present

## 2020-01-11 MED ORDER — APIXABAN 5 MG PO TABS: 5.0000 mg | ORAL_TABLET | Freq: Two times a day (BID) | ORAL | 6 refills | Status: DC

## 2020-01-11 NOTE — Patient Instructions (Addendum)
After Visit Summary:  We will be checking the following labs today - BMET, CBC, TSH   Medication Instructions:    Continue with your current medicines.BUT  I am adding Eliquis 5 mg to take twice a day - this is to protect you from having a stroke. I have sent this to your pharmacy and we will try to give you samples.    If you need a refill on your cardiac medications before your next appointment, please call your pharmacy.     Testing/Procedures To Be Arranged:  N/A  Follow-Up:   See me in a month - repeat EKG and labs  Dr. Johnsie Cancel in May     At [facility], you and your health needs are our priority.  As part of our continuing mission to provide you with exceptional heart care, we have created designated Provider Care Teams.  These Care Teams include your primary Cardiologist (physician) and Advanced Practice Providers (APPs -  Physician Assistants and Nurse Practitioners) who all work together to provide you with the care you need, when you need it.  Special Instructions:  . Stay safe, stay home, wash your hands for at least 20 seconds and wear a mask when out in public.  . It was good to talk with you today.  If you are interested in the COVID 19 vaccine - call the [facility] Department at [phone] and select Option #2. Appointments can be arranged at various locations. No walk-ins will be accepted.   We are recommending the COVID 19 vaccine to ALL of our patients. Cardiac medications (including blood thinners) should not deter anyone from being vaccinated. There is no need to hold any of these medicines prior to vaccine administration.   . Visit [email or web address] and clinic on the "COVID 19 Vaccine Info" for more information.  .    Call the [place] office at [phone] if you have any questions, problems or concerns.

## 2020-01-11 NOTE — Progress Notes (Signed)
CARDIOLOGY OFFICE NOTE  Date:  01/11/2020    Jared Neal Date of Birth: [birth year] Medical Record [id number]  PCP:  [name], MD  Cardiologist:  Johnsie Cancel  Chief Complaint  Patient presents with  . Follow-up    Work in visit - seen for Dr. Johnsie Cancel    History of Present Illness: Jared Neal is a 86 y.o. male who presents today for a work in visit. Seen for Dr. Johnsie Cancel.   He has had a history of long standing HTN - noted prior spikes with cold medicine use. Other issues include COPD, GERD, palpitations, no prior PAF, and chronically abnormal EKG. Did not tolerate Procardia in the past. Past Holter with only PACs and PVCs from 2013. Remote echo from 2015.   He was seen in person here in May of 2019 and was in NSR. Last seen by a telehealth visit back in May - cardiac status felt to be ok. Had had prior pneumonia. Compliant with medicines.   The patient does not have symptoms concerning for COVID-19 infection (fever, chills, cough, or new shortness of breath).   Comes in today. Here alone. He says he is here for his BP. Says readings are 130-140/85-90 now at home. Feels fine. Walking 2 miles a day. No chest pain. Breathing is good. Not dizzy or lightheaded. No falls. No palpitations.   Past Medical History:  Diagnosis Date  . Acute, but ill-defined, cerebrovascular disease 04/07/2014  . BPH (benign prostatic hyperplasia)   . COPD (chronic obstructive pulmonary disease) ([place])   . Eczema   . Erectile dysfunction   . GERD (gastroesophageal reflux disease)   . Heart murmur   . HOH (hard of hearing)   . HTN (hypertension)   . Meningioma ([facility])    Right frontal  . Meralgia paraesthetica   . Palpitations   . Urinary retention     Past Surgical History:  Procedure Laterality Date  . BLADDER DIVERTICULECTOMY  02/15/2003   Archie Endo 05/16/2011  . CARDIAC CATHETERIZATION    . CATARACT EXTRACTION Bilateral 2013  . CHOLECYSTECTOMY N/A 11/19/2014   Procedure: LAPAROSCOPIC  CHOLECYSTECTOMY WITH INTRAOPERATIVE CHOLANGIOGRAM;  Surgeon: [name], MD;  Location: [facility];  Service: General;  Laterality: N/A;  . COLONOSCOPY W/ POLYPECTOMY    . GUM SURGERY  1999  . INGUINAL HERNIA REPAIR Left   . INGUINAL HERNIA REPAIR Right 11/2009  . LAPAROSCOPIC CHOLECYSTECTOMY  11/19/2014   w/IOC  . MEATOTOMY  06/2003   Distal urethral meatotomy with YAG laser/notes 05/16/2011  . SUPRAPUBIC PROSTATECTOMY  02/15/2003   Archie Endo 05/16/2011     Medications: Current Meds  Medication Sig  . amLODipine (NORVASC) 5 MG tablet Take 1 tablet by mouth Daily.  Marland Kitchen oxymetazoline (AFRIN) 0.05 % nasal spray Place 1 spray into both nostrils 2 (two) times daily.  . pantoprazole (PROTONIX) 40 MG tablet Take 40 mg by mouth Daily.   [name] Glycol-Propyl Glycol (SYSTANE OP) Place 1 drop into both eyes daily as needed (dry eyes).  . terazosin (HYTRIN) 10 MG capsule Take 1 tablet by mouth Daily.     Allergies: Allergies  Allergen Reactions  . Procardia [Nifedipine] Other (See Comments)    Headaches     Social History: The patient  reports that he quit smoking about 20 years ago. His smoking use included cigarettes. He has a 8.00 pack-year smoking history. He has never used smokeless tobacco. He reports current alcohol use of about 14.0 standard drinks of alcohol per week. He reports  that he does not use drugs.   Family History: The patient's family history includes Alzheimer's disease in his mother; Cancer - Lung in his sister; Heart attack in his father.   Review of Systems: Please see the history of present illness.   All other systems are reviewed and negative.   Physical Exam: VS:  BP 140/70   Pulse 76   Ht 5\' 11"  (1.803 m)   Wt 159 lb (72.1 kg)   SpO2 97%   BMI 22.18 kg/m  .  BMI Body mass index is 22.18 kg/m.  Wt Readings from Last 3 Encounters:  01/11/20 159 lb (72.1 kg)  05/08/19 155 lb (70.3 kg)  11/06/18 156 lb 6.4 oz (70.9 kg)    General: Pleasant. Looks  younger than his stated age. Alert and in no acute distress.   HEENT: Normal.  Neck: Supple, no JVD, carotid bruits, or masses noted.  Cardiac: Irregular irregular rhythm. Rate is ok. No edema.  Respiratory:  Lungs are clear to auscultation bilaterally with normal work of breathing.  GI: Soft and nontender.  MS: No deformity or atrophy. Gait and ROM intact.  Skin: Warm and dry. Color is normal.  Neuro:  Strength and sensation are intact and no gross focal deficits noted.  Psych: Alert, appropriate and with normal affect.   LABORATORY DATA:  EKG:  EKG is ordered today. This demonstrates AF with controlled VR of 71 - diffuse lateral changes. The AF appears new. Lateral changes are chronic - EKG is reviewed with Dr. Rayann Heman (DOD).   Lab Results  Component Value Date   WBC 7.4 11/11/2014   HGB 15.6 11/11/2014   HCT 46.3 11/11/2014   PLT 157 11/11/2014   GLUCOSE 114 (H) 11/11/2014   ALT 18 11/11/2014   AST 26 11/11/2014   NA 138 11/11/2014   K 5.0 11/11/2014   CL 102 11/11/2014   CREATININE 1.03 11/11/2014   BUN 29 (H) 11/11/2014   CO2 24 11/11/2014   INR 1.14 11/11/2014       BNP (last 3 results) No results for input(s): BNP in the last 8760 hours.  ProBNP (last 3 results) No results for input(s): PROBNP in the last 8760 hours.   Other Studies Reviewed Today:  Echo 1/15 Study Conclusions  - Left ventricle: The cavity size was normal. There was mild focal basal and moderate concentric hypertrophy of the septum without evidence of LVOT obstruction. Systolic function was normal. The estimated ejection fraction was in the range of 60% to 65%. Wall motion was normal; there were no regional wall motion abnormalities. Doppler parameters are consistent with abnormal left ventricular relaxation (grade 1 diastolic dysfunction). Doppler parameters are consistent with high ventricular filling pressure. - Mitral valve: Mild regurgitation. - Left atrium: The  atrium was moderately dilated. - Right ventricle: The cavity size was moderately dilated. Wall thickness was normal. Systolic function was normal. - Right atrium: The atrium was mildly dilated. - Atrial septum: No defect or patent foramen ovale was identified. - Tricuspid valve: No regurgitation.    Assessment/Plan:  1. HTN - I think his BP is acceptable at this time.   2. Atrial fib - he tells me during my exam - "Oh, I have that AFib that comes and goes" - he thinks this has happened since his last visit with Dr. Johnsie Cancel - he is not sure - he is not on anticoagulation. I do not see any contraindication to anticoagulation - his rate is fine. Discussed with Dr. Rayann Heman (  DOD) - we would recommend Eliquis 5 mg BID - baseline labs today - lab on return with EKG as well. He is not symptomatic - would favor current strategy. Had moderate LAE on echo from 2015 - making restoration back to NSR challenging.   3. Chronic PACs/PVCs on remote Holter.   4. Chronically abnormal EKG with lateral T wave changes - no active chest pain - he is quite active.   5. Advanced age - does very well - walking 2 miles a day.   6. COVID-19 Education: The signs and symptoms of COVID-19 were discussed with the patient and how to seek care for testing (follow up with PCP or arrange E-visit).  The importance of social distancing, staying at home, hand hygiene and wearing a mask when out in public were discussed today.  Current medicines are reviewed with the patient today.  The patient does not have concerns regarding medicines other than what has been noted above.  The following changes have been made:  See above.  Labs/ tests ordered today include:    Orders Placed This Encounter  Procedures  . Basic metabolic panel  . CBC no Diff  . TSH  . EKG 12-Lead     Disposition:   FU with me in about a month - repeat EKG and lab on return.   Patient is agreeable to this plan and will call if any problems  develop in the interim.   SignedTruitt Merle, NP  01/11/2020 4:06 PM  [facility] [address] [place], [place]  [address] Phone: [phone] Fax: [phone]

## 2020-01-12 ENCOUNTER — Telehealth: Payer: Self-pay | Admitting: Nurse Practitioner

## 2020-01-12 LAB — CBC
Hematocrit: 46.5 % (ref 37.5–51.0)
Hemoglobin: 15.8 g/dL (ref 13.0–17.7)
MCH: 28.7 pg (ref 26.6–33.0)
MCHC: 34 g/dL (ref 31.5–35.7)
MCV: 85 fL (ref 79–97)
Platelets: 178 10*3/uL (ref 150–450)
RBC: 5.5 x10E6/uL (ref 4.14–5.80)
RDW: 12.5 % (ref 11.6–15.4)
WBC: 7.5 10*3/uL (ref 3.4–10.8)

## 2020-01-12 LAB — BASIC METABOLIC PANEL
BUN/Creatinine Ratio: 27 — ABNORMAL HIGH (ref 10–24)
BUN: 31 mg/dL — ABNORMAL HIGH (ref 8–27)
CO2: 24 mmol/L (ref 20–29)
Calcium: 8.8 mg/dL (ref 8.6–10.2)
Chloride: 105 mmol/L (ref 96–106)
Creatinine, Ser: 1.13 mg/dL (ref 0.76–1.27)
GFR calc Af Amer: 67 mL/min/{1.73_m2} (ref >59)
GFR calc non Af Amer: 58 mL/min/{1.73_m2} — ABNORMAL LOW (ref >59)
Glucose: 105 mg/dL — ABNORMAL HIGH (ref 65–99)
Potassium: 4.3 mmol/L (ref 3.5–5.2)
Sodium: 141 mmol/L (ref 134–144)

## 2020-01-12 LAB — TSH: TSH: 2.27 u[IU]/mL (ref 0.450–4.500)

## 2020-01-12 NOTE — Telephone Encounter (Signed)
Pt returning a call from [facility] to discuss lab results. Please call back.

## 2020-01-19 DIAGNOSIS — R3912 Poor urinary stream: Secondary | ICD-10-CM | POA: Diagnosis not present

## 2020-01-19 DIAGNOSIS — N281 Cyst of kidney, acquired: Secondary | ICD-10-CM | POA: Diagnosis not present

## 2020-01-19 DIAGNOSIS — N401 Enlarged prostate with lower urinary tract symptoms: Secondary | ICD-10-CM | POA: Diagnosis not present

## 2020-01-31 ENCOUNTER — Ambulatory Visit: Payer: Medicare Other

## 2020-02-06 ENCOUNTER — Ambulatory Visit: Payer: Medicare Other

## 2020-02-09 DIAGNOSIS — L309 Dermatitis, unspecified: Secondary | ICD-10-CM | POA: Diagnosis not present

## 2020-02-09 DIAGNOSIS — D1801 Hemangioma of skin and subcutaneous tissue: Secondary | ICD-10-CM | POA: Diagnosis not present

## 2020-02-11 ENCOUNTER — Ambulatory Visit: Payer: Medicare Other

## 2020-02-11 NOTE — Progress Notes (Signed)
CARDIOLOGY OFFICE NOTE  Date:  02/15/2020    Jared Neal Date of Birth: [birth year] Medical Record [id number]  PCP:  [name], MD  Cardiologist:  [name]  Chief Complaint  Patient presents with  . Follow-up    Seen for Dr. Johnsie Cancel    History of Present Illness: Jared Neal is a 86 y.o. male who presents today for a 1 month check. Seen for Dr. Johnsie Cancel.   He has had a history of long standing HTN - noted prior spikes with cold medicine use. Other issues include COPD, GERD, palpitations, no prior PAF, and chronically abnormal EKG. Did not tolerate Procardia in the past. Past Holter with only PACs and PVCs from 2013. Remote echo from 2015.   He was seen in person here in May of 2019 by Dr. Johnsie Cancel and was in NSR. He had a telehealth visit back in May with Dr. Johnsie Cancel - cardiac status felt to be ok. Had had prior pneumonia. Compliant with medicines. I then saw him as a work in last month - he was concerned about his BP. But I found him to be in AF - he did not seem surprised by this - we elected to start him on [facility] therapy.     The patient does not have symptoms concerning for COVID-19 infection (fever, chills, cough, or new shortness of breath).   Comes in today. Here alone. He is doing ok. Tolerating his Eliquis without issue. No bleeding/bruising. He notes some atrial fib "off and on". BP typically ok at home. He feels like he is doing well. Still walking his 2 miles a day.   Past Medical History:  Diagnosis Date  . Acute, but ill-defined, cerebrovascular disease 04/07/2014  . BPH (benign prostatic hyperplasia)   . COPD (chronic obstructive pulmonary disease) ([place])   . Eczema   . Erectile dysfunction   . GERD (gastroesophageal reflux disease)   . Heart murmur   . HOH (hard of hearing)   . HTN (hypertension)   . Meningioma ([facility])    Right frontal  . Meralgia paraesthetica   . Palpitations   . Urinary retention     Past Surgical History:  Procedure  Laterality Date  . BLADDER DIVERTICULECTOMY  02/15/2003   Archie Endo 05/16/2011  . CARDIAC CATHETERIZATION    . CATARACT EXTRACTION Bilateral 2013  . CHOLECYSTECTOMY N/A 11/19/2014   Procedure: LAPAROSCOPIC CHOLECYSTECTOMY WITH INTRAOPERATIVE CHOLANGIOGRAM;  Surgeon: [name], MD;  Location: [facility];  Service: General;  Laterality: N/A;  . COLONOSCOPY W/ POLYPECTOMY    . GUM SURGERY  1999  . INGUINAL HERNIA REPAIR Left   . INGUINAL HERNIA REPAIR Right 11/2009  . LAPAROSCOPIC CHOLECYSTECTOMY  11/19/2014   w/IOC  . MEATOTOMY  06/2003   Distal urethral meatotomy with YAG laser/notes 05/16/2011  . SUPRAPUBIC PROSTATECTOMY  02/15/2003   Archie Endo 05/16/2011     Medications: Current Meds  Medication Sig  . amLODipine (NORVASC) 5 MG tablet Take 1 tablet by mouth Daily.  Marland Kitchen apixaban (ELIQUIS) 5 MG TABS tablet Take 1 tablet (5 mg total) by mouth 2 (two) times daily.  . ciclopirox (LOPROX) 0.77 % cream Apply 1 application topically daily.  Marland Kitchen latanoprost (XALATAN) 0.005 % ophthalmic solution Place 1 drop into both eyes at bedtime.  . pantoprazole (PROTONIX) 40 MG tablet Take 40 mg by mouth Daily.   [name] Glycol-Propyl Glycol (SYSTANE OP) Place 1 drop into both eyes daily as needed (dry eyes).  . terazosin (HYTRIN) 10 MG  capsule Take 1 tablet by mouth Daily.  Marland Kitchen triamcinolone cream (KENALOG) 0.1 % Apply 1 application topically as needed.     Allergies: Allergies  Allergen Reactions  . Procardia [Nifedipine] Other (See Comments)    Headaches     Social History: The patient  reports that he quit smoking about 20 years ago. His smoking use included cigarettes. He has a 8.00 pack-year smoking history. He has never used smokeless tobacco. He reports current alcohol use of about 14.0 standard drinks of alcohol per week. He reports that he does not use drugs.   Family History: The patient's family history includes Alzheimer's disease in his mother; Cancer - Lung in his sister; Heart attack in  his father.   Review of Systems: Please see the history of present illness.   All other systems are reviewed and negative.   Physical Exam: VS:  BP (!) 154/86   Pulse 79   Ht 5\' 11"  (1.803 m)   Wt 163 lb (73.9 kg)   SpO2 99%   BMI 22.73 kg/m  .  BMI Body mass index is 22.73 kg/m.  Wt Readings from Last 3 Encounters:  02/15/20 163 lb (73.9 kg)  01/11/20 159 lb (72.1 kg)  05/08/19 155 lb (70.3 kg)   BP is 130/80 by me.   General: Pleasant. He is in no acute distress. He looks younger than his stated age.  HEENT: Normal.  Neck: Supple, no JVD, carotid bruits, or masses noted.  Cardiac: Irregular irregular rhythm. His rate is fine. No edema.  Respiratory:  Lungs are clear to auscultation bilaterally with normal work of breathing.  MS: No deformity or atrophy. Gait and ROM intact.  Skin: Warm and dry. Color is normal.  Neuro:  Strength and sensation are intact and no gross focal deficits noted.  Psych: Alert, appropriate and with normal affect.   LABORATORY DATA:  EKG:  EKG is ordered today. This demonstrates persistent AF - his rate is controlled.   Lab Results  Component Value Date   WBC 7.5 01/11/2020   HGB 15.8 01/11/2020   HCT 46.5 01/11/2020   PLT 178 01/11/2020   GLUCOSE 105 (H) 01/11/2020   ALT 18 11/11/2014   AST 26 11/11/2014   NA 141 01/11/2020   K 4.3 01/11/2020   CL 105 01/11/2020   CREATININE 1.13 01/11/2020   BUN 31 (H) 01/11/2020   CO2 24 01/11/2020   TSH 2.270 01/11/2020   INR 1.14 11/11/2014         BNP (last 3 results) No results for input(s): BNP in the last 8760 hours.  ProBNP (last 3 results) No results for input(s): PROBNP in the last 8760 hours.   Other Studies Reviewed Today:  Echo 1/15 Study Conclusions  - Left ventricle: The cavity size was normal. There was mild focal basal and moderate concentric hypertrophy of the septum without evidence of LVOT obstruction. Systolic function was normal. The estimated ejection  fraction was in the range of 60% to 65%. Wall motion was normal; there were no regional wall motion abnormalities. Doppler parameters are consistent with abnormal left ventricular relaxation (grade 1 diastolic dysfunction). Doppler parameters are consistent with high ventricular filling pressure. - Mitral valve: Mild regurgitation. - Left atrium: The atrium was moderately dilated. - Right ventricle: The cavity size was moderately dilated. Wall thickness was normal. Systolic function was normal. - Right atrium: The atrium was mildly dilated. - Atrial septum: No defect or patent foramen ovale was identified. - Tricuspid valve: No  regurgitation.    Assessment/Plan:  1. Persistent AF - now on anticoagulation with Eliquis - his rate is well controlled. [place] lab today. We have favored conservative management (discussed previously with Dr. Rayann Heman) - he is not symptomatic. Had moderate LAE noted on prior echo making restoration more challenging.   2. HTN - BP is fine - recheck by me is fine - I have left him on his current regimen.   3. History of PACs/PVCs on prior monitor.   4. Chronically abnormal EKG with lateral T wave changes - no active symptoms. Remains very active with good/stable exercise tolerance.    5. Advanced age - does very well - walking 2 miles a day.  6. COVID-19 Education: The signs and symptoms of COVID-19 were discussed with the patient and how to seek care for testing (follow up with PCP or arrange E-visit).  The importance of social distancing, staying at home, hand hygiene and wearing a mask when out in public were discussed today.  Current medicines are reviewed with the patient today.  The patient does not have concerns regarding medicines other than what has been noted above.  The following changes have been made:  See above.  Labs/ tests ordered today include:    Orders Placed This Encounter  Procedures  . Basic metabolic panel  .  CBC     Disposition:   FU with Dr. Johnsie Cancel in May per recall.  Recheck surveillance lab today.   Patient is agreeable to this plan and will call if any problems develop in the interim.   SignedTruitt Merle, NP  02/15/2020 12:31 PM  [facility] [address] [place],   [address] Phone: [phone] Fax: [phone]

## 2020-02-15 ENCOUNTER — Encounter: Payer: Self-pay | Admitting: Nurse Practitioner

## 2020-02-15 ENCOUNTER — Other Ambulatory Visit: Payer: Self-pay

## 2020-02-15 ENCOUNTER — Ambulatory Visit (INDEPENDENT_AMBULATORY_CARE_PROVIDER_SITE_OTHER): Payer: Medicare Other | Admitting: Nurse Practitioner

## 2020-02-15 VITALS — BP 154/86 | HR 79 | Ht 71.0 in | Wt 163.0 lb

## 2020-02-15 DIAGNOSIS — Z7189 Other specified counseling: Secondary | ICD-10-CM

## 2020-02-15 DIAGNOSIS — I10 Essential (primary) hypertension: Secondary | ICD-10-CM

## 2020-02-15 DIAGNOSIS — Z79899 Other long term (current) drug therapy: Secondary | ICD-10-CM

## 2020-02-15 DIAGNOSIS — I4819 Other persistent atrial fibrillation: Secondary | ICD-10-CM | POA: Diagnosis not present

## 2020-02-15 NOTE — Patient Instructions (Addendum)
After Visit Summary:  We will be checking the following labs today - BMET & CBC   Medication Instructions:    Continue with your current medicines.    If you need a refill on your cardiac medications before your next appointment, please call your pharmacy.     Testing/Procedures To Be Arranged:  N/A  Follow-Up:   See Dr. Johnsie Cancel in May per recall.     At [facility], you and your health needs are our priority.  As part of our continuing mission to provide you with exceptional heart care, we have created designated Provider Care Teams.  These Care Teams include your primary Cardiologist (physician) and Advanced Practice Providers (APPs -  Physician Assistants and Nurse Practitioners) who all work together to provide you with the care you need, when you need it.  Special Instructions:  . Stay safe, stay home, wash your hands for at least 20 seconds and wear a mask when out in public.  . It was good to talk with you today.    Call the [place] office at [phone] if you have any questions, problems or concerns.

## 2020-02-16 LAB — BASIC METABOLIC PANEL
BUN/Creatinine Ratio: 30 — ABNORMAL HIGH (ref 10–24)
BUN: 35 mg/dL — ABNORMAL HIGH (ref 8–27)
CO2: 23 mmol/L (ref 20–29)
Calcium: 8.9 mg/dL (ref 8.6–10.2)
Chloride: 104 mmol/L (ref 96–106)
Creatinine, Ser: 1.17 mg/dL (ref 0.76–1.27)
GFR calc Af Amer: 64 mL/min/{1.73_m2} (ref >59)
GFR calc non Af Amer: 55 mL/min/{1.73_m2} — ABNORMAL LOW (ref >59)
Glucose: 83 mg/dL (ref 65–99)
Potassium: 4.1 mmol/L (ref 3.5–5.2)
Sodium: 141 mmol/L (ref 134–144)

## 2020-02-16 LAB — CBC
Hematocrit: 44.6 % (ref 37.5–51.0)
Hemoglobin: 15.2 g/dL (ref 13.0–17.7)
MCH: 28.7 pg (ref 26.6–33.0)
MCHC: 34.1 g/dL (ref 31.5–35.7)
MCV: 84 fL (ref 79–97)
Platelets: 181 10*3/uL (ref 150–450)
RBC: 5.29 x10E6/uL (ref 4.14–5.80)
RDW: 12.7 % (ref 11.6–15.4)
WBC: 6.6 10*3/uL (ref 3.4–10.8)

## 2020-03-28 DIAGNOSIS — R350 Frequency of micturition: Secondary | ICD-10-CM | POA: Diagnosis not present

## 2020-04-04 DIAGNOSIS — R600 Localized edema: Secondary | ICD-10-CM | POA: Diagnosis not present

## 2020-05-11 NOTE — H&P (View-Only) (Signed)
CARDIOLOGY OFFICE NOTE  Date:  05/20/2020    Jared Neal Date of Birth: [birth year] Medical Record [id number]  PCP:  [name], MD  Cardiologist:  [name]  No chief complaint on file.   History of Present Illness: Jared Neal is a 86 y.o. male who presents today for f/u afib  He has had a history of long standing HTN Other issues include COPD, GERD, , and chronically abnormal EKG. Did not tolerate Procardia in the past. Past Holter with only PACs and PVCs from 2013. Remote echo from 2015.   Seen by NP 01/11/20   he was concerned about his BP. But  found him to be in AF He was started on eliquis .     The patient does not have symptoms concerning for COVID-19 infection (fever, chills, cough, or new shortness of breath).   He has had mildly elevated HR. Mild dyspnea His hearing is poor and aids not working  Long discussion about diagnosis of afib and goals of Rx including rate control anticoagulation and conversion  Favor one attempt at conversion   Past Medical History:  Diagnosis Date  . Acute, but ill-defined, cerebrovascular disease 04/07/2014  . BPH (benign prostatic hyperplasia)   . COPD (chronic obstructive pulmonary disease) ([place])   . Eczema   . Erectile dysfunction   . GERD (gastroesophageal reflux disease)   . Heart murmur   . HOH (hard of hearing)   . HTN (hypertension)   . Meningioma ([facility])    Right frontal  . Meralgia paraesthetica   . Palpitations   . Urinary retention     Past Surgical History:  Procedure Laterality Date  . BLADDER DIVERTICULECTOMY  02/15/2003   Archie Endo 05/16/2011  . CARDIAC CATHETERIZATION    . CATARACT EXTRACTION Bilateral 2013  . CHOLECYSTECTOMY N/A 11/19/2014   Procedure: LAPAROSCOPIC CHOLECYSTECTOMY WITH INTRAOPERATIVE CHOLANGIOGRAM;  Surgeon: [name], MD;  Location: [facility];  Service: General;  Laterality: N/A;  . COLONOSCOPY W/ POLYPECTOMY    . GUM SURGERY  1999  . INGUINAL HERNIA REPAIR Left   .  INGUINAL HERNIA REPAIR Right 11/2009  . LAPAROSCOPIC CHOLECYSTECTOMY  11/19/2014   w/IOC  . MEATOTOMY  06/2003   Distal urethral meatotomy with YAG laser/notes 05/16/2011  . SUPRAPUBIC PROSTATECTOMY  02/15/2003   Archie Endo 05/16/2011     Medications: Current Meds  Medication Sig  . amLODipine (NORVASC) 5 MG tablet Take 1 tablet by mouth Daily.  Marland Kitchen apixaban (ELIQUIS) 5 MG TABS tablet Take 1 tablet (5 mg total) by mouth 2 (two) times daily.  . ciclopirox (LOPROX) 0.77 % cream Apply 1 application topically daily.  Marland Kitchen latanoprost (XALATAN) 0.005 % ophthalmic solution Place 1 drop into both eyes at bedtime.  . pantoprazole (PROTONIX) 40 MG tablet Take 40 mg by mouth Daily.   [name] Glycol-Propyl Glycol (SYSTANE OP) Place 1 drop into both eyes daily as needed (dry eyes).  . terazosin (HYTRIN) 10 MG capsule Take 1 tablet by mouth Daily.  Marland Kitchen triamcinolone cream (KENALOG) 0.1 % Apply 1 application topically as needed.     Allergies: Allergies  Allergen Reactions  . Procardia [Nifedipine] Other (See Comments)    Headaches     Social History: The patient  reports that he quit smoking about 20 years ago. His smoking use included cigarettes. He has a 8.00 pack-year smoking history. He has never used smokeless tobacco. He reports current alcohol use of about 14.0 standard drinks of alcohol per week. He reports  that he does not use drugs.   Family History: The patient's family history includes Alzheimer's disease in his mother; Cancer - Lung in his sister; Heart attack in his father.   Review of Systems: Please see the history of present illness.   All other systems are reviewed and negative.   Physical Exam: VS:  BP 128/86   Pulse 97   Ht 5\' 11"  (1.803 m)   Wt 160 lb (72.6 kg)   SpO2 99%   BMI 22.32 kg/m  .  BMI Body mass index is 22.32 kg/m.  Wt Readings from Last 3 Encounters:  05/20/20 160 lb (72.6 kg)  02/15/20 163 lb (73.9 kg)  01/11/20 159 lb (72.1 kg)   Affect  appropriate Healthy:  appears stated age HEENT: normal Neck supple with no adenopathy JVP normal no bruits no thyromegaly Lungs clear with no wheezing and good diaphragmatic motion Heart:  S1/S2 no murmur, no rub, gallop or click PMI normal Abdomen: benighn, BS positve, no tenderness, no AAA no bruit.  No HSM or HJR Distal pulses intact with no bruits No edema Neuro non-focal Skin warm and dry No muscular weakness    LABORATORY DATA:  EKG:   afib rate 65 chronic inferior lateral T wave inversions  02/15/20   Lab Results  Component Value Date   WBC 6.6 02/15/2020   HGB 15.2 02/15/2020   HCT 44.6 02/15/2020   PLT 181 02/15/2020   GLUCOSE 83 02/15/2020   ALT 18 11/11/2014   AST 26 11/11/2014   NA 141 02/15/2020   K 4.1 02/15/2020   CL 104 02/15/2020   CREATININE 1.17 02/15/2020   BUN 35 (H) 02/15/2020   CO2 23 02/15/2020   TSH 2.270 01/11/2020   INR 1.14 11/11/2014    Other Studies Reviewed Today:  Echo 1/15 Study Conclusions  - Left ventricle: The cavity size was normal. There was mild focal basal and moderate concentric hypertrophy of the septum without evidence of LVOT obstruction. Systolic function was normal. The estimated ejection fraction was in the range of 60% to 65%. Wall motion was normal; there were no regional wall motion abnormalities. Doppler parameters are consistent with abnormal left ventricular relaxation (grade 1 diastolic dysfunction). Doppler parameters are consistent with high ventricular filling pressure. - Mitral valve: Mild regurgitation. - Left atrium: The atrium was moderately dilated. - Right ventricle: The cavity size was moderately dilated. Wall thickness was normal. Systolic function was normal. - Right atrium: The atrium was mildly dilated. - Atrial septum: No defect or patent foramen ovale was identified. - Tricuspid valve: No regurgitation.    Assessment/Plan:  1. Persistent AF - On eliquis  -  long discussion with him and his significant other His rate is up a bit I think he should have one attempt at conversion. Risks including intubation, stroke and PPM discussed willing to proceed Has not missed any doses of eliquis Have scheduled for June 18 th Orders done endoscopy called and will have CBC/BMET 48 hours before conversion   2. HTN - Well controlled.  Continue current medications and low sodium Dash type diet.    3. Chronically abnormal EKG with lateral T wave changes - active with no angina observe     Current medicines are reviewed with the patient today.  The patient does not have concerns regarding medicines other than what has been noted above.  The following changes have been made:  See above.  Labs/ tests ordered today include: Pre cardioversion labs  No orders of the defined types were placed in this encounter.    Disposition:   FU in a year   Patient is agreeable to this plan and will call if any problems develop in the interim.   Signed: Jenkins Rouge, MD  05/20/2020 10:02 AM  [facility] [address] [place], [place]  [address] Phone: [phone] Fax: [phone]

## 2020-05-11 NOTE — Progress Notes (Signed)
CARDIOLOGY OFFICE NOTE  Date:  05/20/2020    Jared Neal Date of Birth: February 01, 1929 Medical Record [id number]  PCP:  [name], MD  Cardiologist:  [name]  No chief complaint on file.   History of Present Illness: Jared Neal is a 86 y.o. male who presents today for f/u afib  He has had a history of long standing HTN Other issues include COPD, GERD, , and chronically abnormal EKG. Did not tolerate Procardia in the past. Past Holter with only PACs and PVCs from 2013. Remote echo from 2015.   Seen by NP 01/11/20   he was concerned about his BP. But  found him to be in AF He was started on eliquis .     The patient does not have symptoms concerning for COVID-19 infection (fever, chills, cough, or new shortness of breath).   He has had mildly elevated HR. Mild dyspnea His hearing is poor and aids not working  Long discussion about diagnosis of afib and goals of Rx including rate control anticoagulation and conversion  Favor one attempt at conversion   Past Medical History:  Diagnosis Date  . Acute, but ill-defined, cerebrovascular disease 04/07/2014  . BPH (benign prostatic hyperplasia)   . COPD (chronic obstructive pulmonary disease) ([place])   . Eczema   . Erectile dysfunction   . GERD (gastroesophageal reflux disease)   . Heart murmur   . HOH (hard of hearing)   . HTN (hypertension)   . Meningioma (HCC)    Right frontal  . Meralgia paraesthetica   . Palpitations   . Urinary retention     Past Surgical History:  Procedure Laterality Date  . BLADDER DIVERTICULECTOMY  02/15/2003   Archie Endo 05/16/2011  . CARDIAC CATHETERIZATION    . CATARACT EXTRACTION Bilateral 2013  . CHOLECYSTECTOMY N/A 11/19/2014   Procedure: LAPAROSCOPIC CHOLECYSTECTOMY WITH INTRAOPERATIVE CHOLANGIOGRAM;  Surgeon: [name], MD;  Location: [facility];  Service: General;  Laterality: N/A;  . COLONOSCOPY W/ POLYPECTOMY    . GUM SURGERY  1999  . INGUINAL HERNIA REPAIR Left   .  INGUINAL HERNIA REPAIR Right 11/2009  . LAPAROSCOPIC CHOLECYSTECTOMY  11/19/2014   w/IOC  . MEATOTOMY  06/2003   Distal urethral meatotomy with YAG laser/notes 05/16/2011  . SUPRAPUBIC PROSTATECTOMY  02/15/2003   Archie Endo 05/16/2011     Medications: Current Meds  Medication Sig  . amLODipine (NORVASC) 5 MG tablet Take 1 tablet by mouth Daily.  Marland Kitchen apixaban (ELIQUIS) 5 MG TABS tablet Take 1 tablet (5 mg total) by mouth 2 (two) times daily.  . ciclopirox (LOPROX) 0.77 % cream Apply 1 application topically daily.  Marland Kitchen latanoprost (XALATAN) 0.005 % ophthalmic solution Place 1 drop into both eyes at bedtime.  . pantoprazole (PROTONIX) 40 MG tablet Take 40 mg by mouth Daily.   [name] Glycol-Propyl Glycol (SYSTANE OP) Place 1 drop into both eyes daily as needed (dry eyes).  . terazosin (HYTRIN) 10 MG capsule Take 1 tablet by mouth Daily.  Marland Kitchen triamcinolone cream (KENALOG) 0.1 % Apply 1 application topically as needed.     Allergies: Allergies  Allergen Reactions  . Procardia [Nifedipine] Other (See Comments)    Headaches     Social History: The patient  reports that he quit smoking about 20 years ago. His smoking use included cigarettes. He has a 8.00 pack-year smoking history. He has never used smokeless tobacco. He reports current alcohol use of about 14.0 standard drinks of alcohol per week. He reports  that he does not use drugs.   Family History: The patient's family history includes Alzheimer's disease in his mother; Cancer - Lung in his sister; Heart attack in his father.   Review of Systems: Please see the history of present illness.   All other systems are reviewed and negative.   Physical Exam: VS:  BP 128/86   Pulse 97   Ht 5\' 11"  (1.803 m)   Wt 160 lb (72.6 kg)   SpO2 99%   BMI 22.32 kg/m  .  BMI Body mass index is 22.32 kg/m.  Wt Readings from Last 3 Encounters:  05/20/20 160 lb (72.6 kg)  02/15/20 163 lb (73.9 kg)  01/11/20 159 lb (72.1 kg)   Affect  appropriate Healthy:  appears stated age HEENT: normal Neck supple with no adenopathy JVP normal no bruits no thyromegaly Lungs clear with no wheezing and good diaphragmatic motion Heart:  S1/S2 no murmur, no rub, gallop or click PMI normal Abdomen: benighn, BS positve, no tenderness, no AAA no bruit.  No HSM or HJR Distal pulses intact with no bruits No edema Neuro non-focal Skin warm and dry No muscular weakness    LABORATORY DATA:  EKG:   afib rate 65 chronic inferior lateral T wave inversions  02/15/20   Lab Results  Component Value Date   WBC 6.6 02/15/2020   HGB 15.2 02/15/2020   HCT 44.6 02/15/2020   PLT 181 02/15/2020   GLUCOSE 83 02/15/2020   ALT 18 11/11/2014   AST 26 11/11/2014   NA 141 02/15/2020   K 4.1 02/15/2020   CL 104 02/15/2020   CREATININE 1.17 02/15/2020   BUN 35 (H) 02/15/2020   CO2 23 02/15/2020   TSH 2.270 01/11/2020   INR 1.14 11/11/2014    Other Studies Reviewed Today:  Echo 1/15 Study Conclusions  - Left ventricle: The cavity size was normal. There was mild focal basal and moderate concentric hypertrophy of the septum without evidence of LVOT obstruction. Systolic function was normal. The estimated ejection fraction was in the range of 60% to 65%. Wall motion was normal; there were no regional wall motion abnormalities. Doppler parameters are consistent with abnormal left ventricular relaxation (grade 1 diastolic dysfunction). Doppler parameters are consistent with high ventricular filling pressure. - Mitral valve: Mild regurgitation. - Left atrium: The atrium was moderately dilated. - Right ventricle: The cavity size was moderately dilated. Wall thickness was normal. Systolic function was normal. - Right atrium: The atrium was mildly dilated. - Atrial septum: No defect or patent foramen ovale was identified. - Tricuspid valve: No regurgitation.    Assessment/Plan:  1. Persistent AF - On eliquis  -  long discussion with him and his significant other His rate is up a bit I think he should have one attempt at conversion. Risks including intubation, stroke and PPM discussed willing to proceed Has not missed any doses of eliquis Have scheduled for June 18 th Orders done endoscopy called and will have CBC/BMET 48 hours before conversion   2. HTN - Well controlled.  Continue current medications and low sodium Dash type diet.    3. Chronically abnormal EKG with lateral T wave changes - active with no angina observe     Current medicines are reviewed with the patient today.  The patient does not have concerns regarding medicines other than what has been noted above.  The following changes have been made:  See above.  Labs/ tests ordered today include: Pre cardioversion labs  No orders of the defined types were placed in this encounter.    Disposition:   FU in a year   Patient is agreeable to this plan and will call if any problems develop in the interim.   Signed: Jenkins Rouge, MD  05/20/2020 10:02 AM  [facility] [address] [place], [place]  [address] Phone: [phone] Fax: [phone]

## 2020-05-15 ENCOUNTER — Other Ambulatory Visit: Payer: Self-pay

## 2020-05-15 ENCOUNTER — Emergency Department (HOSPITAL_COMMUNITY)
Admission: EM | Admit: 2020-05-15 | Discharge: 2020-05-15 | Disposition: A | Discharge status: Home / Self Care | Payer: Medicare Other | Attending: Emergency Medicine | Admitting: Emergency Medicine

## 2020-05-15 DIAGNOSIS — Y9389 Activity, other specified: Secondary | ICD-10-CM | POA: Diagnosis not present

## 2020-05-15 DIAGNOSIS — T161XXA Foreign body in right ear, initial encounter: Secondary | ICD-10-CM | POA: Diagnosis not present

## 2020-05-15 DIAGNOSIS — Y999 Unspecified external cause status: Secondary | ICD-10-CM | POA: Insufficient documentation

## 2020-05-15 DIAGNOSIS — Z7901 Long term (current) use of anticoagulants: Secondary | ICD-10-CM | POA: Insufficient documentation

## 2020-05-15 DIAGNOSIS — H918X9 Other specified hearing loss, unspecified ear: Secondary | ICD-10-CM | POA: Insufficient documentation

## 2020-05-15 DIAGNOSIS — Y9289 Other specified places as the place of occurrence of the external cause: Secondary | ICD-10-CM | POA: Diagnosis not present

## 2020-05-15 DIAGNOSIS — Z87891 Personal history of nicotine dependence: Secondary | ICD-10-CM | POA: Insufficient documentation

## 2020-05-15 DIAGNOSIS — J449 Chronic obstructive pulmonary disease, unspecified: Secondary | ICD-10-CM | POA: Diagnosis not present

## 2020-05-15 DIAGNOSIS — Z974 Presence of external hearing-aid: Secondary | ICD-10-CM | POA: Diagnosis not present

## 2020-05-15 DIAGNOSIS — X58XXXA Exposure to other specified factors, initial encounter: Secondary | ICD-10-CM | POA: Diagnosis not present

## 2020-05-15 DIAGNOSIS — I10 Essential (primary) hypertension: Secondary | ICD-10-CM | POA: Diagnosis not present

## 2020-05-15 DIAGNOSIS — Z79899 Other long term (current) drug therapy: Secondary | ICD-10-CM | POA: Diagnosis not present

## 2020-05-15 NOTE — ED Provider Notes (Signed)
[facility] DEPT Provider Note   CSN: [id number] Arrival date & time: 05/15/20  1836     History No chief complaint on file.   Jared Neal is a 86 y.o. male.  86yo male with complaint of FB in right ear canal. Patient removed his hearing aid today and the ear bud/tip remained in the canal. Patient's partner attempted to remove the FB without success. No other complaints.        Past Medical History:  Diagnosis Date  . Acute, but ill-defined, cerebrovascular disease 04/07/2014  . BPH (benign prostatic hyperplasia)   . COPD (chronic obstructive pulmonary disease) ([place])   . Eczema   . Erectile dysfunction   . GERD (gastroesophageal reflux disease)   . Heart murmur   . HOH (hard of hearing)   . HTN (hypertension)   . Meningioma ([facility])    Right frontal  . Meralgia paraesthetica   . Palpitations   . Urinary retention     Patient Active Problem List   Diagnosis Date Noted  . Symptomatic cholelithiasis 11/19/2014  . Acute, but ill-defined, cerebrovascular disease 04/07/2014  . Murmur 12/16/2013  . Abnormal ECG 12/15/2012  . Palpitations   . HTN (hypertension)   . GERD (gastroesophageal reflux disease)   . Urinary retention   . Eczema   . COPD (chronic obstructive pulmonary disease) ([place])   . Erectile dysfunction   . Meralgia paraesthetica     Past Surgical History:  Procedure Laterality Date  . BLADDER DIVERTICULECTOMY  02/15/2003   Archie Endo 05/16/2011  . CARDIAC CATHETERIZATION    . CATARACT EXTRACTION Bilateral 2013  . CHOLECYSTECTOMY N/A 11/19/2014   Procedure: LAPAROSCOPIC CHOLECYSTECTOMY WITH INTRAOPERATIVE CHOLANGIOGRAM;  Surgeon: [name], MD;  Location: [facility];  Service: General;  Laterality: N/A;  . COLONOSCOPY W/ POLYPECTOMY    . GUM SURGERY  1999  . INGUINAL HERNIA REPAIR Left   . INGUINAL HERNIA REPAIR Right 11/2009  . LAPAROSCOPIC CHOLECYSTECTOMY  11/19/2014   w/IOC  . MEATOTOMY  06/2003   Distal urethral  meatotomy with YAG laser/notes 05/16/2011  . SUPRAPUBIC PROSTATECTOMY  02/15/2003   Archie Endo 05/16/2011       Family History  Problem Relation Age of Onset  . Alzheimer's disease Mother   . Heart attack Father   . Cancer - Lung Sister     Social History   Tobacco Use  . Smoking status: Former Smoker    Packs/day: 0.20    Years: 40.00    Pack years: 8.00    Types: Cigarettes    Quit date: 11/12/1999    Years since quitting: 20.5  . Smokeless tobacco: Never Used  Substance Use Topics  . Alcohol use: Yes    Alcohol/week: 14.0 standard drinks    Types: 14 Glasses of wine per week    Comment: occ  . Drug use: No    Home Medications Prior to Admission medications   Medication Sig Start Date End Date Taking? Authorizing Provider  amLODipine (NORVASC) 5 MG tablet Take 1 tablet by mouth Daily. 11/20/12   [provider]  apixaban (ELIQUIS) 5 MG TABS tablet Take 1 tablet (5 mg total) by mouth 2 (two) times daily. 01/11/20   Burtis Junes, NP  ciclopirox (LOPROX) 0.77 % cream Apply 1 application topically daily. 01/20/20   [provider]  latanoprost (XALATAN) 0.005 % ophthalmic solution Place 1 drop into both eyes at bedtime. 12/14/19   [provider]  pantoprazole (PROTONIX) 40 MG tablet Take 40  mg by mouth Daily.  11/20/12   [provider]  Polyethyl Glycol-Propyl Glycol (SYSTANE OP) Place 1 drop into both eyes daily as needed (dry eyes).    [provider]  terazosin (HYTRIN) 10 MG capsule Take 1 tablet by mouth Daily. 11/21/12   [provider]  triamcinolone cream (KENALOG) 0.1 % Apply 1 application topically as needed. 02/09/20   [provider]    Allergies    Procardia [nifedipine]  Review of Systems   Review of Systems  HENT: Positive for ear pain.     Physical Exam Updated Vital Signs There were no vitals taken for this visit.  Physical Exam Vitals and nursing note reviewed.  Constitutional:       General: He is not in acute distress.    Appearance: He is well-developed. He is not diaphoretic.  HENT:     Head: Normocephalic and atraumatic.     Right Ear: A foreign body is present.  Pulmonary:     Effort: Pulmonary effort is normal.  Neurological:     Mental Status: He is alert and oriented to person, place, and time.  Psychiatric:        Behavior: Behavior normal.     ED Results / Procedures / Treatments   Labs (all labs ordered are listed, but only abnormal results are displayed) Labs Reviewed - No data to display  EKG None  Radiology No results found.  Procedures .Foreign Body Removal  Date/Time: 05/15/2020 8:19 PM Performed by: Tacy Learn, PA-C Authorized by: Tacy Learn, PA-C  Consent: Verbal consent obtained. Written consent not obtained. Risks and benefits: risks, benefits and alternatives were discussed Consent given by: patient Patient understanding: patient states understanding of the procedure being performed Patient consent: the patient's understanding of the procedure matches consent given Patient identity confirmed: verbally with patient Body area: ear Location details: right ear  Sedation: Patient sedated: no  Patient restrained: no Patient cooperative: yes Localization method: visualized Removal mechanism: alligator forceps Complexity: simple 1 objects recovered. Objects recovered: rubber ear bud Post-procedure assessment: foreign body removed Patient tolerance: patient tolerated the procedure well with no immediate complications Comments: TM appears injected post removal, TM appears intact.   (including critical care time)  Medications Ordered in ED Medications - No data to display  ED Course  I have reviewed the triage vital signs and the nursing notes.  Pertinent labs & imaging results that were available during my care of the patient were reviewed by me and considered in my medical decision making (see chart for  details).  Clinical Course as of May 15 2020  Sun May 15, 4564  6443 86 year old male with earbud in right ear from hearing aid, failed to remove at home.  On exam, foreign body visualized in right ear canal, removed without difficulty with alligators.  Repeat inspection of right ear shows intact injected right TM.  Advised patient to keep ear  dry, do not insert anything into the ear, recommend recheck with PCP.   [LM]    Clinical Course User Index [LM] Roque Lias   MDM Rules/Calculators/A&P                      Final Clinical Impression(s) / ED Diagnoses Final diagnoses:  Acute foreign body of right ear canal, initial encounter    Rx / DC Orders ED Discharge Orders    None       Tacy Learn, PA-C 05/15/20 2021  Deno Etienne, DO 05/15/20 2232

## 2020-05-15 NOTE — ED Notes (Signed)
Pt was seen immediatly seen by a Percell Miller PA before triage process could be started. Pt seen and DC'd.  Pt verbalizes understanding of DC instructions. Pt belongings returned and is ambulatory out of ED.

## 2020-05-15 NOTE — Discharge Instructions (Addendum)
Keep your ear canal dry, do not put anything in the ear until your doctor sees you in recheck.

## 2020-05-16 DIAGNOSIS — Z961 Presence of intraocular lens: Secondary | ICD-10-CM | POA: Diagnosis not present

## 2020-05-16 DIAGNOSIS — H1045 Other chronic allergic conjunctivitis: Secondary | ICD-10-CM | POA: Diagnosis not present

## 2020-05-16 DIAGNOSIS — H04123 Dry eye syndrome of bilateral lacrimal glands: Secondary | ICD-10-CM | POA: Diagnosis not present

## 2020-05-16 DIAGNOSIS — H43812 Vitreous degeneration, left eye: Secondary | ICD-10-CM | POA: Diagnosis not present

## 2020-05-16 DIAGNOSIS — H47292 Other optic atrophy, left eye: Secondary | ICD-10-CM | POA: Diagnosis not present

## 2020-05-16 DIAGNOSIS — H401131 Primary open-angle glaucoma, bilateral, mild stage: Secondary | ICD-10-CM | POA: Diagnosis not present

## 2020-05-17 DIAGNOSIS — H60331 Swimmer's ear, right ear: Secondary | ICD-10-CM | POA: Diagnosis not present

## 2020-05-19 DIAGNOSIS — S01311A Laceration without foreign body of right ear, initial encounter: Secondary | ICD-10-CM | POA: Diagnosis not present

## 2020-05-20 ENCOUNTER — Other Ambulatory Visit: Payer: Self-pay

## 2020-05-20 ENCOUNTER — Ambulatory Visit (INDEPENDENT_AMBULATORY_CARE_PROVIDER_SITE_OTHER): Payer: Medicare Other | Admitting: Cardiovascular Disease

## 2020-05-20 ENCOUNTER — Encounter: Payer: Self-pay | Admitting: Cardiovascular Disease

## 2020-05-20 VITALS — BP 128/86 | HR 97 | Ht 71.0 in | Wt 160.0 lb

## 2020-05-20 DIAGNOSIS — I4891 Unspecified atrial fibrillation: Secondary | ICD-10-CM

## 2020-05-20 DIAGNOSIS — I4819 Other persistent atrial fibrillation: Secondary | ICD-10-CM | POA: Diagnosis not present

## 2020-05-20 NOTE — Patient Instructions (Signed)
Medication Instructions:  *If you need a refill on your cardiac medications before your next appointment, please call your pharmacy*  Lab Work: If you have labs (blood work) drawn today and your tests are completely normal, you will receive your results only by: Marland Kitchen MyChart Message (if you have MyChart) OR . A paper copy in the mail If you have any lab test that is abnormal or we need to change your treatment, we will call you to review the results.  Testing/Procedures: Your physician has recommended that you have a Cardioversion (DCCV). Electrical Cardioversion uses a jolt of electricity to your heart either through paddles or wired patches attached to your chest. This is a controlled, usually prescheduled, procedure. Defibrillation is done under light anesthesia in the hospital, and you usually go home the day of the procedure. This is done to get your heart back into a normal rhythm. You are not awake for the procedure. Please see the instruction sheet given to you today.  Follow-Up: At [facility], you and your health needs are our priority.  As part of our continuing mission to provide you with exceptional heart care, we have created designated Provider Care Teams.  These Care Teams include your primary Cardiologist (physician) and Advanced Practice Providers (APPs -  Physician Assistants and Nurse Practitioners) who all work together to provide you with the care you need, when you need it.  We recommend signing up for the patient portal called "MyChart".  Sign up information is provided on this After Visit Summary.  MyChart is used to connect with patients for Virtual Visits (Telemedicine).  Patients are able to view lab/test results, encounter notes, upcoming appointments, etc.  Non-urgent messages can be sent to your provider as well.   To learn more about what you can do with MyChart, go to [email or web address].    Your next appointment:   2 month(s)  The format for your next  appointment:   In Person  Provider:   You may see Dr. Johnsie Cancel or one of the following Advanced Practice Providers on your designated Care Team:    Truitt Merle, NP  Cecilie Kicks, NP  Kathyrn Drown, NP

## 2020-05-20 NOTE — Progress Notes (Addendum)
Lab work orders for cardioversion.  Patient having COVID screening and lab work done on 06/15/20.

## 2020-05-24 ENCOUNTER — Other Ambulatory Visit: Payer: Self-pay | Admitting: Cardiovascular Disease

## 2020-06-01 IMAGING — DX DG CHEST 2V
2 series · 2 of 2 positions shown · non-contrast
Comparison: 11/30/2009

CLINICAL DATA: 89-year-old with cough and left lower lobe rales.

EXAM:
CHEST - 2 VIEW

[chest pa]
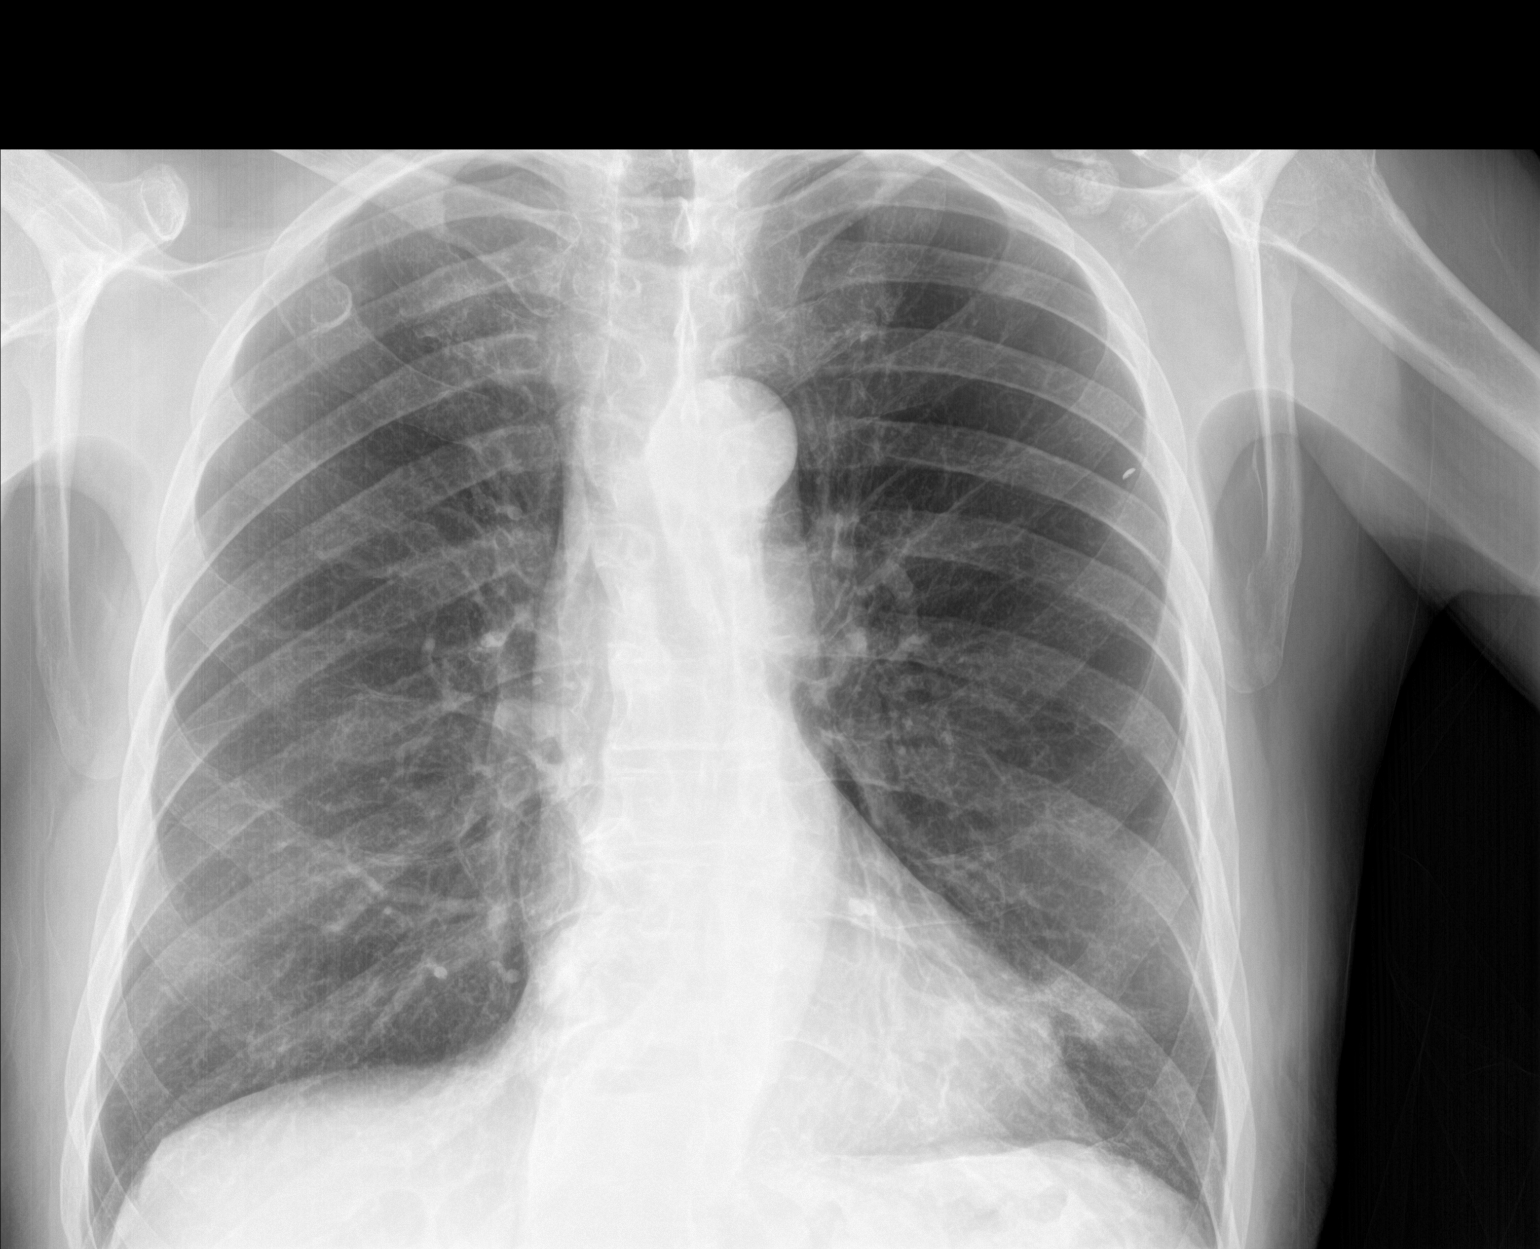

[chest lat]
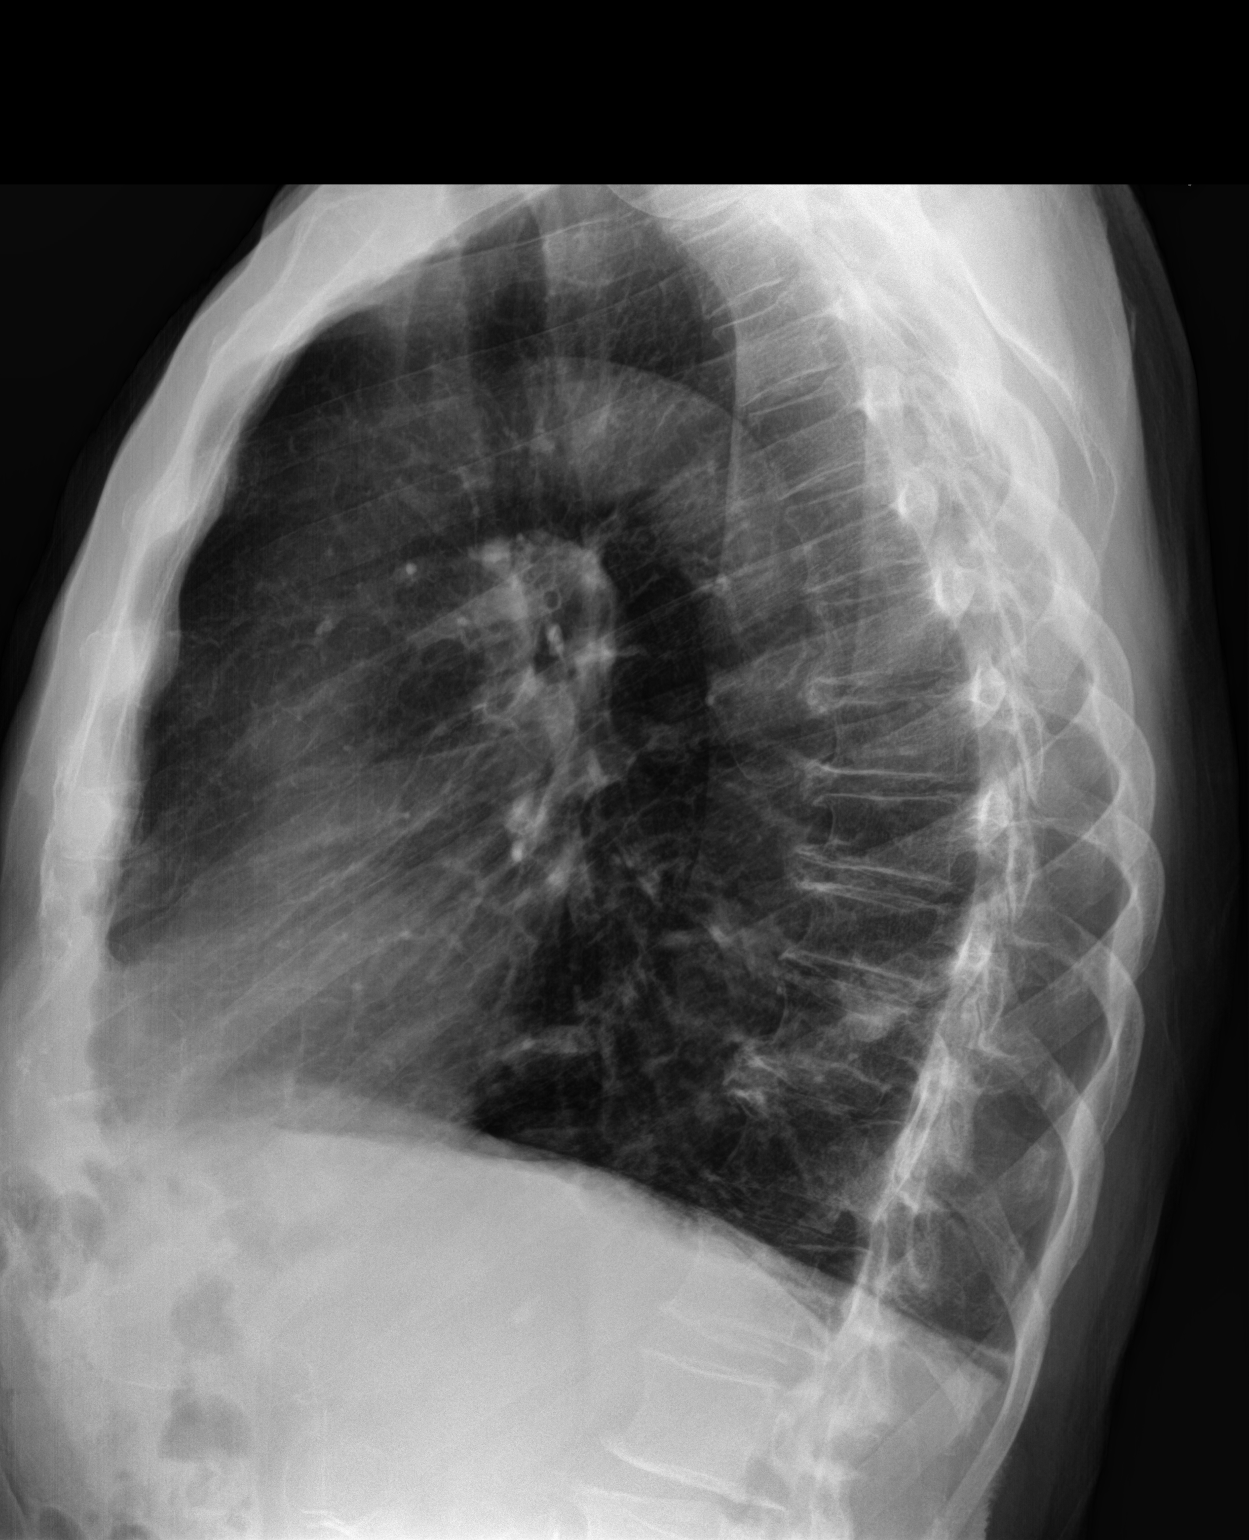

[2 of 2 positions shown; findings below may reference images not displayed]

FINDINGS: Again noted is hyperinflation of the lungs with flattening of the
hemidiaphragms. A few patchy densities at the left lung base.
Otherwise, the lungs are clear. Heart and mediastinum are within
normal limits. Trachea is midline. Again noted are multiple
ossifications or loose bodies near the left scapular coracoid
process. No acute bone abnormalities. Degenerative endplate changes
in lower thoracic spine.
IMPRESSION: 1. Subtle patchy densities at the left lung base. Findings could
represent a small focus of infection.
2. Stable hyperinflation and compatible with COPD.

## 2020-06-09 ENCOUNTER — Encounter: Payer: Self-pay | Admitting: Gastroenterology

## 2020-06-15 ENCOUNTER — Other Ambulatory Visit: Payer: Self-pay

## 2020-06-15 ENCOUNTER — Other Ambulatory Visit: Payer: Medicare Other | Admitting: *Deleted

## 2020-06-15 ENCOUNTER — Other Ambulatory Visit (HOSPITAL_COMMUNITY)
Admission: RE | Admit: 2020-06-15 | Discharge: 2020-06-15 | Disposition: A | Discharge status: Home / Self Care | Payer: Medicare Other | Source: Ambulatory Visit | Attending: Cardiovascular Disease | Admitting: Cardiovascular Disease

## 2020-06-15 ENCOUNTER — Telehealth: Payer: Self-pay

## 2020-06-15 DIAGNOSIS — Z01812 Encounter for preprocedural laboratory examination: Secondary | ICD-10-CM | POA: Diagnosis present

## 2020-06-15 DIAGNOSIS — I4891 Unspecified atrial fibrillation: Secondary | ICD-10-CM | POA: Diagnosis not present

## 2020-06-15 DIAGNOSIS — Z20822 Contact with and (suspected) exposure to covid-19: Secondary | ICD-10-CM | POA: Insufficient documentation

## 2020-06-15 DIAGNOSIS — R7989 Other specified abnormal findings of blood chemistry: Secondary | ICD-10-CM

## 2020-06-15 LAB — SARS CORONAVIRUS 2 (TAT 6-24 HRS): SARS Coronavirus 2: NEGATIVE

## 2020-06-15 LAB — CBC WITH DIFFERENTIAL/PLATELET
Basophils Absolute: 0 10*3/uL (ref 0.0–0.2)
Basos: 0 %
EOS (ABSOLUTE): 0.1 10*3/uL (ref 0.0–0.4)
Eos: 1 %
Hematocrit: 47.2 % (ref 37.5–51.0)
Hemoglobin: 16.6 g/dL (ref 13.0–17.7)
Lymphocytes Absolute: 4.3 10*3/uL — ABNORMAL HIGH (ref 0.7–3.1)
Lymphs: 48 %
MCH: 29 pg (ref 26.6–33.0)
MCHC: 35.2 g/dL (ref 31.5–35.7)
MCV: 83 fL (ref 79–97)
Monocytes Absolute: 0.7 10*3/uL (ref 0.1–0.9)
Monocytes: 8 %
Neutrophils Absolute: 3.9 10*3/uL (ref 1.4–7.0)
Neutrophils: 43 %
Platelets: 177 10*3/uL (ref 150–450)
RBC: 5.72 x10E6/uL (ref 4.14–5.80)
RDW: 15.1 % (ref 11.6–15.4)
WBC: 9 10*3/uL (ref 3.4–10.8)

## 2020-06-15 LAB — BASIC METABOLIC PANEL
BUN/Creatinine Ratio: 24 (ref 10–24)
BUN: 41 mg/dL — ABNORMAL HIGH (ref 8–27)
CO2: 27 mmol/L (ref 20–29)
Calcium: 9.3 mg/dL (ref 8.6–10.2)
Chloride: 102 mmol/L (ref 96–106)
Creatinine, Ser: 1.72 mg/dL — ABNORMAL HIGH (ref 0.76–1.27)
GFR calc Af Amer: 40 mL/min/{1.73_m2} — ABNORMAL LOW (ref >59)
GFR calc non Af Amer: 34 mL/min/{1.73_m2} — ABNORMAL LOW (ref >59)
Glucose: 121 mg/dL — ABNORMAL HIGH (ref 65–99)
Potassium: 4.2 mmol/L (ref 3.5–5.2)
Sodium: 137 mmol/L (ref 134–144)

## 2020-06-15 NOTE — Telephone Encounter (Signed)
-----   Message from Leeroy Bock, [place] sent at 06/15/2020  1:56 PM EDT ----- Agree would continue current dose of Eliquis since he's having cardioversion this week. If BMET after DCCV shows SCr remains elevated above 1.5, then can decrease Eliquis to 2.5mg  BID.

## 2020-06-15 NOTE — Telephone Encounter (Signed)
Called patient to let him know that he will need repeat lab work next week. Will schedule him for BMET on 06/22/20.

## 2020-06-16 NOTE — Anesthesia Preprocedure Evaluation (Addendum)
Anesthesia Evaluation  Patient identified by MRN, date of birth, ID band Patient awake    Reviewed: Allergy & Precautions, NPO status , Patient's Chart, lab work & pertinent test results  History of Anesthesia Complications Negative for: history of anesthetic complications  Airway Mallampati: I  TM Distance: >3 FB Neck ROM: Full    Dental no notable dental hx.    Pulmonary COPD, former smoker,    Pulmonary exam normal        Cardiovascular hypertension, Pt. on medications Normal cardiovascular exam+ dysrhythmias (on Eliquis) Atrial Fibrillation   TTE 2015: moderate concentric hypertrophy of theseptum without evidence of LVOT obstruction, EF 60-65%, grade 1 DD, mild MR, moderate LAE, moderate RAE    Neuro/Psych negative neurological ROS  negative psych ROS   GI/Hepatic Neg liver ROS, GERD  Medicated and Controlled,  Endo/Other  negative endocrine ROS  Renal/GU negative Renal ROS  negative genitourinary   Musculoskeletal negative musculoskeletal ROS (+)   Abdominal   Peds  Hematology negative hematology ROS (+)   Anesthesia Other Findings Day of surgery medications reviewed with patient.  Reproductive/Obstetrics negative OB ROS                            Anesthesia Physical Anesthesia Plan  ASA: III  Anesthesia Plan: General   Post-op Pain Management:    Induction: Intravenous  PONV Risk Score and Plan: Treatment may vary due to age or medical condition and Propofol infusion  Airway Management Planned: Mask  Additional Equipment: None  Intra-op Plan:   Post-operative Plan:   Informed Consent: I have reviewed the patients History and Physical, chart, labs and discussed the procedure including the risks, benefits and alternatives for the proposed anesthesia with the patient or authorized representative who has indicated his/her understanding and acceptance.       Plan  Discussed with: CRNA  Anesthesia Plan Comments:        Anesthesia Quick Evaluation

## 2020-06-17 ENCOUNTER — Encounter (HOSPITAL_COMMUNITY)
Admission: RE | Disposition: A | Discharge status: Home / Self Care | Payer: Self-pay | Source: Home / Self Care | Attending: Cardiovascular Disease

## 2020-06-17 ENCOUNTER — Other Ambulatory Visit: Payer: Self-pay

## 2020-06-17 ENCOUNTER — Ambulatory Visit (HOSPITAL_COMMUNITY): Payer: Medicare Other | Admitting: Anesthesiology

## 2020-06-17 ENCOUNTER — Ambulatory Visit (HOSPITAL_COMMUNITY)
Admission: RE | Admit: 2020-06-17 | Discharge: 2020-06-17 | Disposition: A | Discharge status: Home / Self Care | Payer: Medicare Other | Attending: Cardiovascular Disease | Admitting: Cardiovascular Disease

## 2020-06-17 ENCOUNTER — Encounter (HOSPITAL_COMMUNITY): Payer: Self-pay | Admitting: Cardiovascular Disease

## 2020-06-17 DIAGNOSIS — K219 Gastro-esophageal reflux disease without esophagitis: Secondary | ICD-10-CM | POA: Diagnosis not present

## 2020-06-17 DIAGNOSIS — J449 Chronic obstructive pulmonary disease, unspecified: Secondary | ICD-10-CM | POA: Insufficient documentation

## 2020-06-17 DIAGNOSIS — Z7901 Long term (current) use of anticoagulants: Secondary | ICD-10-CM | POA: Diagnosis not present

## 2020-06-17 DIAGNOSIS — I4819 Other persistent atrial fibrillation: Secondary | ICD-10-CM | POA: Insufficient documentation

## 2020-06-17 DIAGNOSIS — N400 Enlarged prostate without lower urinary tract symptoms: Secondary | ICD-10-CM | POA: Diagnosis not present

## 2020-06-17 DIAGNOSIS — Z79899 Other long term (current) drug therapy: Secondary | ICD-10-CM | POA: Insufficient documentation

## 2020-06-17 DIAGNOSIS — I4891 Unspecified atrial fibrillation: Secondary | ICD-10-CM | POA: Diagnosis not present

## 2020-06-17 DIAGNOSIS — I10 Essential (primary) hypertension: Secondary | ICD-10-CM | POA: Diagnosis not present

## 2020-06-17 HISTORY — PX: CARDIOVERSION: SHX1299

## 2020-06-17 SURGERY — CARDIOVERSION
Anesthesia: General

## 2020-06-17 MED ORDER — PROPOFOL 10 MG/ML IV BOLUS
INTRAVENOUS | Status: DC | PRN
  Administered 2020-06-17: 70 mg via INTRAVENOUS

## 2020-06-17 MED ORDER — LIDOCAINE 2% (20 MG/ML) 5 ML SYRINGE
INTRAMUSCULAR | Status: DC | PRN
  Administered 2020-06-17: 60 mg via INTRAVENOUS

## 2020-06-17 NOTE — Transfer of Care (Signed)
Immediate Anesthesia Transfer of Care Note  Patient: Jared Neal  Procedure(s) Performed: CARDIOVERSION (N/A )  Patient Location: Endoscopy Unit  Anesthesia Type:General  Level of Consciousness: drowsy  Airway & Oxygen Therapy: Patient Spontanous Breathing  Post-op Assessment: Report given to RN and Post -op Vital signs reviewed and stable  Post vital signs: Reviewed and stable  Last Vitals:  Vitals Value Taken Time  BP    Temp    Pulse    Resp    SpO2      Last Pain:  Vitals:   06/17/20 0725  TempSrc: Temporal  PainSc: 0-No pain         Complications: No complications documented.

## 2020-06-17 NOTE — Interval H&P Note (Signed)
History and Physical Interval Note:  06/17/2020 7:22 AM  Jared Neal  has presented today for surgery, with the diagnosis of AFIB.  The various methods of treatment have been discussed with the patient and family. After consideration of risks, benefits and other options for treatment, the patient has consented to  Procedure(s): CARDIOVERSION (N/A) as a surgical intervention.  The patient's history has been reviewed, patient examined, no change in status, stable for surgery.  I have reviewed the patient's chart and labs.  Questions were answered to the patient's satisfaction.     Jenkins Rouge

## 2020-06-17 NOTE — Op Note (Signed)
DCC: Anesthesia Propofol  DCC x 1 150 J  Converted to NSR rate 58 bpm No missed doses of eliquis No immediate neurologic sequelae  Jenkins Rouge MD [facility]

## 2020-06-17 NOTE — Anesthesia Procedure Notes (Signed)
Procedure Name: General with mask airway Performed by: Valda Favia, CRNA Pre-anesthesia Checklist: Suction available, Patient identified, Emergency Drugs available, Patient being monitored and Timeout performed Patient Re-evaluated:Patient Re-evaluated prior to induction Oxygen Delivery Method: Ambu bag Preoxygenation: Pre-oxygenation with 100% oxygen Induction Type: IV induction Ventilation: Mask ventilation without difficulty Placement Confirmation: positive ETCO2 Dental Injury: Teeth and Oropharynx as per pre-operative assessment

## 2020-06-17 NOTE — Discharge Instructions (Signed)
Electrical Cardioversion Electrical cardioversion is the delivery of a jolt of electricity to restore a normal rhythm to the heart. A rhythm that is too fast or is not regular keeps the heart from pumping well. In this procedure, sticky patches or metal paddles are placed on the chest to deliver electricity to the heart from a device. This procedure may be done in an emergency if:  There is low or no blood pressure as a result of the heart rhythm.  Normal rhythm must be restored as fast as possible to protect the brain and heart from further damage.  It may save a life. This may also be a scheduled procedure for irregular or fast heart rhythms that are not immediately life-threatening. Tell a health care provider about:  Any allergies you have.  All medicines you are taking, including vitamins, herbs, eye drops, creams, and over-the-counter medicines.  Any problems you or family members have had with anesthetic medicines.  Any blood disorders you have.  Any surgeries you have had.  Any medical conditions you have.  Whether you are pregnant or may be pregnant. What are the risks? Generally, this is a safe procedure. However, problems may occur, including:  Allergic reactions to medicines.  A blood clot that breaks free and travels to other parts of your body.  The possible return of an abnormal heart rhythm within hours or days after the procedure.  Your heart stopping (cardiac arrest). This is rare. What happens before the procedure? Medicines  Your health care provider may have you start taking: ? Blood-thinning medicines (anticoagulants) so your blood does not clot as easily. ? Medicines to help stabilize your heart rate and rhythm.  Ask your health care provider about: ? Changing or stopping your regular medicines. This is especially important if you are taking diabetes medicines or blood thinners. ? Taking medicines such as aspirin and ibuprofen. These medicines can  thin your blood. Do not take these medicines unless your health care provider tells you to take them. ? Taking over-the-counter medicines, vitamins, herbs, and supplements. General instructions  Follow instructions from your health care provider about eating or drinking restrictions.  Plan to have someone take you home from the hospital or clinic.  If you will be going home right after the procedure, plan to have someone with you for 24 hours.  Ask your health care provider what steps will be taken to help prevent infection. These may include washing your skin with a germ-killing soap. What happens during the procedure?   An IV will be inserted into one of your veins.  Sticky patches (electrodes) or metal paddles may be placed on your chest.  You will be given a medicine to help you relax (sedative).  An electrical shock will be delivered. The procedure may vary among health care providers and hospitals. What can I expect after the procedure?  Your blood pressure, heart rate, breathing rate, and blood oxygen level will be monitored until you leave the hospital or clinic.  Your heart rhythm will be watched to make sure it does not change.  You may have some redness on the skin where the shocks were given. Follow these instructions at home:  Do not drive for 24 hours if you were given a sedative during your procedure.  Take over-the-counter and prescription medicines only as told by your health care provider.  Ask your health care provider how to check your pulse. Check it often.  Rest for 48 hours after the procedure or   as told by your health care provider.  Avoid or limit your caffeine use as told by your health care provider.  Keep all follow-up visits as told by your health care provider. This is important. Contact a health care provider if:  You feel like your heart is beating too quickly or your pulse is not regular.  You have a serious muscle cramp that does not go  away. Get help right away if:  You have discomfort in your chest.  You are dizzy or you feel faint.  You have trouble breathing or you are short of breath.  Your speech is slurred.  You have trouble moving an arm or leg on one side of your body.  Your fingers or toes turn cold or blue. Summary  Electrical cardioversion is the delivery of a jolt of electricity to restore a normal rhythm to the heart.  This procedure may be done right away in an emergency or may be a scheduled procedure if the condition is not an emergency.  Generally, this is a safe procedure.  After the procedure, check your pulse often as told by your health care provider. This information is not intended to replace advice given to you by your health care provider. Make sure you discuss any questions you have with your health care provider. Document Revised: 07/20/2019 Document Reviewed: 07/20/2019 Elsevier Patient Education  2020 [employer]  

## 2020-06-17 NOTE — Anesthesia Postprocedure Evaluation (Signed)
Anesthesia Post Note  Patient: Jared Neal  Procedure(s) Performed: CARDIOVERSION (N/A )     Patient location during evaluation: PACU Anesthesia Type: General Level of consciousness: awake and alert and oriented Pain management: pain level controlled Vital Signs Assessment: post-procedure vital signs reviewed and stable Respiratory status: spontaneous breathing, nonlabored ventilation and respiratory function stable Cardiovascular status: blood pressure returned to baseline Postop Assessment: no apparent nausea or vomiting Anesthetic complications: no   No complications documented.  Last Vitals:  Vitals:   06/17/20 0856 06/17/20 0906  BP: (!) 88/48 114/63  Pulse: (!) 55 (!) 40  Resp: 16 16  Temp:    SpO2: 98% 100%    Last Pain:  Vitals:   06/17/20 0856  TempSrc:   PainSc: (P) 0-No pain                 Brennan Bailey

## 2020-06-22 ENCOUNTER — Other Ambulatory Visit: Payer: Self-pay

## 2020-06-22 ENCOUNTER — Other Ambulatory Visit: Payer: Medicare Other | Admitting: *Deleted

## 2020-06-22 DIAGNOSIS — R7989 Other specified abnormal findings of blood chemistry: Secondary | ICD-10-CM

## 2020-06-22 LAB — BASIC METABOLIC PANEL
BUN/Creatinine Ratio: 25 — ABNORMAL HIGH (ref 10–24)
BUN: 36 mg/dL — ABNORMAL HIGH (ref 8–27)
CO2: 23 mmol/L (ref 20–29)
Calcium: 9.3 mg/dL (ref 8.6–10.2)
Chloride: 101 mmol/L (ref 96–106)
Creatinine, Ser: 1.43 mg/dL — ABNORMAL HIGH (ref 0.76–1.27)
GFR calc Af Amer: 50 mL/min/{1.73_m2} — ABNORMAL LOW (ref >59)
GFR calc non Af Amer: 43 mL/min/{1.73_m2} — ABNORMAL LOW (ref >59)
Glucose: 119 mg/dL — ABNORMAL HIGH (ref 65–99)
Potassium: 4.2 mmol/L (ref 3.5–5.2)
Sodium: 135 mmol/L (ref 134–144)

## 2020-06-23 ENCOUNTER — Other Ambulatory Visit: Payer: Self-pay | Admitting: *Deleted

## 2020-06-23 DIAGNOSIS — I4891 Unspecified atrial fibrillation: Secondary | ICD-10-CM

## 2020-06-23 DIAGNOSIS — Z79899 Other long term (current) drug therapy: Secondary | ICD-10-CM

## 2020-06-23 NOTE — Progress Notes (Signed)
CBC and BMP on 09/23/20

## 2020-06-28 ENCOUNTER — Other Ambulatory Visit: Payer: Self-pay | Admitting: Gastroenterology

## 2020-06-28 ENCOUNTER — Encounter: Payer: Self-pay | Admitting: Gastroenterology

## 2020-06-28 ENCOUNTER — Ambulatory Visit (INDEPENDENT_AMBULATORY_CARE_PROVIDER_SITE_OTHER): Payer: Medicare Other | Admitting: Gastroenterology

## 2020-06-28 ENCOUNTER — Other Ambulatory Visit (INDEPENDENT_AMBULATORY_CARE_PROVIDER_SITE_OTHER): Payer: Medicare Other

## 2020-06-28 VITALS — BP 100/60 | HR 77 | Ht 71.0 in | Wt 150.0 lb

## 2020-06-28 DIAGNOSIS — R1084 Generalized abdominal pain: Secondary | ICD-10-CM | POA: Diagnosis not present

## 2020-06-28 DIAGNOSIS — R634 Abnormal weight loss: Secondary | ICD-10-CM

## 2020-06-28 LAB — COMPREHENSIVE METABOLIC PANEL
ALT: 11 U/L (ref 0–53)
AST: 11 U/L (ref 0–37)
Albumin: 4.5 g/dL (ref 3.5–5.2)
Alkaline Phosphatase: 78 U/L (ref 39–117)
BUN: 42 mg/dL — ABNORMAL HIGH (ref 6–23)
CO2: 29 mEq/L (ref 19–32)
Calcium: 9.4 mg/dL (ref 8.4–10.5)
Chloride: 101 mEq/L (ref 96–112)
Creatinine, Ser: 1.72 mg/dL — ABNORMAL HIGH (ref 0.40–1.50)
GFR: 37.59 mL/min — ABNORMAL LOW (ref >60.00)
Glucose, Bld: 113 mg/dL — ABNORMAL HIGH (ref 70–99)
Potassium: 3.8 mEq/L (ref 3.5–5.1)
Sodium: 136 mEq/L (ref 135–145)
Total Bilirubin: 1.1 mg/dL (ref 0.2–1.2)
Total Protein: 6.6 g/dL (ref 6.0–8.3)

## 2020-06-28 LAB — LIPASE: Lipase: 15 U/L (ref 11.0–59.0)

## 2020-06-28 MED ORDER — ESOMEPRAZOLE MAGNESIUM 40 MG PO CPDR
DELAYED_RELEASE_CAPSULE | ORAL | 1 refills | Status: DC
Start: 2020-06-28 — End: 2020-06-29

## 2020-06-28 NOTE — Progress Notes (Signed)
History of Present Illness: This is an 75 year year old male referred by [name], MD for the evaluation of abdominal pain, weight loss over past 6 months. He is accompanied by his girlfriend. He related frequent mild generalized abdominal pain that wakes him at night. Has mild nausea and notes a 5 lb weight loss. He had a colonoscopy in 2011 by Dr. Cristina Gong for history of benign colon polyps. We do not have the colonoscopy or pathology report available. Cardioversion performed on 6/18. Eliquis started 2 months ago for afib. He noted mild constipation and smaller stool for past 2-3 days. CBC unremarkable. New renal insufficiency noted. Denies constipation, diarrhea, melena, hematochezia, vomiting, dysphagia, reflux symptoms, chest pain.    Allergies  Allergen Reactions  . Procardia [Nifedipine] Other (See Comments)    Headaches    Outpatient Medications Prior to Visit  Medication Sig Dispense Refill  . amLODipine (NORVASC) 5 MG tablet Take 5 mg by mouth Daily.     Marland Kitchen apixaban (ELIQUIS) 5 MG TABS tablet Take 1 tablet (5 mg total) by mouth 2 (two) times daily. 60 tablet 6  . ciclopirox (LOPROX) 0.77 % cream Apply 1 application topically daily.    Marland Kitchen ketotifen (ALAWAY) 0.025 % ophthalmic solution Place 1 drop into both eyes daily.    Marland Kitchen latanoprost (XALATAN) 0.005 % ophthalmic solution Place 1 drop into both eyes at bedtime.    . Naproxen Sodium (ALEVE) 220 MG CAPS Take 220 mg by mouth daily as needed (pain).    Marland Kitchen OVER THE COUNTER MEDICATION [place] sleeping aid: Take one at bedtime as needed    . pantoprazole (PROTONIX) 40 MG tablet Take 40 mg by mouth Daily.     [name] Glycol-Propyl Glycol (SYSTANE OP) Place 1 drop into both eyes daily as needed (dry eyes).    . terazosin (HYTRIN) 10 MG capsule Take 10 mg by mouth at bedtime.     . triamcinolone cream (KENALOG) 0.1 % Apply 1 application topically daily as needed (dry and itching skin).     Marland Kitchen doxylamine, Sleep, (UNISOM) 25 MG tablet  Take 25 mg by mouth at bedtime as needed for sleep.     No facility-administered medications prior to visit.   Past Medical History:  Diagnosis Date  . Acute, but ill-defined, cerebrovascular disease 04/07/2014  . Atrial fibrillation ([place])   . BPH (benign prostatic hyperplasia)   . Colon polyps   . COPD (chronic obstructive pulmonary disease) ([place])   . Eczema   . Erectile dysfunction   . Gallstones   . GERD (gastroesophageal reflux disease)   . Heart murmur   . HOH (hard of hearing)   . HTN (hypertension)   . Meningioma ([facility])    Right frontal  . Meralgia paraesthetica   . Palpitations   . Urinary retention    Past Surgical History:  Procedure Laterality Date  . BLADDER DIVERTICULECTOMY  02/15/2003   Archie Endo 05/16/2011  . CARDIAC CATHETERIZATION    . CARDIOVERSION N/A 06/17/2020   Procedure: CARDIOVERSION;  Surgeon: [name], MD;  Location: [facility] ENDOSCOPY;  Service: Cardiovascular;  Laterality: N/A;  . CATARACT EXTRACTION Bilateral 2013  . CHOLECYSTECTOMY N/A 11/19/2014   Procedure: LAPAROSCOPIC CHOLECYSTECTOMY WITH INTRAOPERATIVE CHOLANGIOGRAM;  Surgeon: [name], MD;  Location: [facility];  Service: General;  Laterality: N/A;  . COLONOSCOPY W/ POLYPECTOMY    . GUM SURGERY  1999  . INGUINAL HERNIA REPAIR Left   . INGUINAL HERNIA REPAIR Right 11/2009  . LAPAROSCOPIC CHOLECYSTECTOMY  11/19/2014   w/IOC  . MEATOTOMY  06/2003   Distal urethral meatotomy with YAG laser/notes 05/16/2011  . SUPRAPUBIC PROSTATECTOMY  02/15/2003   Archie Endo 05/16/2011   Social History   Socioeconomic History  . Marital status: Divorced    Spouse name: Not on file  . Number of children: 2  . Years of education: Ph. D  . Highest education level: Not on file  Occupational History  . Occupation: Retired-psychologist  Tobacco Use  . Smoking status: Former Smoker    Packs/day: 0.20    Years: 40.00    Pack years: 8.00    Types: Cigarettes    Quit date: 11/12/1999    Years since quitting: 20.6    . Smokeless tobacco: Never Used  Vaping Use  . Vaping Use: Never used  Substance and Sexual Activity  . Alcohol use: Yes    Alcohol/week: 14.0 standard drinks    Types: 14 Glasses of wine per week    Comment: occ  . Drug use: No  . Sexual activity: Not Currently  Other Topics Concern  . Not on file  Social History Narrative  . Not on file   Social Determinants of Health   Financial Resource Strain:   . Difficulty of Paying Living Expenses:   Food Insecurity:   . Worried About Charity fundraiser in the Last Year:   . Arboriculturist in the Last Year:   Transportation Needs:   . Film/video editor (Medical):   Marland Kitchen Lack of Transportation (Non-Medical):   Physical Activity:   . Days of Exercise per Week:   . Minutes of Exercise per Session:   Stress:   . Feeling of Stress :   Social Connections:   . Frequency of Communication with Friends and Family:   . Frequency of Social Gatherings with Friends and Family:   . Attends Religious Services:   . Active Member of Clubs or Organizations:   . Attends Archivist Meetings:   Marland Kitchen Marital Status:    Family History  Problem Relation Age of Onset  . Alzheimer's disease Mother   . Heart attack Father   . Cancer - Lung Sister       Review of Systems: Pertinent positive and negative review of systems were noted in the above HPI section. All other review of systems were otherwise negative.   Physical Exam: General: Well developed, well nourished, no acute distress Head: Normocephalic and atraumatic Eyes:  sclerae anicteric, EOMI Ears: Decreased auditory acuity Mouth: Not examined, mask on during Covid-19 pandemic Neck: Supple, no masses or thyromegaly Lungs: Clear throughout to auscultation Heart: Regular rate and rhythm; no murmurs, rubs or bruits Abdomen: Soft, non tender and non distended. No masses, hepatosplenomegaly or hernias noted. Normal Bowel sounds Rectal: Not done Musculoskeletal: Symmetrical with no  gross deformities  Skin: No lesions on visible extremities Pulses:  Normal pulses noted Extremities: No clubbing, cyanosis, edema or deformities noted Neurological: Alert oriented x 4, grossly nonfocal Cervical Nodes:  No significant cervical adenopathy Inguinal Nodes: No significant inguinal adenopathy Psychological:  Alert and cooperative. Normal mood and affect   Assessment and Recommendations:  1. Generalized abdominal pain at night, weight loss. R/O ulcer, occult neoplasm. CMP, lipase, Hemoccults and schedule CT AP with and without contrast. If Cr remained elevated CT AP will be without contrast. Start Nexium 40 mg po qd. Avoid ASA/NSAIDs. If CT AP is not diagnostic consider EGD, potentially while on Eliquis, or colonoscopy and EGD off Eliquis.  The risks (including bleeding, perforation, infection, missed lesions, medication reactions and possible hospitalization or surgery if complications occur), benefits, and alternatives to endoscopy with possible biopsy and possible dilation were discussed with the patient and they consent to proceed. The risks (including bleeding, perforation, infection, missed lesions, medication reactions and possible hospitalization or surgery if complications occur), benefits, and alternatives to colonoscopy with possible biopsy and possible polypectomy were discussed with the patient and they consent to proceed.   2. History of colon polyps. Request records from Dr. Osborn Coho office.   3. Afib anticoagulated on Eliquis. If endoscopic procedures needed will hold Eliquis 2 days before procedure - will instruct when and how to resume after procedure. Low but real risk of cardiovascular event such as heart attack, stroke, embolism, thrombosis or ischemia/infarct of other organs off Eliquis explained and need to seek urgent help if this occurs. The patient consents to proceed. Will communicate by phone or EMR with patient's prescribing provider to confirm that holding  Eliquis is reasonable in this case.     cc: [name], MD [address] [place],  [place] [address]

## 2020-06-28 NOTE — Patient Instructions (Addendum)
Your provider has requested that you go to the basement level for lab work before leaving today. Press "B" on the elevator. The lab is located at the first door on the left as you exit the elevator.  Follow the instructions on the Hemoccult cards and mail them back to [place] when you are finished or you may take them directly to the lab in the basement of the [place] building. We will call you with the results.   Stop taking pantoprazole. We have sent Nexium 40 mg daily to your pharmacy.   You have been scheduled for a CT scan of the abdomen and pelvis at [facility], 1st floor Radiology. You are scheduled on 07/07/20  at 8:00am. You should arrive 15 minutes prior to your appointment time for registration. Please follow the written instructions below on the day of your exam:    1) Do not eat anything after 4:00am (4 hours prior to your test)  2)  (At least 3 days prior to your procedure you will need to pick up) 2 bottles of oral contrast to drink.  The solution may taste better if refrigerated, but do NOT add ice or any other liquid to this solution. Shake well before drinking.   Drink 1 bottle of contrast @ 6:00am (2 hours prior to your exam)  Drink 1 bottle of contrast @ 7:00am (1 hour prior to your exam)   You may take any medications as prescribed with a small amount of water, if necessary. If you take any of the following medications: METFORMIN, GLUCOPHAGE, GLUCOVANCE, AVANDAMET, RIOMET, FORTAMET, [place] MET, JANUMET, GLUMETZA or METAGLIP, you MAY be asked to HOLD this medication 48 hours AFTER the exam.   The purpose of you drinking the oral contrast is to aid in the visualization of your intestinal tract. The contrast solution may cause some diarrhea. Depending on your individual set of symptoms, you may also receive an intravenous injection of x-ray contrast/dye. Plan on being at [facility] for 45 minutes or longer, depending on the type of exam you are having performed.   If you have any  questions regarding your exam or if you need to reschedule, you may call [facility] at [phone] between the hours of 8:00 am and 5:00 pm, Monday-Friday.   Thank you for choosing me and [facility].  Pricilla Riffle. Dagoberto Ligas., MD., Marval Regal

## 2020-06-29 ENCOUNTER — Other Ambulatory Visit: Payer: Self-pay

## 2020-06-29 DIAGNOSIS — R1084 Generalized abdominal pain: Secondary | ICD-10-CM

## 2020-07-05 ENCOUNTER — Other Ambulatory Visit (INDEPENDENT_AMBULATORY_CARE_PROVIDER_SITE_OTHER): Payer: Medicare Other

## 2020-07-05 DIAGNOSIS — R634 Abnormal weight loss: Secondary | ICD-10-CM | POA: Diagnosis not present

## 2020-07-05 DIAGNOSIS — R1084 Generalized abdominal pain: Secondary | ICD-10-CM | POA: Diagnosis not present

## 2020-07-05 LAB — HEMOCCULT SLIDES (X 3 CARDS)
Fecal Occult Blood: NEGATIVE
OCCULT 1: POSITIVE — AB
OCCULT 2: POSITIVE — AB
OCCULT 3: POSITIVE — AB
OCCULT 4: POSITIVE — AB
OCCULT 5: POSITIVE — AB

## 2020-07-07 ENCOUNTER — Ambulatory Visit (HOSPITAL_COMMUNITY): Payer: Medicare Other

## 2020-07-07 ENCOUNTER — Ambulatory Visit (HOSPITAL_COMMUNITY)
Admission: RE | Admit: 2020-07-07 | Discharge: 2020-07-07 | Disposition: A | Discharge status: Home / Self Care | Payer: Medicare Other | Source: Ambulatory Visit | Attending: Gastroenterology | Admitting: Gastroenterology

## 2020-07-07 ENCOUNTER — Other Ambulatory Visit: Payer: Self-pay

## 2020-07-07 DIAGNOSIS — R1084 Generalized abdominal pain: Secondary | ICD-10-CM | POA: Diagnosis not present

## 2020-07-20 ENCOUNTER — Ambulatory Visit: Payer: Medicare Other | Admitting: Cardiology

## 2020-07-22 DIAGNOSIS — K625 Hemorrhage of anus and rectum: Secondary | ICD-10-CM | POA: Diagnosis not present

## 2020-07-22 DIAGNOSIS — I10 Essential (primary) hypertension: Secondary | ICD-10-CM | POA: Diagnosis not present

## 2020-07-24 NOTE — Progress Notes (Signed)
CARDIOLOGY OFFICE NOTE  Date:  07/24/2020    Jared Neal, Jared Neal Date of Birth: Aug 14, 1929 Medical Record #[id number]  PCP:  [name], MD  Cardiologist:  [name]  No chief complaint on file.   History of Present Illness: Jared Neal, Jared Neal is a 86 y.o. male who presents today for f/u afib  He has had a history of long standing HTN Other issues include COPD, GERD, , and chronically abnormal EKG. Did not tolerate Procardia in the past. Past Holter with only PACs and PVCs from 2013. Remote echo from 2015.   Seen by NP 01/11/20   he was concerned about his BP. But  found him to be in AF He was started on eliquis . Had successful [facility] on 06/17/20.  Needs updated echo   Doing well with no bleeding issues other than easy bruising Needs f/u colonoscopy with DR Hassan Rowan to proceed He tends toward bradycardia with SSS but this should not be and issue for scope and  Will hold eliquis for 48 hours prior to procedure       Past Medical History:  Diagnosis Date  . Acute, but ill-defined, cerebrovascular disease 04/07/2014  . Adenomatous colon polyp   . Atrial fibrillation ([place])   . BPH (benign prostatic hyperplasia)   . COPD (chronic obstructive pulmonary disease) ([place])   . Eczema   . Erectile dysfunction   . Gallstones   . GERD (gastroesophageal reflux disease)   . Heart murmur   . HOH (hard of hearing)   . HTN (hypertension)   . Meningioma ([facility])    Right frontal  . Meralgia paraesthetica   . Palpitations   . Urinary retention     Past Surgical History:  Procedure Laterality Date  . BLADDER DIVERTICULECTOMY  02/15/2003   Archie Endo 05/16/2011  . CARDIAC CATHETERIZATION    . CARDIOVERSION N/A 06/17/2020   Procedure: CARDIOVERSION;  Surgeon: [name], MD;  Location: [facility] ENDOSCOPY;  Service: Cardiovascular;  Laterality: N/A;  . CATARACT EXTRACTION Bilateral 2013  . CHOLECYSTECTOMY N/A 11/19/2014   Procedure: LAPAROSCOPIC CHOLECYSTECTOMY WITH INTRAOPERATIVE  CHOLANGIOGRAM;  Surgeon: [name], MD;  Location: [facility];  Service: General;  Laterality: N/A;  . COLONOSCOPY W/ POLYPECTOMY    . GUM SURGERY  1999  . INGUINAL HERNIA REPAIR Left   . INGUINAL HERNIA REPAIR Right 11/2009  . LAPAROSCOPIC CHOLECYSTECTOMY  11/19/2014   w/IOC  . MEATOTOMY  06/2003   Distal urethral meatotomy with YAG laser/notes 05/16/2011  . SUPRAPUBIC PROSTATECTOMY  02/15/2003   Archie Endo 05/16/2011     Medications: No outpatient medications have been marked as taking for the 07/29/20 encounter (Appointment) with [name], MD.     Allergies: Allergies  Allergen Reactions  . Procardia [Nifedipine] Other (See Comments)    Headaches     Social History: The patient  reports that he quit smoking about 20 years ago. His smoking use included cigarettes. He has a 8.00 pack-year smoking history. He has never used smokeless tobacco. He reports current alcohol use of about 14.0 standard drinks of alcohol per week. He reports that he does not use drugs.   Family History: The patient's family history includes Alzheimer's disease in his mother; Cancer - Lung in his sister; Heart attack in his father.   Review of Systems: Please see the history of present illness.   All other systems are reviewed and negative.   Physical Exam: VS:  There were no vitals taken for this visit. Marland Kitchen  BMI There is no height or weight on file to calculate BMI.  Wt Readings from Last 3 Encounters:  06/28/20 150 lb (68 kg)  06/17/20 150 lb (68 kg)  05/20/20 160 lb (72.6 kg)   Affect appropriate Healthy:  appears stated age 53: normal Neck supple with no adenopathy JVP normal no bruits no thyromegaly Lungs clear with no wheezing and good diaphragmatic motion Heart:  S1/S2 no murmur, no rub, gallop or click PMI normal Abdomen: benighn, BS positve, no tenderness, no AAA no bruit.  No HSM or HJR Distal pulses intact with no bruits No edema Neuro non-focal Skin warm and dry No  muscular weakness    LABORATORY DATA:  EKG:   afib rate 65 chronic inferior lateral T wave inversions  02/15/20   Lab Results  Component Value Date   WBC 9.0 06/15/2020   HGB 16.6 06/15/2020   HCT 47.2 06/15/2020   PLT 177 06/15/2020   GLUCOSE 113 (H) 06/28/2020   ALT 11 06/28/2020   AST 11 06/28/2020   NA 136 06/28/2020   K 3.8 06/28/2020   CL 101 06/28/2020   CREATININE 1.72 (H) 06/28/2020   BUN 42 (H) 06/28/2020   CO2 29 06/28/2020   TSH 2.270 01/11/2020   INR 1.14 11/11/2014    Other Studies Reviewed Today:  Echo 1/15 Study Conclusions  - Left ventricle: The cavity size was normal. There was mild focal basal and moderate concentric hypertrophy of the septum without evidence of LVOT obstruction. Systolic function was normal. The estimated ejection fraction was in the range of 60% to 65%. Wall motion was normal; there were no regional wall motion abnormalities. Doppler parameters are consistent with abnormal left ventricular relaxation (grade 1 diastolic dysfunction). Doppler parameters are consistent with high ventricular filling pressure. - Mitral valve: Mild regurgitation. - Left atrium: The atrium was moderately dilated. - Right ventricle: The cavity size was moderately dilated. Wall thickness was normal. Systolic function was normal. - Right atrium: The atrium was mildly dilated. - Atrial septum: No defect or patent foramen ovale was identified. - Tricuspid valve: No regurgitation.    Assessment/Plan:  1. Persistent AF -  DCC done 06/17/20 with conversion continue eliquis HR;s in sinus tend to be low will update echo post [facility] for EF and atrial sizes  Clinically he is in NSR today with regular pulse Discussed his chronic bradycardia and SSS may need PPM in future. If he reverts to afib will discuss AAT further but may not attempt another cardioversion   2. HTN - Well controlled.  With weight loss currently off norvasc observe    3. Chronically abnormal EKG with lateral T wave changes - active with no angina observe  Given age   63. Preoperative:  Clear to have colonoscopy with Dr Fuller Plan hold eliquis 2 days prior to procedure    Current medicines are reviewed with the patient today.  The patient does not have concerns regarding medicines other than what has been noted above.  The following changes have been made:  See abov e.  Labs/ tests ordered today include:  Echo for PAF    No orders of the defined types were placed in this encounter.    Disposition:   FU in3 months if echo ok    Signed: Jenkins Rouge, MD  07/24/2020 3:28 PM  [facility] [address] [place], [place]  [address] Phone: [phone] Fax: [phone]

## 2020-07-29 ENCOUNTER — Other Ambulatory Visit: Payer: Self-pay

## 2020-07-29 ENCOUNTER — Ambulatory Visit (INDEPENDENT_AMBULATORY_CARE_PROVIDER_SITE_OTHER): Payer: Medicare Other | Admitting: Cardiovascular Disease

## 2020-07-29 ENCOUNTER — Encounter: Payer: Self-pay | Admitting: Cardiovascular Disease

## 2020-07-29 VITALS — BP 138/90 | HR 68 | Ht 71.0 in | Wt 151.0 lb

## 2020-07-29 DIAGNOSIS — I4891 Unspecified atrial fibrillation: Secondary | ICD-10-CM | POA: Diagnosis not present

## 2020-07-29 NOTE — Patient Instructions (Addendum)
Medication Instructions:  *If you need a refill on your cardiac medications before your next appointment, please call your pharmacy*  Lab Work: If you have labs (blood work) drawn today and your tests are completely normal, you will receive your results only by: Marland Kitchen MyChart Message (if you have MyChart) OR . A paper copy in the mail If you have any lab test that is abnormal or we need to change your treatment, we will call you to review the results.  Testing/Procedures: Your physician has requested that you have an echocardiogram. Echocardiography is a painless test that uses sound waves to create images of your heart. It provides your doctor with information about the size and shape of your heart and how well your heart's chambers and valves are working. This procedure takes approximately one hour. There are no restrictions for this procedure.  Follow-Up: At [facility], you and your health needs are our priority.  As part of our continuing mission to provide you with exceptional heart care, we have created designated Provider Care Teams.  These Care Teams include your primary Cardiologist (physician) and Advanced Practice Providers (APPs -  Physician Assistants and Nurse Practitioners) who all work together to provide you with the care you need, when you need it.  We recommend signing up for the patient portal called "MyChart".  Sign up information is provided on this After Visit Summary.  MyChart is used to connect with patients for Virtual Visits (Telemedicine).  Patients are able to view lab/test results, encounter notes, upcoming appointments, etc.  Non-urgent messages can be sent to your provider as well.   To learn more about what you can do with MyChart, go to [email or web address].    Your next appointment:   3 month(s)  The format for your next appointment:   In Person  Provider:   You may see Dr. Johnsie Cancel or one of the following Advanced Practice Providers on your designated  Care Team:    Truitt Merle, NP  Cecilie Kicks, NP  Kathyrn Drown, NP

## 2020-08-05 DIAGNOSIS — I10 Essential (primary) hypertension: Secondary | ICD-10-CM | POA: Diagnosis not present

## 2020-08-10 DIAGNOSIS — J449 Chronic obstructive pulmonary disease, unspecified: Secondary | ICD-10-CM | POA: Diagnosis not present

## 2020-08-10 DIAGNOSIS — H9193 Unspecified hearing loss, bilateral: Secondary | ICD-10-CM | POA: Diagnosis not present

## 2020-08-10 DIAGNOSIS — I10 Essential (primary) hypertension: Secondary | ICD-10-CM | POA: Diagnosis not present

## 2020-08-10 DIAGNOSIS — N401 Enlarged prostate with lower urinary tract symptoms: Secondary | ICD-10-CM | POA: Diagnosis not present

## 2020-08-10 DIAGNOSIS — K219 Gastro-esophageal reflux disease without esophagitis: Secondary | ICD-10-CM | POA: Diagnosis not present

## 2020-08-10 DIAGNOSIS — Z0000 Encounter for general adult medical examination without abnormal findings: Secondary | ICD-10-CM | POA: Diagnosis not present

## 2020-08-13 DIAGNOSIS — Z20828 Contact with and (suspected) exposure to other viral communicable diseases: Secondary | ICD-10-CM | POA: Diagnosis not present

## 2020-08-18 ENCOUNTER — Other Ambulatory Visit: Payer: Self-pay

## 2020-08-18 ENCOUNTER — Ambulatory Visit (HOSPITAL_COMMUNITY): Payer: Medicare Other | Attending: Cardiovascular Disease

## 2020-08-18 DIAGNOSIS — I4891 Unspecified atrial fibrillation: Secondary | ICD-10-CM | POA: Insufficient documentation

## 2020-08-18 LAB — ECHOCARDIOGRAM COMPLETE
Area-P 1/2: 3.31 cm2
S' Lateral: 2.4 cm

## 2020-08-18 MED ORDER — PERFLUTREN LIPID MICROSPHERE
1.0000 mL | INTRAVENOUS | Status: AC | PRN
  Administered 2020-08-18: 2 mL via INTRAVENOUS

## 2020-08-19 ENCOUNTER — Telehealth: Payer: Self-pay

## 2020-08-19 NOTE — Telephone Encounter (Signed)
-----   Message from Ladene Artist, MD sent at 08/18/2020  5:06 PM EDT ----- See 06/28/2020 office note for colonoscopy/egd ----- Message ----- From: Michaelyn Barter, RN Sent: 08/18/2020   8:19 AM EDT To: Ladene Artist, MD

## 2020-08-19 NOTE — Telephone Encounter (Signed)
Patient scheduled his EGD/Colon on 10/10/20 and pre-visit for 09/30/20. Patient reported he has been covid vaccinated.

## 2020-08-19 NOTE — Telephone Encounter (Signed)
Patient had stated previously on 07/08/20 that he wanted to think about scheduling the procedure and will call our office back. Will reach out again to patient since seeing his Cardiologist. Left message for patient to return my call to schedule ECL.

## 2020-08-22 ENCOUNTER — Other Ambulatory Visit: Payer: Self-pay | Admitting: Gastroenterology

## 2020-08-27 ENCOUNTER — Other Ambulatory Visit: Payer: Self-pay | Admitting: Nurse Practitioner

## 2020-08-27 DIAGNOSIS — R9431 Abnormal electrocardiogram [ECG] [EKG]: Secondary | ICD-10-CM

## 2020-08-27 DIAGNOSIS — I10 Essential (primary) hypertension: Secondary | ICD-10-CM

## 2020-08-29 NOTE — Telephone Encounter (Signed)
Eliquis 5mg  refill request received. Patient is 86 years old, weight-68.5kg on 07/29/2020, Crea-1.72 on 06/28/2020, Diagnosis-Afib, and last seen by Dr. Johnsie Cancel on 07/29/2020. Dose is inappropriate based on dosing criteria. Message sent to Dr. Johnsie Cancel regarding doseage and creatinine trends for doseage recommendation.

## 2020-08-30 NOTE — Telephone Encounter (Signed)
Per Dr. Johnsie Cancel- decrease dose to 2.5mg  BID. Patient called and made aware of dose change.

## 2020-09-23 ENCOUNTER — Other Ambulatory Visit: Payer: Medicare Other | Admitting: *Deleted

## 2020-09-23 ENCOUNTER — Other Ambulatory Visit: Payer: Self-pay

## 2020-09-23 DIAGNOSIS — I4891 Unspecified atrial fibrillation: Secondary | ICD-10-CM | POA: Diagnosis not present

## 2020-09-23 DIAGNOSIS — Z79899 Other long term (current) drug therapy: Secondary | ICD-10-CM | POA: Diagnosis not present

## 2020-09-23 LAB — BASIC METABOLIC PANEL
BUN/Creatinine Ratio: 27 — ABNORMAL HIGH (ref 10–24)
BUN: 33 mg/dL — ABNORMAL HIGH (ref 8–27)
CO2: 24 mmol/L (ref 20–29)
Calcium: 9 mg/dL (ref 8.6–10.2)
Chloride: 105 mmol/L (ref 96–106)
Creatinine, Ser: 1.24 mg/dL (ref 0.76–1.27)
GFR calc Af Amer: 59 mL/min/{1.73_m2} — ABNORMAL LOW (ref >59)
GFR calc non Af Amer: 51 mL/min/{1.73_m2} — ABNORMAL LOW (ref >59)
Glucose: 149 mg/dL — ABNORMAL HIGH (ref 65–99)
Potassium: 3.8 mmol/L (ref 3.5–5.2)
Sodium: 141 mmol/L (ref 134–144)

## 2020-09-23 LAB — CBC
Hematocrit: 43.1 % (ref 37.5–51.0)
Hemoglobin: 15 g/dL (ref 13.0–17.7)
MCH: 29.9 pg (ref 26.6–33.0)
MCHC: 34.8 g/dL (ref 31.5–35.7)
MCV: 86 fL (ref 79–97)
Platelets: 145 10*3/uL — ABNORMAL LOW (ref 150–450)
RBC: 5.02 x10E6/uL (ref 4.14–5.80)
RDW: 13.1 % (ref 11.6–15.4)
WBC: 6.3 10*3/uL (ref 3.4–10.8)

## 2020-09-30 ENCOUNTER — Ambulatory Visit (AMBULATORY_SURGERY_CENTER): Payer: Self-pay | Admitting: *Deleted

## 2020-09-30 ENCOUNTER — Other Ambulatory Visit: Payer: Self-pay

## 2020-09-30 VITALS — Ht 71.0 in | Wt 156.0 lb

## 2020-09-30 DIAGNOSIS — R1084 Generalized abdominal pain: Secondary | ICD-10-CM

## 2020-09-30 DIAGNOSIS — Z860100 Personal history of colon polyps, unspecified: Secondary | ICD-10-CM

## 2020-09-30 DIAGNOSIS — R634 Abnormal weight loss: Secondary | ICD-10-CM

## 2020-09-30 DIAGNOSIS — Z23 Encounter for immunization: Secondary | ICD-10-CM | POA: Diagnosis not present

## 2020-09-30 MED ORDER — PLENVU 140 G PO SOLR: 1.0000 | ORAL | 0 refills | Status: DC

## 2020-09-30 NOTE — Progress Notes (Signed)
cov vax x 2  No egg or soy allergy known to patient  No issues with past sedation with any surgeries or procedures no intubation problems in the past  No FH of Malignant Hyperthermia No diet pills per patient No home 02 use per patient  No blood thinners per patient  Pt denies issues with constipation  No A fib or A flutter  EMMI video to pt or via [place] 19 guidelines implemented in [place] today with Pt and RN   Plenvu Medicare Coupon  Coupon given to pt in [place] today , Code to Pharmacy - gave pt the option of Golytely or Plenvu- chose Plenvu - pt and driver aware of activating coupon prior to picking up at pharmacy   Due to the COVID-19 pandemic we are asking patients to follow these guidelines. Please only bring one care partner. Please be aware that your care partner may wait in the car in the parking lot or if they feel like they will be too hot to wait in the car, they may wait in the lobby on the 4th floor. All care partners are required to wear a mask the entire time (we do not have any that we can provide them), they need to practice social distancing, and we will do a Covid check for all patient's and care partners when you arrive. Also we will check their temperature and your temperature. If the care partner waits in their car they need to stay in the parking lot the entire time and we will call them on their cell phone when the patient is ready for discharge so they can bring the car to the front of the building. Also all patient's will need to wear a mask into building.

## 2020-10-05 ENCOUNTER — Encounter: Payer: Medicare Other | Admitting: Gastroenterology

## 2020-10-06 ENCOUNTER — Other Ambulatory Visit: Payer: Self-pay | Admitting: Gastroenterology

## 2020-10-10 ENCOUNTER — Other Ambulatory Visit: Payer: Self-pay

## 2020-10-10 ENCOUNTER — Encounter: Payer: Self-pay | Admitting: Gastroenterology

## 2020-10-10 ENCOUNTER — Ambulatory Visit (AMBULATORY_SURGERY_CENTER): Payer: Medicare Other | Admitting: Gastroenterology

## 2020-10-10 VITALS — BP 148/62 | HR 53 | Temp 97.3°F | Resp 22 | Ht 71.0 in | Wt 156.0 lb

## 2020-10-10 DIAGNOSIS — R634 Abnormal weight loss: Secondary | ICD-10-CM

## 2020-10-10 DIAGNOSIS — K3189 Other diseases of stomach and duodenum: Secondary | ICD-10-CM

## 2020-10-10 DIAGNOSIS — I4891 Unspecified atrial fibrillation: Secondary | ICD-10-CM | POA: Diagnosis not present

## 2020-10-10 DIAGNOSIS — K449 Diaphragmatic hernia without obstruction or gangrene: Secondary | ICD-10-CM | POA: Diagnosis not present

## 2020-10-10 DIAGNOSIS — D120 Benign neoplasm of cecum: Secondary | ICD-10-CM

## 2020-10-10 DIAGNOSIS — J449 Chronic obstructive pulmonary disease, unspecified: Secondary | ICD-10-CM | POA: Diagnosis not present

## 2020-10-10 DIAGNOSIS — D123 Benign neoplasm of transverse colon: Secondary | ICD-10-CM | POA: Diagnosis not present

## 2020-10-10 DIAGNOSIS — K222 Esophageal obstruction: Secondary | ICD-10-CM

## 2020-10-10 DIAGNOSIS — Z86010 Personal history of colonic polyps: Secondary | ICD-10-CM

## 2020-10-10 DIAGNOSIS — R195 Other fecal abnormalities: Secondary | ICD-10-CM | POA: Diagnosis not present

## 2020-10-10 DIAGNOSIS — K2970 Gastritis, unspecified, without bleeding: Secondary | ICD-10-CM

## 2020-10-10 DIAGNOSIS — D122 Benign neoplasm of ascending colon: Secondary | ICD-10-CM

## 2020-10-10 DIAGNOSIS — K2951 Unspecified chronic gastritis with bleeding: Secondary | ICD-10-CM | POA: Diagnosis not present

## 2020-10-10 DIAGNOSIS — R109 Unspecified abdominal pain: Secondary | ICD-10-CM | POA: Diagnosis not present

## 2020-10-10 DIAGNOSIS — R1084 Generalized abdominal pain: Secondary | ICD-10-CM

## 2020-10-10 DIAGNOSIS — I10 Essential (primary) hypertension: Secondary | ICD-10-CM | POA: Diagnosis not present

## 2020-10-10 MED ORDER — DICYCLOMINE HCL 10 MG PO CAPS: 10.0000 mg | ORAL_CAPSULE | Freq: Three times a day (TID) | ORAL | 3 refills | Status: DC

## 2020-10-10 MED ORDER — SODIUM CHLORIDE 0.9 % IV SOLN: 500.0000 mL | Freq: Once | INTRAVENOUS | Status: DC

## 2020-10-10 NOTE — Progress Notes (Signed)
Pt. Reports no change in his medical or surgical history since his pre-visit 09/30/2020.

## 2020-10-10 NOTE — Op Note (Addendum)
[place] Patient Name: Jared Neal Procedure Date: 10/10/2020 2:59 PM MRN: [id number] Endoscopist: Ladene Artist , MD Age: 86 Referring MD:  Date of Birth: 22-Aug-1929 Gender: Male Account #: [id number] Procedure:                Upper GI endoscopy Indications:              Generalized abdominal pain, Heme positive stool,                            Weight loss Medicines:                Monitored Anesthesia Care Procedure:                Pre-Anesthesia Assessment:                           - Prior to the procedure, a History and Physical                            was performed, and patient medications and                            allergies were reviewed. The patient's tolerance of                            previous anesthesia was also reviewed. The risks                            and benefits of the procedure and the sedation                            options and risks were discussed with the patient.                            All questions were answered, and informed consent                            was obtained. Prior Anticoagulants: The patient has                            taken Eliquis (apixaban), last dose was 2 days                            prior to procedure. ASA Grade Assessment: III - A                            patient with severe systemic disease. After                            reviewing the risks and benefits, the patient was                            deemed in satisfactory condition to undergo the  procedure.                           After obtaining informed consent, the endoscope was                            passed under direct vision. Throughout the                            procedure, the patient's blood pressure, pulse, and                            oxygen saturations were monitored continuously. The                            Endoscope was introduced through the mouth, and                             advanced to the third part of duodenum. The upper                            GI endoscopy was accomplished without difficulty.                            The patient tolerated the procedure well. Scope In: Scope Out: Findings:                 One benign-appearing, intrinsic mild stenosis was                            found 37 cm from the incisors. This stenosis                            measured 1.4 cm (inner diameter) x less than one cm                            (in length). The stenosis was traversed.                           The exam of the esophagus was otherwise normal.                           A large hiatal hernia, 7 cm, was present.                           Diffuse atrophic mucosa was found in the gastric                            fundus and in the gastric body. Biopsies were taken                            with a cold forceps for histology.  The exam of the stomach was otherwise normal.                           Two 3-4 mm angiodysplastic lesions without bleeding                            were found in the second portion of the duodenum.                           The exam of the duodenum was otherwise normal. Complications:            No immediate complications. Estimated Blood Loss:     Estimated blood loss was minimal. Impression:               - Benign-appearing esophageal stenosis.                           - Large hiatal hernia.                           - Gastric mucosal atrophy. Biopsied.                           - Two non-bleeding angiodysplastic lesions in the                            duodenum. Recommendation:           - Patient has a contact number available for                            emergencies. The signs and symptoms of potential                            delayed complications were discussed with the                            patient. Return to normal activities tomorrow.                            Written discharge  instructions were provided to the                            patient.                           - Resume previous diet.                           - Antireflux measures long term.                           - Continue present medications including Nexium 40                            mg po qam.                           -  Resume Eliquis (apixaban) at prior dose in 3                            days. Refer to managing physician for further                            adjustment of therapy.                           - Dicyclomine 10 mg po tid prn abdominal pain, #60,                            3 refills.                           - Await pathology results. Ladene Artist, MD 10/10/2020 4:10:34 PM This report has been signed electronically.

## 2020-10-10 NOTE — Progress Notes (Signed)
Report given to PACU, vss 

## 2020-10-10 NOTE — Op Note (Signed)
[place] Patient Name: Jared Neal Procedure Date: 10/10/2020 2:59 PM MRN: [id number] Endoscopist: Ladene Artist , MD Age: 86 Referring MD:  Date of Birth: May 27, 1929 Gender: Male Account #: [id number] Procedure:                Colonoscopy Indications:              Heme positive stool, Weight loss, Personal history                            of colon polyps type unknown. Medicines:                Monitored Anesthesia Care Procedure:                Pre-Anesthesia Assessment:                           - Prior to the procedure, a History and Physical                            was performed, and patient medications and                            allergies were reviewed. The patient's tolerance of                            previous anesthesia was also reviewed. The risks                            and benefits of the procedure and the sedation                            options and risks were discussed with the patient.                            All questions were answered, and informed consent                            was obtained. Prior Anticoagulants: The patient has                            taken Eliquis (apixaban), last dose was 2 days                            prior to procedure. ASA Grade Assessment: III - A                            patient with severe systemic disease. After                            reviewing the risks and benefits, the patient was                            deemed in satisfactory condition to undergo the  procedure.                           After obtaining informed consent, the colonoscope                            was passed under direct vision. Throughout the                            procedure, the patient's blood pressure, pulse, and                            oxygen saturations were monitored continuously. The                            Colonoscope was introduced through the anus and                             advanced to the the cecum, identified by                            appendiceal orifice and ileocecal valve. The                            ileocecal valve, appendiceal orifice, and rectum                            were photographed. The quality of the bowel                            preparation was good. The colonoscopy was performed                            without difficulty. The patient tolerated the                            procedure well. Scope In: 3:08:39 PM Scope Out: 3:44:48 PM Scope Withdrawal Time: 0 hours 33 minutes 3 seconds  Total Procedure Duration: 0 hours 36 minutes 9 seconds  Findings:                 The perianal and digital rectal examinations were                            normal.                           - Single diverticulum in the ascending colon.                           A 25 mm polyp was found in the cecum. The polyp was                            sessile. The polyp was removed with a hot snare.  Resection and retrieval were complete.                           Two sessile polyps were found in the ascending                            colon. The polyps were 7 mm in size. These polyps                            were removed with a cold snare and had oozing so a                            hot snare was used to cauterize the site with no                            bleeding at end of procedure. Resection and                            retrieval were complete.                           A 12 mm polyp was found in the proximal transverse                            colon. The polyp was sessile. The polyp was removed                            with a hot snare. Resection and retrieval were                            complete.                           Two sessile polyps were found in the distal                            transverse colon. The polyps were 15 mm in size.                            These polyps were removed with a  hot snare.                            Resection and retrieval were complete.                           Multiple medium-mouthed diverticula were found in                            the left colon. There was no evidence of                            diverticular bleeding.  The exam was otherwise without abnormality on                            direct and retroflexion views.                           Internal hemorrhoids were found during                            retroflexion. The hemorrhoids were small and Grade                            I (internal hemorrhoids that do not prolapse). Complications:            No immediate complications. Estimated blood loss:                            None. Estimated Blood Loss:     Estimated blood loss: none. Impression:               - Single diverticulum in the ascending colon.                           - One 25 mm polyp in the cecum, removed with a hot                            snare. Resected and retrieved.                           - Two 7 mm polyps in the ascending colon, removed                            with a hot snare. Resected and retrieved.                           - One 12 mm polyp in the proximal transverse colon,                            removed with a hot snare. Resected and retrieved.                           - Two 15 mm polyps in the distal transverse colon,                            removed with a hot snare. Resected and retrieved.                           - Moderate diverticulosis in the left colon.                           - The examination was otherwise normal on direct                            and retroflexion views.                           -  Internal hemorrhoids. Recommendation:           - Repeat colonoscopy after studies are complete for                            surveillance based on pathology results.                           - Resume Eliquis (apixaban) in 3 days at prior                             dose. Refer to managing physician for further                            adjustment of therapy.                           - Patient has a contact number available for                            emergencies. The signs and symptoms of potential                            delayed complications were discussed with the                            patient. Return to normal activities tomorrow.                            Written discharge instructions were provided to the                            patient.                           - High fiber diet.                           - Continue present medications.                           - Await pathology results.                           - No aspirin, ibuprofen, naproxen, or other                            non-steroidal anti-inflammatory drugs for 2 weeks                            after polyp removal. Ladene Artist, MD 10/10/2020 4:04:06 PM This report has been signed electronically.

## 2020-10-10 NOTE — Progress Notes (Signed)
Called to room to assist during endoscopic procedure.  Patient ID and intended procedure confirmed with present staff. Received instructions for my participation in the procedure from the performing physician.  

## 2020-10-10 NOTE — Patient Instructions (Signed)
YOU HAD AN ENDOSCOPIC PROCEDURE TODAY AT THE [facility]:   Refer to the procedure report that was given to you for any specific questions about what was found during the examination.  If the procedure report does not answer your questions, please call your gastroenterologist to clarify.  If you requested that your care partner not be given the details of your procedure findings, then the procedure report has been included in a sealed envelope for you to review at your convenience later.  YOU SHOULD EXPECT: Some feelings of bloating in the abdomen. Passage of more gas than usual.  Walking can help get rid of the air that was put into your GI tract during the procedure and reduce the bloating. If you had a lower endoscopy (such as a colonoscopy or flexible sigmoidoscopy) you may notice spotting of blood in your stool or on the toilet paper. If you underwent a bowel prep for your procedure, you may not have a normal bowel movement for a few days.  Please Note:  You might notice some irritation and congestion in your nose or some drainage.  This is from the oxygen used during your procedure.  There is no need for concern and it should clear up in a day or so.  SYMPTOMS TO REPORT IMMEDIATELY:   Following lower endoscopy (colonoscopy or flexible sigmoidoscopy):  Excessive amounts of blood in the stool  Significant tenderness or worsening of abdominal pains  Swelling of the abdomen that is new, acute  Fever of 100F or higher   Following upper endoscopy (EGD)  Vomiting of blood or coffee ground material  New chest pain or pain under the shoulder blades  Painful or persistently difficult swallowing  New shortness of breath  Fever of 100F or higher  Black, tarry-looking stools  For urgent or emergent issues, a gastroenterologist can be reached at any hour by calling [phone]. Do not use MyChart messaging for urgent concerns.    DIET:  We do recommend a small meal at first, but  then you may proceed to your regular diet.  Drink plenty of fluids but you should avoid alcoholic beverages for 24 hours.  ACTIVITY:  You should plan to take it easy for the rest of today and you should NOT DRIVE or use heavy machinery until tomorrow (because of the sedation medicines used during the test).    FOLLOW UP: Our staff will call the number listed on your records 48-72 hours following your procedure to check on you and address any questions or concerns that you may have regarding the information given to you following your procedure. If we do not reach you, we will leave a message.  We will attempt to reach you two times.  During this call, we will ask if you have developed any symptoms of COVID 19. If you develop any symptoms (ie: fever, flu-like symptoms, shortness of breath, cough etc.) before then, please call [phone].  If you test positive for Covid 19 in the 2 weeks post procedure, please call and report this information to us.    If any biopsies were taken you will be contacted by phone or by letter within the next 1-3 weeks.  Please call us at [phone] if you have not heard about the biopsies in 3 weeks.    SIGNATURES/CONFIDENTIALITY: You and/or your care partner have signed paperwork which will be entered into your electronic medical record.  These signatures attest to the fact that that the information above on   your After Visit Summary has been reviewed and is understood.  Full responsibility of the confidentiality of this discharge information lies with you and/or your care-partner. 

## 2020-10-12 ENCOUNTER — Telehealth: Payer: Self-pay | Admitting: *Deleted

## 2020-10-12 NOTE — Telephone Encounter (Signed)
Second follow up call made, no opportunity to leave a message.

## 2020-10-13 NOTE — Telephone Encounter (Signed)
Pt's spouse is requesting a call back regarding pt's results.

## 2020-10-13 NOTE — Telephone Encounter (Signed)
Patient notified that the pathology has not resulted yet.

## 2020-10-17 ENCOUNTER — Encounter: Payer: Self-pay | Admitting: Gastroenterology

## 2020-10-19 DIAGNOSIS — Z23 Encounter for immunization: Secondary | ICD-10-CM | POA: Diagnosis not present

## 2020-10-31 NOTE — Progress Notes (Signed)
CARDIOLOGY OFFICE NOTE  Date:  11/07/2020    [name], Jared Neal Date of Birth: 1929/10/20 Medical Record #[id number]  PCP:  [name], MD  Cardiologist:  [name]  No chief complaint on file.   History of Present Illness: Jared Harshbarger, Jared Neal is a 86 y.o. male who presents today for f/u afib  He has had a history of long standing HTN Other issues include COPD, GERD, , and chronically abnormal EKG. Did not tolerate Procardia in the past. Past Holter with only PACs and PVCs from 2013. Remote echo from 2015.   Seen by NP 01/11/20   he was concerned about his BP. But  found him to be in AF He was started on eliquis . Had successful [facility] on 06/17/20. Tends to be bradycardic with HR;s in mid 50's when in sinus and is currently not on AV nodal drug or beta blocker     Held his DOAC for colonoscopy and EGD with Dr Fuller Plan 10/10/20 and had multiple polyps removed    Had COVID vaccine in October   Feels well Has more energy    Past Medical History:  Diagnosis Date  . Acute, but ill-defined, cerebrovascular disease 04/07/2014  . Adenomatous colon polyp   . Allergy   . Atrial fibrillation ([place])   . BPH (benign prostatic hyperplasia)   . Cataract    removed both eyes   . COPD (chronic obstructive pulmonary disease) ([place])   . Eczema   . Erectile dysfunction   . Gallstones   . GERD (gastroesophageal reflux disease)   . Heart murmur   . HOH (hard of hearing)   . HTN (hypertension)   . Meningioma ([facility])    Right frontal  . Meralgia paraesthetica   . Palpitations   . Urinary retention     Past Surgical History:  Procedure Laterality Date  . BLADDER DIVERTICULECTOMY  02/15/2003   Archie Endo 05/16/2011  . CARDIAC CATHETERIZATION    . CARDIOVERSION N/A 06/17/2020   Procedure: CARDIOVERSION;  Surgeon: [name], MD;  Location: [facility] END[facility]COPY;  Service: Cardiovascular;  Laterality: N/A;  . CATARACT EXTRACTION Bilateral 2013  . CHOLECYSTECTOMY N/A 11/19/2014    Procedure: LAPAROSCOPIC CHOLECYSTECTOMY WITH INTRAOPERATIVE CHOLANGIOGRAM;  Surgeon: [name], MD;  Location: [facility];  Service: General;  Laterality: N/A;  . COLONOSCOPY    . COLONOSCOPY W/ POLYPECTOMY    . GUM SURGERY  1999  . INGUINAL HERNIA REPAIR Left   . INGUINAL HERNIA REPAIR Right 11/2009  . LAPAROSCOPIC CHOLECYSTECTOMY  11/19/2014   w/IOC  . MEATOTOMY  06/2003   Distal urethral meatotomy with YAG laser/notes 05/16/2011  . POLYPECTOMY    . SUPRAPUBIC PROSTATECTOMY  02/15/2003   Archie Endo 05/16/2011     Medications: Current Meds  Medication Sig  . amLODipine (NORVASC) 5 MG tablet Take 5 mg by mouth daily.  Marland Kitchen apixaban (ELIQUIS) 2.5 MG TABS tablet Take 1 tablet (2.5 mg total) by mouth 2 (two) times daily.  . ciclopirox (LOPROX) 0.77 % cream Apply 1 application topically daily.  Marland Kitchen ketotifen (ALAWAY) 0.025 % ophthalmic solution Place 1 drop into both eyes daily.  Marland Kitchen latanoprost (XALATAN) 0.005 % ophthalmic solution Place 1 drop into both eyes at bedtime.  . Naproxen Sodium (ALEVE) 220 MG CAPS Take 220 mg by mouth daily as needed (pain).  Marland Kitchen OVER THE COUNTER MEDICATION [place] sleeping aid: Take one at bedtime as needed  . pantoprazole (PROTONIX) 40 MG tablet Take 40 mg by mouth daily.  Marland Kitchen  Polyethyl Glycol-Propyl Glycol (SYSTANE OP) Place 1 drop into both eyes daily as needed (dry eyes).  . terazosin (HYTRIN) 10 MG capsule Take 10 mg by mouth at bedtime.   . triamcinolone cream (KENALOG) 0.1 % Apply 1 application topically daily as needed (dry and itching skin).      Allergies: Allergies  Allergen Reactions  . Aspirin     GI Bleeding  . Procardia [Nifedipine] Other (See Comments)    Headaches     Social History: The patient  reports that he quit smoking about 21 years ago. His smoking use included cigarettes. He has a 8.00 pack-year smoking history. He has never used smokeless tobacco. He reports current alcohol use of about 14.0 standard drinks of alcohol per week. He reports  that he does not use drugs.   Family History: The patient's family history includes Alzheimer's disease in his mother; Cancer - Lung in his sister; Heart attack in his father.   Review of Systems: Please see the history of present illness.   All other systems are reviewed and negative.   Physical Exam: VS:  BP 118/60   Pulse 85   Ht 5\' 11"  (1.803 m)   Wt 70.1 kg   SpO2 99%   BMI 21.56 kg/m  .  BMI Body mass index is 21.56 kg/m.  Wt Readings from Last 3 Encounters:  11/07/20 70.1 kg  10/10/20 70.8 kg  09/30/20 70.8 kg   Affect appropriate Healthy:  appears stated age 40: normal Neck supple with no adenopathy JVP normal no bruits no thyromegaly Lungs clear with no wheezing and good diaphragmatic motion Heart:  S1/S2 no murmur, no rub, gallop or click PMI normal Abdomen: benighn, BS positve, no tenderness, no AAA no bruit.  No HSM or HJR Distal pulses intact with no bruits No edema Neuro non-focal Skin warm and dry No muscular weakness    LABORATORY DATA:  EKG:   afib rate 65 chronic inferior lateral T wave inversions  02/15/20   Lab Results  Component Value Date   WBC 6.3 09/23/2020   HGB 15.0 09/23/2020   HCT 43.1 09/23/2020   PLT 145 (L) 09/23/2020   GLUCOSE 149 (H) 09/23/2020   ALT 11 06/28/2020   AST 11 06/28/2020   NA 141 09/23/2020   K 3.8 09/23/2020   CL 105 09/23/2020   CREATININE 1.24 09/23/2020   BUN 33 (H) 09/23/2020   CO2 24 09/23/2020   TSH 2.270 01/11/2020   INR 1.14 11/11/2014    Other Studies Reviewed Today:  Echo 08/18/20   1. Left ventricular ejection fraction, by estimation, is 65 to 70%. The  left ventricle has normal function. The left ventricle has no regional  wall motion abnormalities. There is moderate variable left ventricular  hypertrophy of the basal-septal  segment. Left ventricular diastolic parameters are consistent with  age-related delayed relaxation (normal).  2. Right ventricular systolic function is normal.  The right ventricular  size is normal. Tricuspid regurgitation signal is inadequate for assessing  PA pressure.  3. Left atrial size was mildly dilated.  4. Right atrial size was mildly dilated.  5. The mitral valve is normal in structure. Mild mitral valve  regurgitation. No evidence of mitral stenosis.  6. The aortic valve is tricuspid. Aortic valve regurgitation is not  visualized. Mild aortic valve sclerosis is present, with no evidence of  aortic valve stenosis.  7. Aortic dilatation noted. There is borderline dilatation of the  ascending aorta measuring 38 mm.  8.  The inferior vena cava is normal in size with greater than 50%  respiratory variability, suggesting right atrial pressure of 3 mmHg.    Assessment/Plan:  1. Persistent AF -  DCC done 06/17/20 with conversion continue eliquis HR;s in sinus tend to be low not on AV nodal drugs. No bleeding issues. Echo 08/18/20 with EF 65-70% mild biatrial enlargement and only mild MR   2. HTN - Well controlled.  With weight loss currently off norvasc observe   3. Chronically abnormal EKG with lateral T wave changes - active with no angina observe  Given age   25. GI:  F/u pathology with Dr Fuller Plan regarding polyps    Current medicines are reviewed with the patient today.  The patient does not have concerns regarding medicines other than what has been noted above.  The following changes have been made:  See abov e.  Labs/ tests ordered today include: None    No orders of the defined types were placed in this encounter.    Disposition:   FU in 6 months   Signed: Jenkins Rouge, MD  11/07/2020 8:54 AM  [facility] [address] [place], [place]  [address] Phone: [phone] Fax: [phone]

## 2020-11-07 ENCOUNTER — Other Ambulatory Visit: Payer: Self-pay

## 2020-11-07 ENCOUNTER — Ambulatory Visit (INDEPENDENT_AMBULATORY_CARE_PROVIDER_SITE_OTHER): Payer: Medicare Other | Admitting: Cardiovascular Disease

## 2020-11-07 ENCOUNTER — Encounter: Payer: Self-pay | Admitting: Cardiovascular Disease

## 2020-11-07 VITALS — BP 118/60 | HR 85 | Ht 71.0 in | Wt 154.6 lb

## 2020-11-07 DIAGNOSIS — I480 Paroxysmal atrial fibrillation: Secondary | ICD-10-CM

## 2020-11-07 NOTE — Patient Instructions (Signed)

## 2020-11-14 DIAGNOSIS — H0102B Squamous blepharitis left eye, upper and lower eyelids: Secondary | ICD-10-CM | POA: Diagnosis not present

## 2020-11-14 DIAGNOSIS — H0102A Squamous blepharitis right eye, upper and lower eyelids: Secondary | ICD-10-CM | POA: Diagnosis not present

## 2020-11-14 DIAGNOSIS — H401131 Primary open-angle glaucoma, bilateral, mild stage: Secondary | ICD-10-CM | POA: Diagnosis not present

## 2021-01-10 DIAGNOSIS — Z1152 Encounter for screening for COVID-19: Secondary | ICD-10-CM | POA: Diagnosis not present

## 2021-03-05 ENCOUNTER — Other Ambulatory Visit: Payer: Self-pay | Admitting: Cardiovascular Disease

## 2021-03-05 DIAGNOSIS — R9431 Abnormal electrocardiogram [ECG] [EKG]: Secondary | ICD-10-CM

## 2021-03-05 DIAGNOSIS — I10 Essential (primary) hypertension: Secondary | ICD-10-CM

## 2021-03-06 NOTE — Telephone Encounter (Signed)
Prescription refill request for Eliquis received. Indication: Afib Last office visit: Nishan, 11/07/2020 Scr: 1.24, 09/23/2020 Age: 87 yo  Weight: 70.1 kg   Pt is on the incorrect dose of Eliquis per dosing criteria.

## 2021-03-06 NOTE — Telephone Encounter (Signed)
Spoke with [employer] D, pt needs some more recent labs to follow SCR trends. Called pt and scheduled him an appointment to come in on 3/10 for blood work. Pt stated he has enough medicine to get him to his lab appointment.

## 2021-03-09 ENCOUNTER — Other Ambulatory Visit: Payer: Self-pay

## 2021-03-09 ENCOUNTER — Other Ambulatory Visit: Payer: Medicare Other | Admitting: *Deleted

## 2021-03-09 DIAGNOSIS — R9431 Abnormal electrocardiogram [ECG] [EKG]: Secondary | ICD-10-CM

## 2021-03-09 DIAGNOSIS — I10 Essential (primary) hypertension: Secondary | ICD-10-CM

## 2021-03-09 LAB — CBC
Hematocrit: 40.7 % (ref 37.5–51.0)
Hemoglobin: 13.9 g/dL (ref 13.0–17.7)
MCH: 28.7 pg (ref 26.6–33.0)
MCHC: 34.2 g/dL (ref 31.5–35.7)
MCV: 84 fL (ref 79–97)
Platelets: 147 10*3/uL — ABNORMAL LOW (ref 150–450)
RBC: 4.84 x10E6/uL (ref 4.14–5.80)
RDW: 12.5 % (ref 11.6–15.4)
WBC: 5.8 10*3/uL (ref 3.4–10.8)

## 2021-03-09 LAB — BASIC METABOLIC PANEL
BUN/Creatinine Ratio: 26 — ABNORMAL HIGH (ref 10–24)
BUN: 29 mg/dL (ref 10–36)
CO2: 22 mmol/L (ref 20–29)
Calcium: 8.9 mg/dL (ref 8.6–10.2)
Chloride: 102 mmol/L (ref 96–106)
Creatinine, Ser: 1.1 mg/dL (ref 0.76–1.27)
Glucose: 77 mg/dL (ref 65–99)
Potassium: 3.8 mmol/L (ref 3.5–5.2)
Sodium: 139 mmol/L (ref 134–144)
eGFR: 64 mL/min/{1.73_m2} (ref >59)

## 2021-03-10 NOTE — Telephone Encounter (Signed)
Spoke with [employer] D- Will increase pt's Eliquis dose to 5mg  BID.

## 2021-03-10 NOTE — Telephone Encounter (Signed)
Scr: 1.10, 03/09/2021  Called and spoke to pt, was on speaker phone with family member as well. Made pt aware and family member that we are going to increase his Eliquis to 5mg  BID and that I would be sending in a new prescription. Pt verbalized understanding.

## 2021-04-11 DIAGNOSIS — Z23 Encounter for immunization: Secondary | ICD-10-CM | POA: Diagnosis not present

## 2021-05-09 NOTE — Progress Notes (Signed)
Virtual Visit via Video Note   This visit type was conducted due to national recommendations for restrictions regarding the COVID-19 Pandemic (e.g. social distancing) in an effort to limit this patient's exposure and mitigate transmission in our community.  Due to her co-morbid illnesses, this patient is at least at moderate risk for complications without adequate follow up.  This format is felt to be most appropriate for this patient at this time.  All issues noted in this document were discussed and addressed.  A limited physical exam was performed with this format.  Please refer to the patient's chart for her consent to telehealth for [facility].   Patient Location: Home Physician Location: Office   Date:  05/09/2021    Jared Saxon, PhD Date of Birth: 07-17-1929 Medical Record #[id number]  PCP:  [name], MD  Cardiologist:  [name]  No chief complaint on file.   History of Present Illness:  87 y.o. with history of HTN, COPD, GERD. Chronically abnormal ECG Did not tolerated Procardia for his BP.  Seen by NP 01/11/20 found to be in afib Started on eliquis and had [facility] 06/17/20. Tends to be bradycardic in sinus And is not Rx with AV nodal drugs currently   Held his DOAC for colonoscopy and EGD with Dr Fuller Plan 10/10/20 and had multiple polyps removed    Had COVID vaccine in October   Walks everyday Has a little Yorkie named [employer]    Past Medical History:  Diagnosis Date  . Acute, but ill-defined, cerebrovascular disease 04/07/2014  . Adenomatous colon polyp   . Allergy   . Atrial fibrillation ([place])   . BPH (benign prostatic hyperplasia)   . Cataract    removed both eyes   . COPD (chronic obstructive pulmonary disease) ([place])   . Eczema   . Erectile dysfunction   . Gallstones   . GERD (gastroesophageal reflux disease)   . Heart murmur   . HOH (hard of hearing)   . HTN (hypertension)   . Meningioma ([facility])    Right frontal  .  Meralgia paraesthetica   . Palpitations   . Urinary retention     Past Surgical History:  Procedure Laterality Date  . BLADDER DIVERTICULECTOMY  02/15/2003   Archie Endo 05/16/2011  . CARDIAC CATHETERIZATION    . CARDIOVERSION N/A 06/17/2020   Procedure: CARDIOVERSION;  Surgeon: [name], MD;  Location: [facility] ENDOSCOPY;  Service: Cardiovascular;  Laterality: N/A;  . CATARACT EXTRACTION Bilateral 2013  . CHOLECYSTECTOMY N/A 11/19/2014   Procedure: LAPAROSCOPIC CHOLECYSTECTOMY WITH INTRAOPERATIVE CHOLANGIOGRAM;  Surgeon: [name], MD;  Location: [place];  Service: General;  Laterality: N/A;  . COLONOSCOPY    . COLONOSCOPY W/ POLYPECTOMY    . GUM SURGERY  1999  . INGUINAL HERNIA REPAIR Left   . INGUINAL HERNIA REPAIR Right 11/2009  . LAPAROSCOPIC CHOLECYSTECTOMY  11/19/2014   w/IOC  . MEATOTOMY  06/2003   Distal urethral meatotomy with YAG laser/notes 05/16/2011  . POLYPECTOMY    . SUPRAPUBIC PROSTATECTOMY  02/15/2003   Archie Endo 05/16/2011     Medications: No outpatient medications have been marked as taking for the 05/18/21 encounter (Appointment) with [name], MD.     Allergies: Allergies  Allergen Reactions  . Aspirin     GI Bleeding  . Procardia [Nifedipine] Other (See Comments)    Headaches     Social History: The patient  reports that he quit smoking about 21 years ago. His smoking  use included cigarettes. He has a 8.00 pack-year smoking history. He has never used smokeless tobacco. He reports current alcohol use of about 14.0 standard drinks of alcohol per week. He reports that he does not use drugs.   Family History: The patient's family history includes Alzheimer's disease in his mother; Cancer - Lung in his sister; Heart attack in his father.   Review of Systems: Please see the history of present illness.   All other systems are reviewed and negative.   Physical Exam: VS:  There were no vitals taken for this visit. Marland Kitchen  BMI There is no height or weight on  file to calculate BMI.  Wt Readings from Last 3 Encounters:  11/07/20 70.1 kg  10/10/20 70.8 kg  09/30/20 70.8 kg   Elderly white male No distress No tachypnea No JVP elevation     LABORATORY DATA:  EKG:   afib rate 65 chronic inferior lateral T wave inversions  02/15/20   Lab Results  Component Value Date   WBC 5.8 03/09/2021   HGB 13.9 03/09/2021   HCT 40.7 03/09/2021   PLT 147 (L) 03/09/2021   GLUCOSE 77 03/09/2021   ALT 11 06/28/2020   AST 11 06/28/2020   NA 139 03/09/2021   K 3.8 03/09/2021   CL 102 03/09/2021   CREATININE 1.10 03/09/2021   BUN 29 03/09/2021   CO2 22 03/09/2021   TSH 2.270 01/11/2020   INR 1.14 11/11/2014    Other Studies Reviewed Today:  Echo 08/18/20   1. Left ventricular ejection fraction, by estimation, is 65 to 70%. The  left ventricle has normal function. The left ventricle has no regional  wall motion abnormalities. There is moderate variable left ventricular  hypertrophy of the basal-septal  segment. Left ventricular diastolic parameters are consistent with  age-related delayed relaxation (normal).  2. Right ventricular systolic function is normal. The right ventricular  size is normal. Tricuspid regurgitation signal is inadequate for assessing  PA pressure.  3. Left atrial size was mildly dilated.  4. Right atrial size was mildly dilated.  5. The mitral valve is normal in structure. Mild mitral valve  regurgitation. No evidence of mitral stenosis.  6. The aortic valve is tricuspid. Aortic valve regurgitation is not  visualized. Mild aortic valve sclerosis is present, with no evidence of  aortic valve stenosis.  7. Aortic dilatation noted. There is borderline dilatation of the  ascending aorta measuring 38 mm.  8. The inferior vena cava is normal in size with greater than 50%  respiratory variability, suggesting right atrial pressure of 3 mmHg.    Assessment/Plan:  1. Persistent AF -  DCC done 06/17/20 with  maintenance of NSR On Eliquis with no bleeding issues  Echo 08/18/20 with EF 65-70% mild biatrial enlargement and only mild MR   2. HTN - Well controlled.  Continue current medications and low sodium Dash type diet.     3. Chronically abnormal EKG with lateral T wave changes - active with no angina observe  Given age No indication for stress testing   5. GI:  F/u pathology with Dr Fuller Plan regarding polyps no bleeding issues  Normal bowel habits    Current medicines are reviewed with the patient today.  The patient does not have concerns regarding medicines other than what has been noted above.  The following changes have been made:  See abov e.  Labs/ tests ordered today include: None    No orders of the defined types were placed in  this encounter.  Time: spent reviewing chart echo June 2021 direct patient interview and composing note 20 minutes   Disposition:   FU in 6 months   Signed: Jenkins Rouge, MD  05/09/2021 1:29 PM  [facility] [address] [place], [place]  [address] Phone: [phone] Fax: [phone]

## 2021-05-18 ENCOUNTER — Telehealth (INDEPENDENT_AMBULATORY_CARE_PROVIDER_SITE_OTHER): Payer: Medicare Other | Admitting: Cardiovascular Disease

## 2021-05-18 ENCOUNTER — Other Ambulatory Visit: Payer: Self-pay

## 2021-05-18 VITALS — BP 112/84 | HR 61 | Ht 71.0 in | Wt 152.0 lb

## 2021-05-18 DIAGNOSIS — Z7901 Long term (current) use of anticoagulants: Secondary | ICD-10-CM | POA: Diagnosis not present

## 2021-05-18 DIAGNOSIS — I480 Paroxysmal atrial fibrillation: Secondary | ICD-10-CM | POA: Diagnosis not present

## 2021-05-18 DIAGNOSIS — Z5181 Encounter for therapeutic drug level monitoring: Secondary | ICD-10-CM | POA: Diagnosis not present

## 2021-05-18 NOTE — Patient Instructions (Signed)

## 2021-05-23 DIAGNOSIS — H57813 Brow ptosis, bilateral: Secondary | ICD-10-CM | POA: Diagnosis not present

## 2021-05-23 DIAGNOSIS — Z961 Presence of intraocular lens: Secondary | ICD-10-CM | POA: Diagnosis not present

## 2021-05-23 DIAGNOSIS — H04123 Dry eye syndrome of bilateral lacrimal glands: Secondary | ICD-10-CM | POA: Diagnosis not present

## 2021-05-23 DIAGNOSIS — H43812 Vitreous degeneration, left eye: Secondary | ICD-10-CM | POA: Diagnosis not present

## 2021-05-23 DIAGNOSIS — H0102B Squamous blepharitis left eye, upper and lower eyelids: Secondary | ICD-10-CM | POA: Diagnosis not present

## 2021-05-23 DIAGNOSIS — H1045 Other chronic allergic conjunctivitis: Secondary | ICD-10-CM | POA: Diagnosis not present

## 2021-05-23 DIAGNOSIS — H4720 Unspecified optic atrophy: Secondary | ICD-10-CM | POA: Diagnosis not present

## 2021-05-23 DIAGNOSIS — H0102A Squamous blepharitis right eye, upper and lower eyelids: Secondary | ICD-10-CM | POA: Diagnosis not present

## 2021-05-23 DIAGNOSIS — H401131 Primary open-angle glaucoma, bilateral, mild stage: Secondary | ICD-10-CM | POA: Diagnosis not present

## 2021-07-30 DIAGNOSIS — J449 Chronic obstructive pulmonary disease, unspecified: Secondary | ICD-10-CM | POA: Diagnosis not present

## 2021-07-30 DIAGNOSIS — I10 Essential (primary) hypertension: Secondary | ICD-10-CM | POA: Diagnosis not present

## 2021-07-30 DIAGNOSIS — K219 Gastro-esophageal reflux disease without esophagitis: Secondary | ICD-10-CM | POA: Diagnosis not present

## 2021-08-22 DIAGNOSIS — Z20822 Contact with and (suspected) exposure to covid-19: Secondary | ICD-10-CM | POA: Diagnosis not present

## 2021-08-29 DIAGNOSIS — Z20822 Contact with and (suspected) exposure to covid-19: Secondary | ICD-10-CM | POA: Diagnosis not present

## 2021-08-30 DIAGNOSIS — J449 Chronic obstructive pulmonary disease, unspecified: Secondary | ICD-10-CM | POA: Diagnosis not present

## 2021-08-30 DIAGNOSIS — I10 Essential (primary) hypertension: Secondary | ICD-10-CM | POA: Diagnosis not present

## 2021-08-30 DIAGNOSIS — K219 Gastro-esophageal reflux disease without esophagitis: Secondary | ICD-10-CM | POA: Diagnosis not present

## 2021-09-20 ENCOUNTER — Other Ambulatory Visit: Payer: Self-pay | Admitting: Cardiovascular Disease

## 2021-09-20 DIAGNOSIS — I10 Essential (primary) hypertension: Secondary | ICD-10-CM

## 2021-09-20 DIAGNOSIS — R9431 Abnormal electrocardiogram [ECG] [EKG]: Secondary | ICD-10-CM

## 2021-09-20 NOTE — Telephone Encounter (Signed)
Eliquis 5mg  refill request received. Patient is 87 years old, weight-68.9kg, Crea-1.10 on 03/09/21, Diagnosis-Afib, and last seen by Dr. Johnsie Cancel on 05/18/21 via Telemedicine Visit. Dose is appropriate based on dosing criteria. Will send in refill to requested pharmacy.

## 2021-09-25 DIAGNOSIS — Z23 Encounter for immunization: Secondary | ICD-10-CM | POA: Diagnosis not present

## 2021-10-18 DIAGNOSIS — Z23 Encounter for immunization: Secondary | ICD-10-CM | POA: Diagnosis not present

## 2021-10-30 DIAGNOSIS — K219 Gastro-esophageal reflux disease without esophagitis: Secondary | ICD-10-CM | POA: Diagnosis not present

## 2021-10-30 DIAGNOSIS — J449 Chronic obstructive pulmonary disease, unspecified: Secondary | ICD-10-CM | POA: Diagnosis not present

## 2021-10-30 DIAGNOSIS — I10 Essential (primary) hypertension: Secondary | ICD-10-CM | POA: Diagnosis not present

## 2021-11-21 DIAGNOSIS — H0102B Squamous blepharitis left eye, upper and lower eyelids: Secondary | ICD-10-CM | POA: Diagnosis not present

## 2021-11-21 DIAGNOSIS — H4720 Unspecified optic atrophy: Secondary | ICD-10-CM | POA: Diagnosis not present

## 2021-11-21 DIAGNOSIS — H401122 Primary open-angle glaucoma, left eye, moderate stage: Secondary | ICD-10-CM | POA: Diagnosis not present

## 2021-11-21 DIAGNOSIS — H57813 Brow ptosis, bilateral: Secondary | ICD-10-CM | POA: Diagnosis not present

## 2021-11-21 DIAGNOSIS — H401111 Primary open-angle glaucoma, right eye, mild stage: Secondary | ICD-10-CM | POA: Diagnosis not present

## 2021-11-21 DIAGNOSIS — Z961 Presence of intraocular lens: Secondary | ICD-10-CM | POA: Diagnosis not present

## 2021-11-21 DIAGNOSIS — H04123 Dry eye syndrome of bilateral lacrimal glands: Secondary | ICD-10-CM | POA: Diagnosis not present

## 2021-11-21 DIAGNOSIS — H43812 Vitreous degeneration, left eye: Secondary | ICD-10-CM | POA: Diagnosis not present

## 2021-11-21 DIAGNOSIS — H1045 Other chronic allergic conjunctivitis: Secondary | ICD-10-CM | POA: Diagnosis not present

## 2021-11-21 DIAGNOSIS — H0102A Squamous blepharitis right eye, upper and lower eyelids: Secondary | ICD-10-CM | POA: Diagnosis not present

## 2021-12-05 NOTE — Progress Notes (Signed)
Date:  12/08/2021   [name], PhD Date of Birth: 02-05-1929 Medical Record #[id number]  PCP:  [name], MD  Cardiologist:  [name]  History of Present Illness:  87 y.o. with history of HTN, COPD, GERD. Chronically abnormal ECG Did not tolerated Procardia for his BP.  Seen by NP 01/11/20 found to be in afib Started on eliquis and had [facility] 06/17/20. Tends to be bradycardic in sinus And is not Rx with AV nodal drugs currently   Held his DOAC for colonoscopy and EGD with Dr Fuller Plan 10/10/20 and had multiple polyps removed    Walks everyday at [employer] Has a little Yorkie named [employer] that[employer]    Past Medical History:  Diagnosis Date   Acute, but ill-defined, cerebrovascular disease 04/07/2014   Adenomatous colon polyp    Allergy    Atrial fibrillation (HCC)    BPH (benign prostatic hyperplasia)    Cataract    removed both eyes    COPD (chronic obstructive pulmonary disease) (HCC)    Eczema    Erectile dysfunction    Gallstones    GERD (gastroesophageal reflux disease)    Heart murmur    HOH (hard of hearing)    HTN (hypertension)    Meningioma ([place])    Right frontal   Meralgia paraesthetica    Palpitations    Urinary retention     Past Surgical History:  Procedure Laterality Date   BLADDER DIVERTICULECTOMY  02/15/2003   Archie Endo 05/16/2011   CARDIAC CATHETERIZATION     CARDIOVERSION N/A 06/17/2020   Procedure: CARDIOVERSION;  Surgeon: [name], MD;  Location: [place];  Service: Cardiovascular;  Laterality: N/A;   CATARACT EXTRACTION Bilateral 2013   CHOLECYSTECTOMY N/A 11/19/2014   Procedure: LAPAROSCOPIC CHOLECYSTECTOMY WITH INTRAOPERATIVE CHOLANGIOGRAM;  Surgeon: [name], MD;  Location: [place];  Service: General;  Laterality: N/A;   COLONOSCOPY     COLONOSCOPY W/ POLYPECTOMY     [place] Left    INGUINAL HERNIA REPAIR Right 11/2009   LAPAROSCOPIC CHOLECYSTECTOMY  11/19/2014    w/IOC   MEATOTOMY  06/2003   Distal urethral meatotomy with YAG laser/notes 05/16/2011   POLYPECTOMY     SUPRAPUBIC PROSTATECTOMY  02/15/2003   Archie Endo 05/16/2011     Medications: Current Meds  Medication Sig   acetaminophen (TYLENOL) 500 MG tablet Take 500 mg by mouth every 6 (six) hours as needed.   amLODipine (NORVASC) 5 MG tablet Take 5 mg by mouth daily.   ciclopirox (LOPROX) 0.77 % cream Apply 1 application topically daily.   dorzolamide-timolol (COSOPT) 22.3-6.8 MG/ML ophthalmic solution Place 1 drop into the left eye 2 (two) times daily.   ELIQUIS 5 MG TABS tablet TAKE 1 TABLET(5 MG) BY MOUTH TWICE DAILY   ketotifen (ZADITOR) 0.025 % ophthalmic solution Place 1 drop into both eyes daily.   latanoprost (XALATAN) 0.005 % ophthalmic solution Place 1 drop into both eyes at bedtime.   Naproxen Sodium 220 MG CAPS Take 220 mg by mouth daily as needed (pain).   OVER THE COUNTER MEDICATION Luna sleeping aid: Take one at bedtime as needed   Polyethyl Glycol-Propyl Glycol (SYSTANE OP) Place 1 drop into both eyes daily as needed (dry eyes).   terazosin (HYTRIN) 10 MG capsule Take 10 mg by mouth at bedtime.    triamcinolone cream (KENALOG) 0.1 % Apply 1 application topically daily as needed (dry and itching skin).  Allergies: Allergies  Allergen Reactions   Aspirin     GI Bleeding   Procardia [Nifedipine] Other (See Comments)    Headaches     Social History: The patient  reports that he quit smoking about 22 years ago. His smoking use included cigarettes. He has a 8.00 pack-year smoking history. He has never used smokeless tobacco. He reports current alcohol use of about 14.0 standard drinks per week. He reports that he does not use drugs.   Family History: The patient's family history includes Alzheimer's disease in his mother; Cancer - Lung in his sister; Heart attack in his father.   Review of Systems: Please see the history of present illness.   All other systems are  reviewed and negative.   Physical Exam: VS:  BP (!) 148/78   Pulse 64   Ht 5\' 11"  (1.803 m)   Wt 155 lb 3.2 oz (70.4 kg)   SpO2 99%   BMI 21.65 kg/m  .  BMI Body mass index is 21.65 kg/m.  Wt Readings from Last 3 Encounters:  12/08/21 155 lb 3.2 oz (70.4 kg)  05/18/21 152 lb (68.9 kg)  11/07/20 154 lb 9.6 oz (70.1 kg)   Affect appropriate Healthy:  appears stated age HEENT: normal Neck supple with no adenopathy JVP normal no bruits no thyromegaly Lungs clear with no wheezing and good diaphragmatic motion Heart:  S1/S2 no murmur, no rub, gallop or click PMI normal Abdomen: benighn, BS positve, no tenderness, no AAA no bruit.  No HSM or HJR Distal pulses intact with no bruits No edema Neuro non-focal Skin warm and dry No muscular weakness   LABORATORY DATA:  EKG:   afib rate 65 chronic inferior lateral T wave inversions  02/15/20  12/08/2021 afib rate 64 chronic lateral T wave changes   Lab Results  Component Value Date   WBC 5.8 03/09/2021   HGB 13.9 03/09/2021   HCT 40.7 03/09/2021   PLT 147 (L) 03/09/2021   GLUCOSE 77 03/09/2021   ALT 11 06/28/2020   AST 11 06/28/2020   NA 139 03/09/2021   K 3.8 03/09/2021   CL 102 03/09/2021   CREATININE 1.10 03/09/2021   BUN 29 03/09/2021   CO2 22 03/09/2021   TSH 2.270 01/11/2020   INR 1.14 11/11/2014    Other Studies Reviewed Today:  Echo 08/18/20    1. Left ventricular ejection fraction, by estimation, is 65 to 70%. The  left ventricle has normal function. The left ventricle has no regional  wall motion abnormalities. There is moderate variable left ventricular  hypertrophy of the basal-septal  segment. Left ventricular diastolic parameters are consistent with  age-related delayed relaxation (normal).   2. Right ventricular systolic function is normal. The right ventricular  size is normal. Tricuspid regurgitation signal is inadequate for assessing  PA pressure.   3. Left atrial size was mildly dilated.   4.  Right atrial size was mildly dilated.   5. The mitral valve is normal in structure. Mild mitral valve  regurgitation. No evidence of mitral stenosis.   6. The aortic valve is tricuspid. Aortic valve regurgitation is not  visualized. Mild aortic valve sclerosis is present, with no evidence of  aortic valve stenosis.   7. Aortic dilatation noted. There is borderline dilatation of the  ascending aorta measuring 38 mm.   8. The inferior vena cava is normal in size with greater than 50%  respiratory variability, suggesting right atrial pressure of 3 mmHg.  Assessment/Plan:  1. PAF -  [place] done 06/17/20  wit asymptomatic reversion to afib On Eliquis with no bleeding issues  Echo 08/18/20 with moderate LVH EF 65-70% mild biatrial enlargement and only mild MR   2. HTN - Well controlled.  Continue current medications and low sodium Dash type diet.     3. Chronically abnormal EKG with lateral T wave changes - active with no angina observe  Given age No indication for stress testing   5. GI:  F/u pathology with Dr Fuller Plan regarding polyps no bleeding issues  Normal bowel habits    Current medicines are reviewed with the patient today.  The patient does not have concerns regarding medicines other than what has been noted above.  The following changes have been made:  None   Labs/ tests ordered today include: None   Disposition:   FU in 6 months   Signed: Jenkins Rouge, MD  12/08/2021 8:58 AM  [facility] [address] [place], [place]  [address] Phone: [phone] Fax: [phone]

## 2021-12-08 ENCOUNTER — Other Ambulatory Visit: Payer: Self-pay

## 2021-12-08 ENCOUNTER — Ambulatory Visit (INDEPENDENT_AMBULATORY_CARE_PROVIDER_SITE_OTHER): Payer: Medicare Other | Admitting: Cardiovascular Disease

## 2021-12-08 ENCOUNTER — Encounter: Payer: Self-pay | Admitting: Cardiovascular Disease

## 2021-12-08 VITALS — BP 148/78 | HR 64 | Ht 71.0 in | Wt 155.2 lb

## 2021-12-08 DIAGNOSIS — I480 Paroxysmal atrial fibrillation: Secondary | ICD-10-CM

## 2021-12-08 DIAGNOSIS — Z5181 Encounter for therapeutic drug level monitoring: Secondary | ICD-10-CM

## 2021-12-08 DIAGNOSIS — Z7901 Long term (current) use of anticoagulants: Secondary | ICD-10-CM | POA: Diagnosis not present

## 2021-12-08 DIAGNOSIS — I10 Essential (primary) hypertension: Secondary | ICD-10-CM

## 2021-12-08 NOTE — Patient Instructions (Signed)
Medication Instructions:  Your physician recommends that you continue on your current medications as directed. Please refer to the Current Medication list given to you today.  *If you need a refill on your cardiac medications before your next appointment, please call your pharmacy*  Follow-Up: At [facility], you and your health needs are our priority.  As part of our continuing mission to provide you with exceptional heart care, we have created designated Provider Care Teams.  These Care Teams include your primary Cardiologist (physician) and Advanced Practice Providers (APPs -  Physician Assistants and Nurse Practitioners) who all work together to provide you with the care you need, when you need it.  Your next appointment:   6 month(s)  The format for your next appointment:   In Person  Provider:   Peter Nishan, MD     

## 2021-12-19 DIAGNOSIS — I10 Essential (primary) hypertension: Secondary | ICD-10-CM | POA: Diagnosis not present

## 2021-12-28 DIAGNOSIS — J449 Chronic obstructive pulmonary disease, unspecified: Secondary | ICD-10-CM | POA: Diagnosis not present

## 2021-12-28 DIAGNOSIS — I480 Paroxysmal atrial fibrillation: Secondary | ICD-10-CM | POA: Diagnosis not present

## 2021-12-28 DIAGNOSIS — D6869 Other thrombophilia: Secondary | ICD-10-CM | POA: Diagnosis not present

## 2021-12-28 DIAGNOSIS — D692 Other nonthrombocytopenic purpura: Secondary | ICD-10-CM | POA: Diagnosis not present

## 2021-12-28 DIAGNOSIS — N529 Male erectile dysfunction, unspecified: Secondary | ICD-10-CM | POA: Diagnosis not present

## 2021-12-28 DIAGNOSIS — I6523 Occlusion and stenosis of bilateral carotid arteries: Secondary | ICD-10-CM | POA: Diagnosis not present

## 2021-12-28 DIAGNOSIS — K219 Gastro-esophageal reflux disease without esophagitis: Secondary | ICD-10-CM | POA: Diagnosis not present

## 2021-12-28 DIAGNOSIS — I10 Essential (primary) hypertension: Secondary | ICD-10-CM | POA: Diagnosis not present

## 2021-12-28 DIAGNOSIS — I700 Atherosclerosis of aorta: Secondary | ICD-10-CM | POA: Diagnosis not present

## 2021-12-28 DIAGNOSIS — N401 Enlarged prostate with lower urinary tract symptoms: Secondary | ICD-10-CM | POA: Diagnosis not present

## 2021-12-28 DIAGNOSIS — Z0000 Encounter for general adult medical examination without abnormal findings: Secondary | ICD-10-CM | POA: Diagnosis not present

## 2021-12-28 DIAGNOSIS — D696 Thrombocytopenia, unspecified: Secondary | ICD-10-CM | POA: Diagnosis not present

## 2021-12-29 DIAGNOSIS — I10 Essential (primary) hypertension: Secondary | ICD-10-CM | POA: Diagnosis not present

## 2021-12-29 DIAGNOSIS — K219 Gastro-esophageal reflux disease without esophagitis: Secondary | ICD-10-CM | POA: Diagnosis not present

## 2021-12-29 DIAGNOSIS — J449 Chronic obstructive pulmonary disease, unspecified: Secondary | ICD-10-CM | POA: Diagnosis not present

## 2022-02-09 ENCOUNTER — Encounter: Payer: Self-pay | Admitting: Cardiovascular Disease

## 2022-02-27 DIAGNOSIS — J449 Chronic obstructive pulmonary disease, unspecified: Secondary | ICD-10-CM | POA: Diagnosis not present

## 2022-02-27 DIAGNOSIS — K219 Gastro-esophageal reflux disease without esophagitis: Secondary | ICD-10-CM | POA: Diagnosis not present

## 2022-02-27 DIAGNOSIS — I10 Essential (primary) hypertension: Secondary | ICD-10-CM | POA: Diagnosis not present

## 2022-03-05 ENCOUNTER — Emergency Department (HOSPITAL_BASED_OUTPATIENT_CLINIC_OR_DEPARTMENT_OTHER): Payer: Medicare Other | Admitting: Radiology

## 2022-03-05 ENCOUNTER — Other Ambulatory Visit: Payer: Self-pay

## 2022-03-05 ENCOUNTER — Encounter (HOSPITAL_BASED_OUTPATIENT_CLINIC_OR_DEPARTMENT_OTHER): Payer: Self-pay

## 2022-03-05 ENCOUNTER — Inpatient Hospital Stay (HOSPITAL_BASED_OUTPATIENT_CLINIC_OR_DEPARTMENT_OTHER)
Admission: EM | Admit: 2022-03-05 | Discharge: 2022-03-07 | DRG: 194 | Disposition: A | Discharge status: Home / Self Care | Payer: Medicare Other | Attending: Internal Medicine | Admitting: Internal Medicine

## 2022-03-05 DIAGNOSIS — R008 Other abnormalities of heart beat: Secondary | ICD-10-CM | POA: Diagnosis present

## 2022-03-05 DIAGNOSIS — R509 Fever, unspecified: Secondary | ICD-10-CM | POA: Diagnosis not present

## 2022-03-05 DIAGNOSIS — I10 Essential (primary) hypertension: Secondary | ICD-10-CM | POA: Diagnosis not present

## 2022-03-05 DIAGNOSIS — Z86010 Personal history of colonic polyps: Secondary | ICD-10-CM

## 2022-03-05 DIAGNOSIS — Z820 Family history of epilepsy and other diseases of the nervous system: Secondary | ICD-10-CM | POA: Diagnosis not present

## 2022-03-05 DIAGNOSIS — Z886 Allergy status to analgesic agent status: Secondary | ICD-10-CM

## 2022-03-05 DIAGNOSIS — Z8249 Family history of ischemic heart disease and other diseases of the circulatory system: Secondary | ICD-10-CM | POA: Diagnosis not present

## 2022-03-05 DIAGNOSIS — Z79899 Other long term (current) drug therapy: Secondary | ICD-10-CM

## 2022-03-05 DIAGNOSIS — H409 Unspecified glaucoma: Secondary | ICD-10-CM | POA: Diagnosis present

## 2022-03-05 DIAGNOSIS — I480 Paroxysmal atrial fibrillation: Secondary | ICD-10-CM | POA: Diagnosis present

## 2022-03-05 DIAGNOSIS — Z888 Allergy status to other drugs, medicaments and biological substances status: Secondary | ICD-10-CM

## 2022-03-05 DIAGNOSIS — Z20822 Contact with and (suspected) exposure to covid-19: Secondary | ICD-10-CM | POA: Diagnosis present

## 2022-03-05 DIAGNOSIS — Z87891 Personal history of nicotine dependence: Secondary | ICD-10-CM

## 2022-03-05 DIAGNOSIS — R059 Cough, unspecified: Secondary | ICD-10-CM | POA: Diagnosis not present

## 2022-03-05 DIAGNOSIS — Z9049 Acquired absence of other specified parts of digestive tract: Secondary | ICD-10-CM | POA: Diagnosis not present

## 2022-03-05 DIAGNOSIS — Z7901 Long term (current) use of anticoagulants: Secondary | ICD-10-CM

## 2022-03-05 DIAGNOSIS — J449 Chronic obstructive pulmonary disease, unspecified: Secondary | ICD-10-CM | POA: Diagnosis present

## 2022-03-05 DIAGNOSIS — J189 Pneumonia, unspecified organism: Principal | ICD-10-CM | POA: Diagnosis present

## 2022-03-05 DIAGNOSIS — E860 Dehydration: Secondary | ICD-10-CM | POA: Diagnosis not present

## 2022-03-05 DIAGNOSIS — J440 Chronic obstructive pulmonary disease with acute lower respiratory infection: Secondary | ICD-10-CM | POA: Diagnosis present

## 2022-03-05 LAB — CBC WITH DIFFERENTIAL/PLATELET
Abs Immature Granulocytes: 0.03 10*3/uL (ref 0.00–0.07)
Basophils Absolute: 0 10*3/uL (ref 0.0–0.1)
Basophils Relative: 0 %
Eosinophils Absolute: 0 10*3/uL (ref 0.0–0.5)
Eosinophils Relative: 0 %
HCT: 38.4 % — ABNORMAL LOW (ref 39.0–52.0)
Hemoglobin: 12.4 g/dL — ABNORMAL LOW (ref 13.0–17.0)
Immature Granulocytes: 0 %
Lymphocytes Relative: 38 %
Lymphs Abs: 3.4 10*3/uL (ref 0.7–4.0)
MCH: 27.1 pg (ref 26.0–34.0)
MCHC: 32.3 g/dL (ref 30.0–36.0)
MCV: 83.8 fL (ref 80.0–100.0)
Monocytes Absolute: 0.8 10*3/uL (ref 0.1–1.0)
Monocytes Relative: 9 %
Neutro Abs: 4.9 10*3/uL (ref 1.7–7.7)
Neutrophils Relative %: 53 %
Platelets: 120 10*3/uL — ABNORMAL LOW (ref 150–400)
RBC: 4.58 MIL/uL (ref 4.22–5.81)
RDW: 14.4 % (ref 11.5–15.5)
WBC: 9.2 10*3/uL (ref 4.0–10.5)
nRBC: 0 % (ref 0.0–0.2)

## 2022-03-05 LAB — RESP PANEL BY RT-PCR (FLU A&B, COVID) ARPGX2
Influenza A by PCR: NEGATIVE
Influenza B by PCR: NEGATIVE
SARS Coronavirus 2 by RT PCR: NEGATIVE

## 2022-03-05 LAB — BASIC METABOLIC PANEL
Anion gap: 10 (ref 5–15)
BUN: 34 mg/dL — ABNORMAL HIGH (ref 8–23)
CO2: 22 mmol/L (ref 22–32)
Calcium: 8.6 mg/dL — ABNORMAL LOW (ref 8.9–10.3)
Chloride: 104 mmol/L (ref 98–111)
Creatinine, Ser: 1.07 mg/dL (ref 0.61–1.24)
GFR, Estimated: >60 mL/min (ref >60)
Glucose, Bld: 125 mg/dL — ABNORMAL HIGH (ref 70–99)
Potassium: 3.7 mmol/L (ref 3.5–5.1)
Sodium: 136 mmol/L (ref 135–145)

## 2022-03-05 MED ORDER — SODIUM CHLORIDE 0.9 % IV SOLN
1.0000 g | Freq: Once | INTRAVENOUS | Status: AC
  Administered 2022-03-05: 1 g via INTRAVENOUS
  Filled 2022-03-05: qty 10

## 2022-03-05 MED ORDER — SODIUM CHLORIDE 0.9 % IV SOLN
500.0000 mg | Freq: Once | INTRAVENOUS | Status: AC
  Administered 2022-03-05: 500 mg via INTRAVENOUS
  Filled 2022-03-05: qty 5

## 2022-03-05 NOTE — ED Triage Notes (Signed)
Patient here POV from Home. ? ?Endorses Congestion, Lack of Appetite, Fatigue, Productive Cough, Fevers for approximately 9 days. ? ?No History of Cancer or Immunosuppressants.  ? ?Fever of 101 today. Nyquil this AM. No CP. No SOB. No N/V/D. No Constipation. No Urinary Symptoms.  ? ?A&Ox4. GCS 15. Ambulatory. NAD Noted during Triage. ?

## 2022-03-05 NOTE — ED Provider Notes (Signed)
?[place] EMERGENCY DEPT ?Provider Note ? ? ?CSN: [id number] ?Arrival date & time: 03/05/22  1729 ? ?  ? ?History ? ?Chief Complaint  ?Patient presents with  ? Fever  ? ? ?[name], PhD is a 88 y.o. male. ? ? ?Fever ?Associated symptoms: chest pain and cough   ? ?88 year old male presenting to the emergency department with nasal congestion, lack of appetite, fatigue, productive cough for the past 9 days.  The patient states that his Tmax was 101 today.  He endorses pleuritic chest discomfort, mild shortness of breath and productive cough.  He endorses cough productive of yellow sputum.  He denies any nausea, vomiting, diarrhea. ? ?Home Medications ?Prior to Admission medications   ?Medication Sig Start Date End Date Taking? Authorizing Provider  ?acetaminophen (TYLENOL) 500 MG tablet Take 500 mg by mouth every 6 (six) hours as needed.    [provider]  ?amLODipine (NORVASC) 5 MG tablet Take 5 mg by mouth daily. 10/12/20   [provider]  ?ciclopirox (LOPROX) 0.77 % cream Apply 1 application topically daily. 01/20/20   [provider]  ?dorzolamide-timolol (COSOPT) 22.3-6.8 MG/ML ophthalmic solution Place 1 drop into the left eye 2 (two) times daily. 11/21/21   [provider]  ?ELIQUIS 5 MG TABS tablet TAKE 1 TABLET(5 MG) BY MOUTH TWICE DAILY 09/20/21   [name], MD  ?ketotifen (ZADITOR) 0.025 % ophthalmic solution Place 1 drop into both eyes daily.    [provider]  ?latanoprost (XALATAN) 0.005 % ophthalmic solution Place 1 drop into both eyes at bedtime. 12/14/19   [provider]  ?Naproxen Sodium 220 MG CAPS Take 220 mg by mouth daily as needed (pain).    [provider]  ?OVER THE COUNTER MEDICATION Luna sleeping aid: Take one at bedtime as needed    [provider]  ?Polyethyl Glycol-Propyl Glycol (SYSTANE OP) Place 1 drop into both eyes daily as needed (dry eyes).    [provider]  ?terazosin  (HYTRIN) 10 MG capsule Take 10 mg by mouth at bedtime.  11/21/12   [provider]  ?triamcinolone cream (KENALOG) 0.1 % Apply 1 application topically daily as needed (dry and itching skin).  02/09/20   [provider]  ?   ? ?Allergies    ?Aspirin and Procardia [nifedipine]   ? ?Review of Systems   ?Review of Systems  ?Constitutional:  Positive for fever.  ?Respiratory:  Positive for cough and shortness of breath.   ?Cardiovascular:  Positive for chest pain.  ?All other systems reviewed and are negative. ? ?Physical Exam ?Updated Vital Signs ?BP (!) 147/82 (BP Location: Right Arm)   Pulse 65   Temp 98.2 ?F (36.8 ?C) (Oral)   Resp (!) 25   Ht '5\' 11"'$  (1.803 m)   Wt 70.4 kg   SpO2 99%   BMI 21.65 kg/m?  ?Physical Exam ?Vitals and nursing note reviewed.  ?Constitutional:   ?   General: He is not in acute distress. ?   Appearance: He is well-developed.  ?HENT:  ?   Head: Normocephalic and atraumatic.  ?Eyes:  ?   Conjunctiva/sclera: Conjunctivae normal.  ?Cardiovascular:  ?   Rate and Rhythm: Normal rate and regular rhythm.  ?   Pulses: Normal pulses.  ?   Heart sounds: Normal heart sounds.  ?Pulmonary:  ?   Effort: Pulmonary effort is normal. No respiratory distress.  ?   Breath sounds: Normal breath sounds.  ?Abdominal:  ?   Palpations:  Abdomen is soft.  ?   Tenderness: There is no abdominal tenderness.  ?Musculoskeletal:     ?   General: No swelling.  ?   Cervical back: Neck supple.  ?Skin: ?   General: Skin is warm and dry.  ?   Capillary Refill: Capillary refill takes less than 2 seconds.  ?Neurological:  ?   Mental Status: He is alert.  ?Psychiatric:     ?   Mood and Affect: Mood normal.  ? ? ?ED Results / Procedures / Treatments   ?Labs ?(all labs ordered are listed, but only abnormal results are displayed) ?Labs Reviewed  ?CBC WITH DIFFERENTIAL/PLATELET - Abnormal; Notable for the following components:  ?    Result Value  ? Hemoglobin 12.4 (*)   ? HCT 38.4 (*)   ? Platelets 120 (*)   ? All  other components within normal limits  ?BASIC METABOLIC PANEL - Abnormal; Notable for the following components:  ? Glucose, Bld 125 (*)   ? BUN 34 (*)   ? Calcium 8.6 (*)   ? All other components within normal limits  ?RESP PANEL BY RT-PCR (FLU A&B, COVID) [id number]  ? ? ?EKG ?None ? ?Radiology ?DG Chest 2 View ? ?Result Date: 03/05/2022 ?CLINICAL DATA:  Fever and cough. EXAM: CHEST - 2 VIEW COMPARISON:  11/04/2018 FINDINGS: Subtle hazy patchy density over the left mid to lower lung which could be due to early infection. Increased density along the major fissure on the lateral film suggesting mild atelectasis. No significant effusion. Cardiomediastinal silhouette and remainder of the exam is unchanged. IMPRESSION: Subtle hazy patchy density over the left mid to lower lung which could be due to early infection. Electronically Signed   By: Marin Olp M.D.   On: 03/05/2022 18:11   ? ?Procedures ?Procedures  ? ? ?Medications Ordered in ED ?Medications  ?cefTRIAXone (ROCEPHIN) 1 g in sodium chloride 0.9 % 100 mL IVPB (0 g Intravenous Stopped 03/05/22 2300)  ?azithromycin (ZITHROMAX) 500 mg in sodium chloride 0.9 % 250 mL IVPB (0 mg Intravenous Stopped 03/06/22 0009)  ? ? ?ED Course/ Medical Decision Making/ A&P ?  ?                        ?Medical Decision Making ?Amount and/or Complexity of Data Reviewed ?Labs: ordered. ?Radiology: ordered. ? ?Risk ?Decision regarding hospitalization. ? ? ?88 year old male presenting to the emergency department with nasal congestion, lack of appetite, fatigue, productive cough for the past 9 days.  The patient states that his Tmax was 101 today.  He endorses pleuritic chest discomfort, mild shortness of breath and productive cough.  He endorses cough productive of yellow sputum.  He denies any nausea, vomiting, diarrhea. ? ?On arrival, the patient was afebrile, temperature 100.2, not tachycardic or tachypneic, saturating well on room air.  The patient presents with roughly 9 days of URI  symptoms with developing worsening productive cough concerning for pneumonia.  An EKG was performed which revealed ventricular bigeminy, atrial fibrillation without RVR. ? ?Screening laboratory work-up performed significant for COVID-19 and influenza PCR testing negative, CBC without a leukocytosis, stable anemia to 12.4, BMP generally unremarkable.  A chest x-ray was performed which was concerning for bacterial pneumonia , reviewed by myself with haziness over the left mid to lower lung concerning for developing bacterial pneumonia.  I agree with the radiology interpretation.  The patient was covered for CAP with Rocephin and azithromycin.  Given his age, multiple medical comorbidities,  I did recommend admission for observation and IV antibiotics in the setting of community-acquired pneumonia.  I discussed the patient with Dr. Bridgett Larsson of hospitalist medicine who accepted the patient in admission. ? ? ?Final Clinical Impression(s) / ED Diagnoses ?Final diagnoses:  ?Community acquired pneumonia, unspecified laterality  ? ? ?Rx / DC Orders ?ED Discharge Orders   ? ? None  ? ?  ? ? ?  ?Regan Lemming, MD ?03/06/22 0113 ? ?

## 2022-03-05 NOTE — Progress Notes (Signed)
  [facility] will assume care on arrival to accepting facility. Until arrival, care as per EDP. However, [facility] available 24/7 for questions and assistance.   Nursing staff please page [facility] Admits and Consults ([phone]) as soon as the patient arrives to the hospital.  Alastor Kneale, DO [facility]  

## 2022-03-06 ENCOUNTER — Other Ambulatory Visit: Payer: Self-pay

## 2022-03-06 DIAGNOSIS — E860 Dehydration: Secondary | ICD-10-CM | POA: Diagnosis present

## 2022-03-06 DIAGNOSIS — I10 Essential (primary) hypertension: Secondary | ICD-10-CM | POA: Diagnosis present

## 2022-03-06 DIAGNOSIS — I480 Paroxysmal atrial fibrillation: Secondary | ICD-10-CM | POA: Diagnosis present

## 2022-03-06 DIAGNOSIS — J189 Pneumonia, unspecified organism: Secondary | ICD-10-CM | POA: Diagnosis present

## 2022-03-06 DIAGNOSIS — Z888 Allergy status to other drugs, medicaments and biological substances status: Secondary | ICD-10-CM | POA: Diagnosis not present

## 2022-03-06 DIAGNOSIS — Z820 Family history of epilepsy and other diseases of the nervous system: Secondary | ICD-10-CM | POA: Diagnosis not present

## 2022-03-06 DIAGNOSIS — Z8249 Family history of ischemic heart disease and other diseases of the circulatory system: Secondary | ICD-10-CM | POA: Diagnosis not present

## 2022-03-06 DIAGNOSIS — J440 Chronic obstructive pulmonary disease with acute lower respiratory infection: Secondary | ICD-10-CM | POA: Diagnosis present

## 2022-03-06 DIAGNOSIS — R008 Other abnormalities of heart beat: Secondary | ICD-10-CM | POA: Diagnosis present

## 2022-03-06 DIAGNOSIS — Z886 Allergy status to analgesic agent status: Secondary | ICD-10-CM | POA: Diagnosis not present

## 2022-03-06 DIAGNOSIS — Z86010 Personal history of colonic polyps: Secondary | ICD-10-CM | POA: Diagnosis not present

## 2022-03-06 DIAGNOSIS — Z9049 Acquired absence of other specified parts of digestive tract: Secondary | ICD-10-CM | POA: Diagnosis not present

## 2022-03-06 DIAGNOSIS — Z87891 Personal history of nicotine dependence: Secondary | ICD-10-CM | POA: Diagnosis not present

## 2022-03-06 DIAGNOSIS — Z7901 Long term (current) use of anticoagulants: Secondary | ICD-10-CM | POA: Diagnosis not present

## 2022-03-06 DIAGNOSIS — Z79899 Other long term (current) drug therapy: Secondary | ICD-10-CM | POA: Diagnosis not present

## 2022-03-06 DIAGNOSIS — Z20822 Contact with and (suspected) exposure to covid-19: Secondary | ICD-10-CM | POA: Diagnosis present

## 2022-03-06 DIAGNOSIS — H409 Unspecified glaucoma: Secondary | ICD-10-CM | POA: Diagnosis present

## 2022-03-06 LAB — EXPECTORATED SPUTUM ASSESSMENT W GRAM STAIN, RFLX TO RESP C

## 2022-03-06 LAB — STREP PNEUMONIAE URINARY ANTIGEN: Strep Pneumo Urinary Antigen: NEGATIVE

## 2022-03-06 MED ORDER — ACETAMINOPHEN 650 MG RE SUPP: 650.0000 mg | Freq: Four times a day (QID) | RECTAL | Status: DC | PRN

## 2022-03-06 MED ORDER — LATANOPROST 0.005 % OP SOLN
1.0000 [drp] | Freq: Every day | OPHTHALMIC | Status: DC
  Administered 2022-03-06: 1 [drp] via OPHTHALMIC
  Filled 2022-03-06: qty 2.5

## 2022-03-06 MED ORDER — DORZOLAMIDE HCL-TIMOLOL MAL 2-0.5 % OP SOLN
1.0000 [drp] | Freq: Two times a day (BID) | OPHTHALMIC | Status: DC
  Administered 2022-03-06 – 2022-03-07 (×2): 1 [drp] via OPHTHALMIC
  Filled 2022-03-06: qty 10

## 2022-03-06 MED ORDER — AZITHROMYCIN 250 MG PO TABS
500.0000 mg | ORAL_TABLET | Freq: Every day | ORAL | Status: DC
  Administered 2022-03-06: 500 mg via ORAL
  Filled 2022-03-06: qty 2

## 2022-03-06 MED ORDER — SODIUM CHLORIDE 0.9 % IV SOLN: 500.0000 mg | INTRAVENOUS | Status: DC

## 2022-03-06 MED ORDER — GUAIFENESIN ER 600 MG PO TB12
1200.0000 mg | ORAL_TABLET | Freq: Two times a day (BID) | ORAL | Status: DC
  Administered 2022-03-06 – 2022-03-07 (×2): 1200 mg via ORAL
  Filled 2022-03-06 (×2): qty 2

## 2022-03-06 MED ORDER — SODIUM CHLORIDE 0.9 % IV SOLN
1.0000 g | INTRAVENOUS | Status: DC
  Administered 2022-03-06: 1 g via INTRAVENOUS
  Filled 2022-03-06: qty 10

## 2022-03-06 MED ORDER — ACETAMINOPHEN 325 MG PO TABS: 650.0000 mg | ORAL_TABLET | Freq: Four times a day (QID) | ORAL | Status: DC | PRN

## 2022-03-06 MED ORDER — SODIUM CHLORIDE 0.9 % IV SOLN: INTRAVENOUS | Status: AC

## 2022-03-06 MED ORDER — KETOTIFEN FUMARATE 0.025 % OP SOLN
1.0000 [drp] | Freq: Every day | OPHTHALMIC | Status: DC
  Administered 2022-03-07: 1 [drp] via OPHTHALMIC
  Filled 2022-03-06: qty 5

## 2022-03-06 MED ORDER — MELATONIN 3 MG PO TABS
3.0000 mg | ORAL_TABLET | Freq: Every day | ORAL | Status: DC
  Administered 2022-03-06: 3 mg via ORAL
  Filled 2022-03-06: qty 1

## 2022-03-06 MED ORDER — TERAZOSIN HCL 5 MG PO CAPS
10.0000 mg | ORAL_CAPSULE | Freq: Every day | ORAL | Status: DC
  Administered 2022-03-06: 10 mg via ORAL
  Filled 2022-03-06: qty 2

## 2022-03-06 MED ORDER — GUAIFENESIN ER 600 MG PO TB12
600.0000 mg | ORAL_TABLET | Freq: Two times a day (BID) | ORAL | Status: DC
  Administered 2022-03-06: 600 mg via ORAL
  Filled 2022-03-06: qty 1

## 2022-03-06 MED ORDER — AMLODIPINE BESYLATE 5 MG PO TABS
5.0000 mg | ORAL_TABLET | Freq: Every day | ORAL | Status: DC
  Administered 2022-03-06 – 2022-03-07 (×2): 5 mg via ORAL
  Filled 2022-03-06 (×2): qty 1

## 2022-03-06 MED ORDER — APIXABAN 5 MG PO TABS
5.0000 mg | ORAL_TABLET | Freq: Two times a day (BID) | ORAL | Status: DC
  Administered 2022-03-06 – 2022-03-07 (×3): 5 mg via ORAL
  Filled 2022-03-06 (×3): qty 1

## 2022-03-06 MED ORDER — PANTOPRAZOLE SODIUM 40 MG PO TBEC
40.0000 mg | DELAYED_RELEASE_TABLET | Freq: Every day | ORAL | Status: DC
  Administered 2022-03-06 – 2022-03-07 (×2): 40 mg via ORAL
  Filled 2022-03-06 (×2): qty 1

## 2022-03-06 NOTE — Assessment & Plan Note (Addendum)
Treated with fluids. Oral intake now adequate ?

## 2022-03-06 NOTE — Progress Notes (Signed)
SATURATION QUALIFICATIONS: (This note is used to comply with regulatory documentation for home oxygen) ? ?Patient Saturations on Room Air at Rest =  96% ? ?Patient Saturations on Room Air while Ambulating = 95% ? ?Patient Saturations on 0 Liters of oxygen while Ambulating = 95% ? ? ?

## 2022-03-06 NOTE — H&P (Signed)
History and Physical    Jared Congrove, PhD GEZ:[id number] DOB: July 11, 1929 DOA: 03/05/2022  DOS: the patient was seen and examined on 03/05/2022  PCP: [name], MD   Patient coming from: [place] [facility]  I have personally briefly reviewed patient's old medical records in [facility]  Patient is a 88 year old male with a past medical history of paroxysmal A-fib on Eliquis, BPH, COPD, GERD, hypertension, meningioma presented to the ED with complaints of fever and cough.  On arrival to the ED, patient febrile with temperature 100.2 F.  Labs showing no leukocytosis.  Hemoglobin 12.4, MCV 83.8.  Platelet count 120k.  COVID and influenza PCR negative.  Chest x-ray showing subtle hazy patchy density over the left mid to lower lung concerning for early infection. Patient was given ceftriaxone and azithromycin.  Patient reports >1 week history of productive cough, rhinorrhea, sneezing, and poor appetite.  Having fevers for the past 2 days.  Denies shortness of breath or chest pain.  Denies nausea, vomiting, abdominal pain, diarrhea, or dysuria.  No other complaints.   Review of Systems:  Review of Systems  All other systems reviewed and are negative.  Past Medical History:  Diagnosis Date   Acute, but ill-defined, cerebrovascular disease 04/07/2014   Adenomatous colon polyp    Allergy    Atrial fibrillation (HCC)    BPH (benign prostatic hyperplasia)    Cataract    removed both eyes    COPD (chronic obstructive pulmonary disease) (HCC)    Eczema    Erectile dysfunction    Gallstones    GERD (gastroesophageal reflux disease)    Heart murmur    HOH (hard of hearing)    HTN (hypertension)    Meningioma (HCC)    Right frontal   Meralgia paraesthetica    Palpitations    Urinary retention     Past Surgical History:  Procedure Laterality Date   BLADDER DIVERTICULECTOMY  02/15/2003   Archie Endo 05/16/2011   CARDIAC CATHETERIZATION     CARDIOVERSION N/A 06/17/2020   Procedure: CARDIOVERSION;   Surgeon: [name], MD;  Location: [facility];  Service: Cardiovascular;  Laterality: N/A;   CATARACT EXTRACTION Bilateral 2013   CHOLECYSTECTOMY N/A 11/19/2014   Procedure: LAPAROSCOPIC CHOLECYSTECTOMY WITH INTRAOPERATIVE CHOLANGIOGRAM;  Surgeon: [name], MD;  Location: [facility];  Service: General;  Laterality: N/A;   COLONOSCOPY     COLONOSCOPY W/ POLYPECTOMY     [place] Left    INGUINAL HERNIA REPAIR Right 11/2009   LAPAROSCOPIC CHOLECYSTECTOMY  11/19/2014   w/IOC   MEATOTOMY  06/2003   Distal urethral meatotomy with YAG laser/notes 05/16/2011   POLYPECTOMY     SUPRAPUBIC PROSTATECTOMY  02/15/2003   Archie Endo 05/16/2011     reports that he quit smoking about 22 years ago. His smoking use included cigarettes. He has a 8.00 pack-year smoking history. He has never used smokeless tobacco. He reports current alcohol use of about 14.0 standard drinks per week. He reports that he does not use drugs.  Allergies  Allergen Reactions   Aspirin     GI Bleeding   Procardia [Nifedipine] Other (See Comments)    Headaches     Family History  Problem Relation Age of Onset   Alzheimer's disease Mother    Heart attack Father    Cancer - Lung Sister    Colon cancer Neg Hx    Colon polyps Neg Hx    Esophageal cancer Neg Hx  Rectal cancer Neg Hx    Stomach cancer Neg Hx     Prior to Admission medications   Medication Sig Start Date End Date Taking? Authorizing Provider  acetaminophen (TYLENOL) 500 MG tablet Take 500 mg by mouth every 6 (six) hours as needed.    [provider]  amLODipine (NORVASC) 5 MG tablet Take 5 mg by mouth daily. 10/12/20   [provider]  ciclopirox (LOPROX) 0.77 % cream Apply 1 application topically daily. 01/20/20   [provider]  dorzolamide-timolol (COSOPT) 22.3-6.8 MG/ML ophthalmic solution Place 1 drop into the left eye 2 (two) times daily. 11/21/21   [provider]  ELIQUIS 5  MG TABS tablet TAKE 1 TABLET(5 MG) BY MOUTH TWICE DAILY 09/20/21   [name], MD  ketotifen (ZADITOR) 0.025 % ophthalmic solution Place 1 drop into both eyes daily.    [provider]  latanoprost (XALATAN) 0.005 % ophthalmic solution Place 1 drop into both eyes at bedtime. 12/14/19   [provider]  Naproxen Sodium 220 MG CAPS Take 220 mg by mouth daily as needed (pain).    [provider]  OVER THE COUNTER MEDICATION Luna sleeping aid: Take one at bedtime as needed    [provider]  Polyethyl Glycol-Propyl Glycol (SYSTANE OP) Place 1 drop into both eyes daily as needed (dry eyes).    [provider]  terazosin (HYTRIN) 10 MG capsule Take 10 mg by mouth at bedtime.  11/21/12   [provider]  triamcinolone cream (KENALOG) 0.1 % Apply 1 application topically daily as needed (dry and itching skin).  02/09/20   [provider]    Physical Exam: Vitals:   03/05/22 2315 03/05/22 2345 03/06/22 0000 03/06/22 0045  BP: (!) 141/71 131/76 135/73 (!) 147/82  Pulse: 99 79 82 65  Resp: (!) 22 20 (!) 25   Temp:   (!) 100.8 F (38.2 C) 98.2 F (36.8 C)  TempSrc:   Oral Oral  SpO2: 96% 96% 96% 99%  Weight:      Height:        Physical Exam Vitals and nursing note reviewed.  Constitutional:      General: He is not in acute distress. HENT:     Head: Normocephalic and atraumatic.     Mouth/Throat:     Mouth: Mucous membranes are dry.  Eyes:     Extraocular Movements: Extraocular movements intact.     Conjunctiva/sclera: Conjunctivae normal.  Cardiovascular:     Rate and Rhythm: Normal rate and regular rhythm.     Pulses: Normal pulses.  Pulmonary:     Effort: Pulmonary effort is normal. No respiratory distress.     Breath sounds: No wheezing or rales.  Abdominal:     General: Bowel sounds are normal. There is no distension.     Palpations: Abdomen is soft.     Tenderness: There is no abdominal tenderness.   Musculoskeletal:        General: No swelling or tenderness.     Cervical back: Normal range of motion.  Skin:    General: Skin is warm and dry.  Neurological:     General: No focal deficit present.     Mental Status: He is alert and oriented to person, place, and time.     Labs on Admission: I have personally reviewed following labs and imaging studies  CBC: Recent Labs  Lab 03/05/22 2100  WBC 9.2  NEUTROABS 4.9  HGB 12.4*  HCT 38.4*  MCV 83.8  PLT 734*   Basic Metabolic Panel: Recent Labs  Lab 03/05/22 2100  NA 136  K 3.7  CL 104  CO2 22  GLUCOSE 125*  BUN 34*  CREATININE 1.07  CALCIUM 8.6*   GFR: Estimated Creatinine Clearance: 44.8 mL/min (by C-G formula based on SCr of 1.07 mg/dL). Liver Function Tests: No results for input(s): AST, ALT, ALKPHOS, BILITOT, PROT, ALBUMIN in the last 168 hours. No results for input(s): LIPASE, AMYLASE in the last 168 hours. No results for input(s): AMMONIA in the last 168 hours. Coagulation Profile: No results for input(s): INR, PROTIME in the last 168 hours. Cardiac Enzymes: No results for input(s): CKTOTAL, CKMB, CKMBINDEX, TROPONINI in the last 168 hours. BNP (last 3 results) No results for input(s): PROBNP in the last 8760 hours. HbA1C: No results for input(s): HGBA1C in the last 72 hours. CBG: No results for input(s): GLUCAP in the last 168 hours. Lipid Profile: No results for input(s): CHOL, HDL, LDLCALC, TRIG, CHOLHDL, LDLDIRECT in the last 72 hours. Thyroid Function Tests: No results for input(s): TSH, T4TOTAL, FREET4, T3FREE, THYROIDAB in the last 72 hours. Anemia Panel: No results for input(s): VITAMINB12, FOLATE, FERRITIN, TIBC, IRON, RETICCTPCT in the last 72 hours. Urine analysis:    Component Value Date/Time   BILIRUBINUR neg 09/07/2014 1101   PROTEINUR neg 09/07/2014 1101   UROBILINOGEN 0.2 09/07/2014 1101   NITRITE neg 09/07/2014 1101   LEUKOCYTESUR Negative 09/07/2014 1101    Radiological Exams  on Admission: I have personally reviewed images DG Chest 2 View  Result Date: 03/05/2022 CLINICAL DATA:  Fever and cough. EXAM: CHEST - 2 VIEW COMPARISON:  11/04/2018 FINDINGS: Subtle hazy patchy density over the left mid to lower lung which could be due to early infection. Increased density along the major fissure on the lateral film suggesting mild atelectasis. No significant effusion. Cardiomediastinal silhouette and remainder of the exam is unchanged. IMPRESSION: Subtle hazy patchy density over the left mid to lower lung which could be due to early infection. Electronically Signed   By: Marin Olp M.D.   On: 03/05/2022 18:11    EKG: I have personally reviewed EKG: A-fib, ventricular bigeminy.  T wave abnormalities in lateral leads seen on prior tracing as well.  Assessment/Plan Principal Problem:   Community acquired pneumonia Active Problems:   HTN (hypertension)   COPD (chronic obstructive pulmonary disease) (HCC)   Paroxysmal A-fib (HCC)   Dehydration    Assessment and Plan: * Community acquired pneumonia Patient presenting with complaints of fever and productive cough.  Chest x-ray showing subtle hazy patchy density over the left mid to lower lung concerning for early infection.  Febrile but no leukocytosis or other signs of sepsis.  Not hypoxic. -Continue ceftriaxone and azithromycin.  Mucinex, Tylenol as needed.  Strep pneumo and Legionella urinary antigens.  Continuous pulse ox, supplemental oxygen as needed.  Dehydration BUN elevated and has dry mucous membranes on exam.  Endorsing poor oral intake. -IV fluid hydration  Paroxysmal A-fib (HCC) EKG showing rate controlled A-fib and ventricular bigeminy.  T wave abnormality in lateral leads is not new. -Cardiac monitoring.  Pharmacy med rec pending.  COPD (chronic obstructive pulmonary disease) (HCC) Stable, not wheezing. -Pharmacy med rec pending.  HTN (hypertension) Stable. -Pharmacy med rec pending.   DVT  prophylaxis:  Resume Eliquis after pharmacy med rec is done. Code Status: Full Code (discussed with patient) Family Communication: Patient's male friend/significant other at bedside. Admission status: Observation, Telemetry bed It is my clinical  opinion that referral for OBSERVATION is reasonable and necessary in this patient based on the above information provided. The aforementioned taken together are felt to place the patient at high risk for further clinical deterioration. However, it is anticipated that the patient may be medically stable for discharge from the hospital within 24 to 48 hours.   Shela Leff, MD [facility] 03/06/2022, 3:54 AM

## 2022-03-06 NOTE — Assessment & Plan Note (Addendum)
Stable, Occasional  wheezing. ?Will add prednisone and combivent. Recommendation is for further work up once better ?

## 2022-03-06 NOTE — Progress Notes (Signed)
?   03/06/22 1400  ?Oxygen Therapy  ?SpO2 95 %  ?O2 Device Room Air  ?Patient Activity (if Appropriate) Ambulating  ?Mobility  ?Activity Ambulated with assistance in hallway  ?Level of Assistance Modified independent, requires aide device or extra time  ?Assistive Device None  ?Distance Ambulated (ft) 450 ft  ?Activity Response Tolerated well  ?$Mobility charge 1 Mobility  ? ?Pt agreeable to mobilize this afternoon. Ambulated about 452f in hall with no AD, tolerated well. No complaints. Left pt in bed, call bell at side. RN/NT notified of session.  ? ?KRudell Cobb ?Mobility Specialist ?Acute Rehab Services ?Office: [phone]? ? ?

## 2022-03-06 NOTE — Progress Notes (Signed)
? ?  Patient seen and examined at bedside, patient admitted after midnight, please see earlier detailed admission note by Shela Leff, MD. Briefly, patient presented secondary to fever and cough. Patient found to have community acquired pneumonia. Empiric antibiotics initiated. No hypoxia ? ?Subjective: ?Patient reports continued productive cough, although phlegm is clearing up. No dyspnea ? ?BP 138/78 (BP Location: Right Arm)   Pulse 75   Temp 98.7 ?F (37.1 ?C) (Oral)   Resp 18   Ht '5\' 11"'$  (1.803 m)   Wt 70.4 kg   SpO2 96%   BMI 21.65 kg/m?  ? ?General exam: Appears calm and comfortable ?Respiratory system: Rales noted bilaterally, L>R. Respiratory effort normal. ?Cardiovascular system: S1 & S2 heard, normal rate with irregular rhythm ?Gastrointestinal system: Abdomen is nondistended, soft and nontender. No organomegaly or masses felt. Normal bowel sounds heard. ?Central nervous system: Alert and oriented. No focal neurological deficits. ?Musculoskeletal: No edema. No calf tenderness ?Skin: No cyanosis. No rashes ?Psychiatry: Judgement and insight appear normal. Mood & affect appropriate.  ? ?Brief assessment/Plan: ? ?Community acquired pneumonia ?No blood cultures obtained on admission. ?-Continue antibiotics ?-Add flutter valve, increase to Mucinex 1200 mg BID ?-Check sputum culture ?-Trend fever curve ?-Ambulatory pulse oximetry check prior to discharge ? ?Glaucoma ?-Add home latanoprost and Cosopt ? ?Family communication: Friend/HCPOA at bedside ?DVT prophylaxis: Eliquis ?Disposition: Discharge home likely in 1-2 days pending continued improvement, transition to oral antibiotics ? ?Cordelia Poche, MD ?[facility] ?03/06/2022, 11:01 AM ?

## 2022-03-06 NOTE — Subjective & Objective (Addendum)
Patient is a 88 year old male with a past medical history of paroxysmal A-fib on Eliquis, BPH, COPD, GERD, hypertension, meningioma presented to the ED with complaints of fever and cough.  On arrival to the ED, patient febrile with temperature 100.2 F.  Labs showing no leukocytosis.  Hemoglobin 12.4, MCV 83.8.  Platelet count 120k.  COVID and influenza PCR negative.  Chest x-ray showing subtle hazy patchy density over the left mid to lower lung concerning for early infection. Patient was given ceftriaxone and azithromycin.  Patient reports >1 week history of productive cough, rhinorrhea, sneezing, and poor appetite.  Having fevers for the past 2 days.  Denies shortness of breath or chest pain.  Denies nausea, vomiting, abdominal pain, diarrhea, or dysuria.  No other complaints.

## 2022-03-06 NOTE — Progress Notes (Signed)
?  Transition of Care (TOC) Screening Note ? ? ?Patient Details  ?Name: [name], PhD ?Date of Birth: 09-13-1929 ? ? ?Transition of Care (TOC) CM/SW Contact:    ?Naheem Mosco, LCSW ?Phone Number: ?03/06/2022, 12:39 PM ? ? ? ?Transition of Care Department [facility]) has reviewed patient and no TOC needs have been identified at this time. We will continue to monitor patient advancement through interdisciplinary progression rounds. If new patient transition needs arise, please place a TOC consult. ? ? ?

## 2022-03-06 NOTE — Assessment & Plan Note (Addendum)
Stable

## 2022-03-06 NOTE — ED Notes (Signed)
[facility] arrived to transport pt. Pt stable at time of departure ?

## 2022-03-06 NOTE — Assessment & Plan Note (Addendum)
Patient presenting with complaints of fever and productive cough. ?Chest x-ray showing subtle hazy patchy density over the left mid to lower lung concerning for early infection.  Febrile but no leukocytosis or other signs of sepsis.  Not hypoxic on ambulation. ?-Continue omnicef and azithromycin.  Mucinex, Tylenol as needed. ?Negative  Strep pneumo and Legionella urinary antigens. ?Add prednisone and combivent ?

## 2022-03-06 NOTE — Assessment & Plan Note (Addendum)
EKG showing rate controlled A-fib and ventricular bigeminy.   ?T wave abnormality in lateral leads is not new. ?Continue current regimen. ?

## 2022-03-07 LAB — LEGIONELLA PNEUMOPHILA SEROGP 1 UR AG: L. pneumophila Serogp 1 Ur Ag: NEGATIVE

## 2022-03-07 MED ORDER — IPRATROPIUM-ALBUTEROL 0.5-2.5 (3) MG/3ML IN SOLN
3.0000 mL | Freq: Four times a day (QID) | RESPIRATORY_TRACT | Status: DC
  Administered 2022-03-07: 3 mL via RESPIRATORY_TRACT
  Filled 2022-03-07: qty 3

## 2022-03-07 MED ORDER — IPRATROPIUM-ALBUTEROL 20-100 MCG/ACT IN AERS: 1.0000 | INHALATION_SPRAY | Freq: Four times a day (QID) | RESPIRATORY_TRACT | Status: DC

## 2022-03-07 MED ORDER — PREDNISONE 5 MG PO TABS
50.0000 mg | ORAL_TABLET | Freq: Every day | ORAL | Status: DC
  Administered 2022-03-07: 50 mg via ORAL
  Filled 2022-03-07: qty 2

## 2022-03-07 MED ORDER — IPRATROPIUM-ALBUTEROL 20-100 MCG/ACT IN AERS
1.0000 | INHALATION_SPRAY | Freq: Four times a day (QID) | RESPIRATORY_TRACT | 0 refills | Status: DC
Start: 2022-03-07 — End: 2023-06-14

## 2022-03-07 MED ORDER — AZITHROMYCIN 500 MG PO TABS: 500.0000 mg | ORAL_TABLET | Freq: Every day | ORAL | 0 refills | Status: AC

## 2022-03-07 MED ORDER — CEFDINIR 300 MG PO CAPS: 300.0000 mg | ORAL_CAPSULE | Freq: Two times a day (BID) | ORAL | 0 refills | Status: AC

## 2022-03-07 MED ORDER — DM-GUAIFENESIN ER 30-600 MG PO TB12: 1.0000 | ORAL_TABLET | Freq: Two times a day (BID) | ORAL | Status: DC

## 2022-03-07 MED ORDER — PREDNISONE 10 MG PO TABS: ORAL_TABLET | ORAL | 0 refills | Status: AC

## 2022-03-07 MED ORDER — IPRATROPIUM-ALBUTEROL 0.5-2.5 (3) MG/3ML IN SOLN: 3.0000 mL | Freq: Three times a day (TID) | RESPIRATORY_TRACT | Status: DC

## 2022-03-07 MED ORDER — DM-GUAIFENESIN ER 30-600 MG PO TB12: 1.0000 | ORAL_TABLET | Freq: Two times a day (BID) | ORAL | 0 refills | Status: AC

## 2022-03-07 MED ORDER — CEFDINIR 300 MG PO CAPS
300.0000 mg | ORAL_CAPSULE | Freq: Two times a day (BID) | ORAL | Status: DC
  Administered 2022-03-07: 300 mg via ORAL
  Filled 2022-03-07: qty 1

## 2022-03-07 NOTE — Progress Notes (Signed)
Mobility Specialist - Progress Note ? ? ? 03/07/22 1030  ?Mobility  ?Activity Ambulated independently in hallway  ?Level of Assistance Independent after set-up  ?Assistive Device Other (Comment) ?(IV Pole)  ?Distance Ambulated (ft) 700 ft  ?Activity Response Tolerated well  ?$Mobility charge 1 Mobility  ? ?Pt agreeable to mobilize this morning, no AD required to ambulate 700 ft. Pt tolerated session well, no c/o of pain, dizziness, or SOB. Pt only noted persisted coughing. Pt returned to room after session and was left sitting up in bed with call bell at side and family in room.  ? ?Tasheba Henson S. ?Mobility Specialist ?Acute Rehabilitation Services ?Phone: [phone] ?03/07/22, 10:32 AM ? ? ? ? ?

## 2022-03-07 NOTE — Plan of Care (Signed)
Instructions were reviewed with patient. All questions were answered. Patient was transported to main entrance by wheelchair. ° °

## 2022-03-08 NOTE — Discharge Summary (Signed)
Physician Discharge Summary   Patient: Jared Blount, PhD MRN: [id number] DOB: 1929/09/16  Admit date:     03/05/2022  Discharge date: 03/07/2022  Discharge Physician: Berle Mull  PCP: [name], MD  Recommendations at discharge:  Follow up with PCP as recommended  Discharge Diagnoses: Principal Problem:   Community acquired pneumonia Active Problems:   COPD (chronic obstructive pulmonary disease) (HCC)   HTN (hypertension)   Paroxysmal A-fib ([place])   Dehydration   Hospital Course: No notes on file  Assessment and Plan: * Community acquired pneumonia Patient presenting with complaints of fever and productive cough. Chest x-ray showing subtle hazy patchy density over the left mid to lower lung concerning for early infection.  Febrile but no leukocytosis or other signs of sepsis.  Not hypoxic on ambulation. -Continue omnicef and azithromycin.  Mucinex, Tylenol as needed. Negative  Strep pneumo and Legionella urinary antigens. Add prednisone and combivent  COPD (chronic obstructive pulmonary disease) (HCC) Stable, Occasional  wheezing. Will add prednisone and combivent. Recommendation is for further work up once better  HTN (hypertension) Stable  Paroxysmal A-fib ([place]) EKG showing rate controlled A-fib and ventricular bigeminy.   T wave abnormality in lateral leads is not new. Continue current regimen.  Dehydration Treated with fluids. Oral intake now adequate  Consultants: none Procedures performed:  none DISCHARGE MEDICATION: Allergies as of 03/07/2022       Reactions   Aspirin    GI Bleeding Other reaction(s): upset stomach   Bee Venom    Other reaction(s): Unknown   Tamsulosin    Other reaction(s): increased frequency   Nifedipine Other (See Comments)   Headaches Other reaction(s): headaches        Medication List     TAKE these medications    acetaminophen 500 MG tablet Commonly known as: TYLENOL Take 500 mg by mouth every 6 (six) hours as  needed for mild pain, moderate pain or fever.   amLODipine 5 MG tablet Commonly known as: NORVASC Take 5 mg by mouth daily.   azithromycin 500 MG tablet Commonly known as: ZITHROMAX Take 1 tablet (500 mg total) by mouth daily for 3 days.   cefdinir 300 MG capsule Commonly known as: OMNICEF Take 1 capsule (300 mg total) by mouth 2 (two) times daily for 3 days.   ciclopirox 0.77 % cream Commonly known as: LOPROX Apply 1 application topically daily.   dextromethorphan-guaiFENesin 30-600 MG 12hr tablet Commonly known as: MUCINEX DM Take 1 tablet by mouth 2 (two) times daily for 15 days.   dorzolamide-timolol 22.3-6.8 MG/ML ophthalmic solution Commonly known as: COSOPT Place 1 drop into the left eye 2 (two) times daily.   Eliquis 5 MG Tabs tablet Generic drug: apixaban TAKE 1 TABLET(5 MG) BY MOUTH TWICE DAILY What changed: See the new instructions.   esomeprazole 20 MG capsule Commonly known as: NEXIUM Take 20 mg by mouth daily.   Ipratropium-Albuterol 20-100 MCG/ACT Aers respimat Commonly known as: COMBIVENT Inhale 1 puff into the lungs every 6 (six) hours for 7 days.   ketotifen 0.025 % ophthalmic solution Commonly known as: ZADITOR Place 1 drop into both eyes daily.   latanoprost 0.005 % ophthalmic solution Commonly known as: XALATAN Place 1 drop into both eyes at bedtime.   MELATONIN PO Take 1 tablet by mouth at bedtime.   OVER THE COUNTER MEDICATION Luna sleeping aid: Take one at bedtime as needed   predniSONE 10 MG tablet Commonly known as: DELTASONE Take '40mg'$  daily for 1 day,Take '30mg'$  daily for  1 day,Take '20mg'$  daily for 1 day, Take '10mg'$  daily for 1 day, then stop   SYSTANE OP Place 1 drop into both eyes daily as needed (dry eyes).   terazosin 10 MG capsule Commonly known as: HYTRIN Take 10 mg by mouth at bedtime.   triamcinolone cream 0.1 % Commonly known as: KENALOG Apply 1 application topically daily as needed (dry and itching skin).         Follow-up Information     [name], MD. Schedule an appointment as soon as possible for a visit in 1 week(s).   Specialty: Internal Medicine Contact information: [address] [place] [address] [phone]                Disposition: Home Diet recommendation:  Discharge Diet Orders (From admission, onward)     Start     Ordered   03/07/22 0000  Diet - low sodium heart healthy        03/07/22 1040           Discharge Exam: Filed Weights   03/05/22 1743  Weight: 70.4 kg   General: Appear in mild distress; no visible Abnormal Neck Mass Or lumps, Conjunctiva normal Cardiovascular: S1 and S2 Present, no Murmur, Respiratory: good respiratory effort, Bilateral Air entry present and  bilateral Crackles, Occasional  wheezes Abdomen: Bowel Sound present no tenderness Extremities: no Pedal edema Neurology: alert and oriented to time, place, and person Gait not checked due to patient safety concerns   Condition at discharge: good  The results of significant diagnostics from this hospitalization (including imaging, microbiology, ancillary and laboratory) are listed below for reference.   Imaging Studies: DG Chest 2 View  Result Date: 03/05/2022 CLINICAL DATA:  Fever and cough. EXAM: CHEST - 2 VIEW COMPARISON:  11/04/2018 FINDINGS: Subtle hazy patchy density over the left mid to lower lung which could be due to early infection. Increased density along the major fissure on the lateral film suggesting mild atelectasis. No significant effusion. Cardiomediastinal silhouette and remainder of the exam is unchanged. IMPRESSION: Subtle hazy patchy density over the left mid to lower lung which could be due to early infection. Electronically Signed   By: Marin Olp M.D.   On: 03/05/2022 18:11    Microbiology: Results for orders placed or performed during the hospital encounter of 03/05/22  Resp Panel by RT-PCR (Flu A&B, Covid) Nasopharyngeal Swab     Status:  None   Collection Time: 03/05/22  5:49 PM   Specimen: Nasopharyngeal Swab; Nasopharyngeal(NP) swabs in vial transport medium  Result Value Ref Range Status   SARS Coronavirus 2 by RT PCR NEGATIVE NEGATIVE Final    Comment: (NOTE) SARS-CoV-2 target nucleic acids are NOT DETECTED.  The SARS-CoV-2 RNA is generally detectable in upper respiratory specimens during the acute phase of infection. The lowest concentration of SARS-CoV-2 viral copies this assay can detect is 138 copies/mL. A negative result does not preclude SARS-Cov-2 infection and should not be used as the sole basis for treatment or other patient management decisions. A negative result may occur with  improper specimen collection/handling, submission of specimen other than nasopharyngeal swab, presence of viral mutation(s) within the areas targeted by this assay, and inadequate number of viral copies(<138 copies/mL). A negative result must be combined with clinical observations, patient history, and epidemiological information. The expected result is Negative.  Fact Sheet for Patients:  EntrepreneurPulse.com.au  Fact Sheet for Healthcare Providers:  IncredibleEmployment.be  This test is no t yet approved or  cleared by the [place] and  has been authorized for detection and/or diagnosis of SARS-CoV-2 by FDA under an Emergency Use Authorization (EUA). This EUA will remain  in effect (meaning this test can be used) for the duration of the COVID-19 declaration under Section 564(b)(1) of the Act, 21 U.S.C.section 360bbb-3(b)(1), unless the authorization is terminated  or revoked sooner.       Influenza A by PCR NEGATIVE NEGATIVE Final   Influenza B by PCR NEGATIVE NEGATIVE Final    Comment: (NOTE) The Xpert Xpress SARS-CoV-2/FLU/RSV plus assay is intended as an aid in the diagnosis of influenza from Nasopharyngeal swab specimens and should not be used as a sole basis for  treatment. Nasal washings and aspirates are unacceptable for Xpert Xpress SARS-CoV-2/FLU/RSV testing.  Fact Sheet for Patients: EntrepreneurPulse.com.au  Fact Sheet for Healthcare Providers: IncredibleEmployment.be  This test is not yet approved or cleared by the [place] FDA and has been authorized for detection and/or diagnosis of SARS-CoV-2 by FDA under an Emergency Use Authorization (EUA). This EUA will remain in effect (meaning this test can be used) for the duration of the COVID-19 declaration under Section 564(b)(1) of the Act, 21 U.S.C. section 360bbb-3(b)(1), unless the authorization is terminated or revoked.  Performed at [facility], [address], [place], [place] [address]   Expectorated Sputum Assessment w Gram Stain, Rflx to Resp Cult     Status: None   Collection Time: 03/06/22  5:10 PM   Specimen: Sputum  Result Value Ref Range Status   Specimen Description SPU EXPECTORATED  Final   Special Requests NONE  Final   Sputum evaluation   Final    THIS SPECIMEN IS ACCEPTABLE FOR SPUTUM CULTURE Performed at [facility], [place] [address] [place], [place] [address]    Report Status 03/06/2022 FINAL  Final  Culture, Respiratory w Gram Stain     Status: None (Preliminary result)   Collection Time: 03/06/22  5:10 PM   Specimen: Sputum  Result Value Ref Range Status   Specimen Description   Final    SPU EXPECTORATED Performed at [facility] [address] [place], [place] [address]    Special Requests   Final    NONE Reflexed from [id number] Performed at [facility] [address], [place], [place] [address]    Gram Stain   Final    ABUNDANT WBC PRESENT,BOTH PMN AND MONONUCLEAR MODERATE GRAM VARIABLE ROD FEW GRAM POSITIVE COCCI    Culture   Final    FEW Normal respiratory flora-no Staph aureus or Pseudomonas seen Performed at [facility], [address] [place], [place] [address]    Report Status PENDING  Incomplete    Labs: CBC: Recent Labs  Lab 03/05/22 2100  WBC 9.2  NEUTROABS 4.9  HGB 12.4*  HCT 38.4*  MCV 83.8  PLT 947*   Basic Metabolic Panel: Recent Labs  Lab 03/05/22 2100  NA 136  K 3.7  CL 104  CO2 22  GLUCOSE 125*  BUN 34*  CREATININE 1.07  CALCIUM 8.6*   Liver Function Tests: No results for input(s): AST, ALT, ALKPHOS, BILITOT, PROT, ALBUMIN in the last 168 hours. CBG: No results for input(s): GLUCAP in the last 168 hours.  Discharge time spent: greater than 30 minutes.  Signed: Berle Mull, MD [facility] 03/07/2022

## 2022-03-09 LAB — CULTURE, RESPIRATORY W GRAM STAIN: Culture: NORMAL

## 2022-03-14 ENCOUNTER — Other Ambulatory Visit: Payer: Self-pay | Admitting: Cardiovascular Disease

## 2022-03-14 DIAGNOSIS — I10 Essential (primary) hypertension: Secondary | ICD-10-CM

## 2022-03-14 DIAGNOSIS — R9431 Abnormal electrocardiogram [ECG] [EKG]: Secondary | ICD-10-CM

## 2022-03-14 NOTE — Telephone Encounter (Signed)
Prescription refill request for Eliquis received. ?Indication: Afib  ?Last office visit: 12/09/22 Jared Neal)  ?Scr: 1.07 (03/05/22) ?Age: 88 ?Weight: 70.4kg ? ?Appropriate dose and refill sent to requested pharmacy.  ?

## 2022-03-15 DIAGNOSIS — J159 Unspecified bacterial pneumonia: Secondary | ICD-10-CM | POA: Diagnosis not present

## 2022-03-15 DIAGNOSIS — Z8616 Personal history of COVID-19: Secondary | ICD-10-CM | POA: Diagnosis not present

## 2022-03-15 DIAGNOSIS — B379 Candidiasis, unspecified: Secondary | ICD-10-CM | POA: Diagnosis not present

## 2022-03-15 DIAGNOSIS — Z23 Encounter for immunization: Secondary | ICD-10-CM | POA: Diagnosis not present

## 2022-03-15 DIAGNOSIS — Z09 Encounter for follow-up examination after completed treatment for conditions other than malignant neoplasm: Secondary | ICD-10-CM | POA: Diagnosis not present

## 2022-03-30 DIAGNOSIS — I10 Essential (primary) hypertension: Secondary | ICD-10-CM | POA: Diagnosis not present

## 2022-03-30 DIAGNOSIS — J449 Chronic obstructive pulmonary disease, unspecified: Secondary | ICD-10-CM | POA: Diagnosis not present

## 2022-03-30 DIAGNOSIS — K219 Gastro-esophageal reflux disease without esophagitis: Secondary | ICD-10-CM | POA: Diagnosis not present

## 2022-05-29 DIAGNOSIS — H401111 Primary open-angle glaucoma, right eye, mild stage: Secondary | ICD-10-CM | POA: Diagnosis not present

## 2022-05-29 DIAGNOSIS — H401122 Primary open-angle glaucoma, left eye, moderate stage: Secondary | ICD-10-CM | POA: Diagnosis not present

## 2022-06-01 NOTE — Progress Notes (Signed)
Date:  06/08/2022   Jared Saxon, PhD Date of Birth: 12-11-1929 Medical Record #[id number]  PCP:  Jared Pretty, MD  Cardiologist:  Jared Neal  History of Present Illness:  88 y.o. with history of HTN, COPD, GERD. Chronically abnormal ECG Did not tolerated Procardia for his BP.  Seen by NP 01/11/20 found to be in afib Started on eliquis and had [facility] 06/17/20. Tends to be bradycardic in sinus And is not Rx with AV nodal drugs currently At some point reverted back to asymptomatic afib and rate control adopted   Held his DOAC for colonoscopy and EGD with Dr Fuller Plan 10/10/20 and had multiple polyps removed    Walks everyday at [employer] Has a little Yorkie named [place]   03/05/22 Hospitalized with COPD/Pneumonia Rx with Omnicef and Azithromycin   Seems to regret being as old as he is but still active   Past Medical History:  Diagnosis Date   Acute, but ill-defined, cerebrovascular disease 04/07/2014   Adenomatous colon polyp    Allergy    Atrial fibrillation (HCC)    BPH (benign prostatic hyperplasia)    Cataract    removed both eyes    COPD (chronic obstructive pulmonary disease) (HCC)    Eczema    Erectile dysfunction    Gallstones    GERD (gastroesophageal reflux disease)    Heart murmur    HOH (hard of hearing)    HTN (hypertension)    Meningioma ([place])    Right frontal   Meralgia paraesthetica    Palpitations    Urinary retention     Past Surgical History:  Procedure Laterality Date   BLADDER DIVERTICULECTOMY  02/15/2003   Archie Endo 05/16/2011   CARDIAC CATHETERIZATION     CARDIOVERSION N/A 06/17/2020   Procedure: CARDIOVERSION;  Surgeon: Jared Hector, MD;  Location: [facility];  Service: Cardiovascular;  Laterality: N/A;   CATARACT EXTRACTION Bilateral 2013   CHOLECYSTECTOMY N/A 11/19/2014   Procedure: LAPAROSCOPIC CHOLECYSTECTOMY WITH INTRAOPERATIVE CHOLANGIOGRAM;  Surgeon: Jared Confer, MD;  Location: [facility];  Service: General;   Laterality: N/A;   COLONOSCOPY     COLONOSCOPY W/ POLYPECTOMY     [place] Left    INGUINAL HERNIA REPAIR Right 11/2009   LAPAROSCOPIC CHOLECYSTECTOMY  11/19/2014   w/IOC   MEATOTOMY  06/2003   Distal urethral meatotomy with YAG laser/notes 05/16/2011   POLYPECTOMY     SUPRAPUBIC PROSTATECTOMY  02/15/2003   Archie Endo 05/16/2011     Medications: No outpatient medications have been marked as taking for the 06/08/22 encounter (Office Visit) with Jared Hector, MD.     Allergies: Allergies  Allergen Reactions   Aspirin     GI Bleeding Other reaction(s): upset stomach   Bee Venom     Other reaction(s): Unknown   Tamsulosin     Other reaction(s): increased frequency   Nifedipine Other (See Comments)    Headaches  Other reaction(s): headaches    Social History: The patient  reports that he quit smoking about 22 years ago. His smoking use included cigarettes. He has a 8.00 pack-year smoking history. He has never used smokeless tobacco. He reports current alcohol use of about 14.0 standard drinks of alcohol per week. He reports that he does not use drugs.   Family History: The patient's family history includes Alzheimer's disease in his mother; Cancer - Lung in his sister; Heart attack in his father.   Review of Systems:  Please see the history of present illness.   All other systems are reviewed and negative.   Physical Exam: VS:  BP 140/80   Pulse 73   Ht '5\' 11"'$  (1.803 m)   Wt 151 lb (68.5 kg)   SpO2 99%   BMI 21.06 kg/m  .  BMI Body mass index is 21.06 kg/m.  Wt Readings from Last 3 Encounters:  06/08/22 151 lb (68.5 kg)  03/05/22 155 lb 3.3 oz (70.4 kg)  12/08/21 155 lb 3.2 oz (70.4 kg)   Affect appropriate Healthy:  appears stated age HEENT: normal Neck supple with no adenopathy JVP normal no bruits no thyromegaly Lungs clear with no wheezing and good diaphragmatic motion Heart:  S1/S2 no murmur, no rub, gallop or click PMI  normal Abdomen: benighn, BS positve, no tenderness, no AAA no bruit.  No HSM or HJR Distal pulses intact with no bruits No edema Neuro non-focal Skin warm and dry No muscular weakness   LABORATORY DATA:  EKG:  03/06/22   afib rate 64 chronic lateral T wave changes   Lab Results  Component Value Date   WBC 9.2 03/05/2022   HGB 12.4 (L) 03/05/2022   HCT 38.4 (L) 03/05/2022   PLT 120 (L) 03/05/2022   GLUCOSE 125 (H) 03/05/2022   ALT 11 06/28/2020   AST 11 06/28/2020   NA 136 03/05/2022   K 3.7 03/05/2022   CL 104 03/05/2022   CREATININE 1.07 03/05/2022   BUN 34 (H) 03/05/2022   CO2 22 03/05/2022   TSH 2.270 01/11/2020   INR 1.14 11/11/2014    Other Studies Reviewed Today:  Echo 08/18/20    1. Left ventricular ejection fraction, by estimation, is 65 to 70%. The  left ventricle has normal function. The left ventricle has no regional  wall motion abnormalities. There is moderate variable left ventricular  hypertrophy of the basal-septal  segment. Left ventricular diastolic parameters are consistent with  age-related delayed relaxation (normal).   2. Right ventricular systolic function is normal. The right ventricular  size is normal. Tricuspid regurgitation signal is inadequate for assessing  PA pressure.   3. Left atrial size was mildly dilated.   4. Right atrial size was mildly dilated.   5. The mitral valve is normal in structure. Mild mitral valve  regurgitation. No evidence of mitral stenosis.   6. The aortic valve is tricuspid. Aortic valve regurgitation is not  visualized. Mild aortic valve sclerosis is present, with no evidence of  aortic valve stenosis.   7. Aortic dilatation noted. There is borderline dilatation of the  ascending aorta measuring 38 mm.   8. The inferior vena cava is normal in size with greater than 50%  respiratory variability, suggesting right atrial pressure of 3 mmHg.      Assessment/Plan:  1. PAF -  [place] done 06/17/20  wit asymptomatic  reversion to afib On Eliquis with no bleeding issues   Weight > 60 kg and Cr normal 03/05/22 Echo 08/18/20 with moderate LVH EF 65-70% mild biatrial enlargement and only mild MR   2. HTN - Well controlled.  Continue current medications and low sodium Dash type diet.     3. Chronically abnormal EKG with lateral T wave changes - active with no angina observe  Given age No indication for stress testing   5. GI:  F/u pathology with Dr Fuller Plan regarding polyps no bleeding issues  Normal bowel habits    Current medicines are reviewed with the patient today.  The patient does not have concerns regarding medicines other than what has been noted above.  The following changes have been made:  None   Labs/ tests ordered today include: None   Disposition:   FU in 6 months   Signed: Jenkins Rouge, MD  06/08/2022 9:38 AM  [facility] [address] [place], [place]  [address] Phone: [phone] Fax: [phone]

## 2022-06-08 ENCOUNTER — Ambulatory Visit (INDEPENDENT_AMBULATORY_CARE_PROVIDER_SITE_OTHER): Payer: Medicare Other | Admitting: Cardiovascular Disease

## 2022-06-08 ENCOUNTER — Encounter: Payer: Self-pay | Admitting: Cardiovascular Disease

## 2022-06-08 VITALS — BP 140/80 | HR 73 | Ht 71.0 in | Wt 151.0 lb

## 2022-06-08 DIAGNOSIS — I10 Essential (primary) hypertension: Secondary | ICD-10-CM

## 2022-06-08 DIAGNOSIS — Z7901 Long term (current) use of anticoagulants: Secondary | ICD-10-CM

## 2022-06-08 DIAGNOSIS — I480 Paroxysmal atrial fibrillation: Secondary | ICD-10-CM

## 2022-06-08 DIAGNOSIS — Z5181 Encounter for therapeutic drug level monitoring: Secondary | ICD-10-CM | POA: Diagnosis not present

## 2022-06-08 NOTE — Patient Instructions (Signed)

## 2022-08-30 DIAGNOSIS — K219 Gastro-esophageal reflux disease without esophagitis: Secondary | ICD-10-CM | POA: Diagnosis not present

## 2022-08-30 DIAGNOSIS — I10 Essential (primary) hypertension: Secondary | ICD-10-CM | POA: Diagnosis not present

## 2022-08-30 DIAGNOSIS — J449 Chronic obstructive pulmonary disease, unspecified: Secondary | ICD-10-CM | POA: Diagnosis not present

## 2022-09-20 ENCOUNTER — Other Ambulatory Visit: Payer: Self-pay | Admitting: Cardiovascular Disease

## 2022-09-20 DIAGNOSIS — I10 Essential (primary) hypertension: Secondary | ICD-10-CM

## 2022-09-20 DIAGNOSIS — R9431 Abnormal electrocardiogram [ECG] [EKG]: Secondary | ICD-10-CM

## 2022-09-21 NOTE — Telephone Encounter (Signed)
Eliquis '5mg'$  refill request received. Patient is 88 years old, weight-68.5kg, Crea-1.07 on 03/05/2022, Diagnosis-Afib, and last seen by Dr Johnsie Cancel on 12/08/2021 & pending appt on 12/10/2022 with Dr Johnsie Cancel. Dose is appropriate based on dosing criteria. Will send in refill to requested pharmacy.

## 2022-09-22 DIAGNOSIS — Z23 Encounter for immunization: Secondary | ICD-10-CM | POA: Diagnosis not present

## 2022-10-18 DIAGNOSIS — Z23 Encounter for immunization: Secondary | ICD-10-CM | POA: Diagnosis not present

## 2022-11-26 NOTE — Progress Notes (Signed)
Date:  12/10/2022   [name], PhD Date of Birth: [birth year] Medical Record #[id number]  PCP:  [name], MD  Cardiologist:  [name]  History of Present Illness:  88 y.o. with history of HTN, COPD, GERD. Chronically abnormal ECG Did not tolerated Procardia for his BP.  Seen by NP 01/11/20 found to be in afib Started on eliquis and had [facility] 06/17/20. Tends to be bradycardic in sinus And is not Rx with AV nodal drugs currently At some point reverted back to asymptomatic afib and rate control adopted   Held his DOAC for colonoscopy and EGD with Dr Fuller Plan 10/10/20 and had multiple polyps removed    Walks everyday at [employer] Has a little Yorkie named [place]   03/05/22 Hospitalized with COPD/Pneumonia Rx with Omnicef and Azithromycin   Seems to regret being as old as he is but still active Hct 43.4  PLT 135  normal Cr 1.2 11/26/22   BP better on higher dose Norvasc    Past Medical History:  Diagnosis Date   Acute, but ill-defined, cerebrovascular disease 04/07/2014   Adenomatous colon polyp    Allergy    Atrial fibrillation (HCC)    BPH (benign prostatic hyperplasia)    Cataract    removed both eyes    COPD (chronic obstructive pulmonary disease) (HCC)    Eczema    Erectile dysfunction    Gallstones    GERD (gastroesophageal reflux disease)    Heart murmur    HOH (hard of hearing)    HTN (hypertension)    Meningioma (HCC)    Right frontal   Meralgia paraesthetica    Palpitations    Urinary retention     Past Surgical History:  Procedure Laterality Date   BLADDER DIVERTICULECTOMY  02/15/2003   Archie Endo 05/16/2011   CARDIAC CATHETERIZATION     CARDIOVERSION N/A 06/17/2020   Procedure: CARDIOVERSION;  Surgeon: [name], MD;  Location: [place];  Service: Cardiovascular;  Laterality: N/A;   CATARACT EXTRACTION Bilateral 2013   CHOLECYSTECTOMY N/A 11/19/2014   Procedure: LAPAROSCOPIC CHOLECYSTECTOMY WITH INTRAOPERATIVE  CHOLANGIOGRAM;  Surgeon: [name], MD;  Location: [place];  Service: General;  Laterality: N/A;   COLONOSCOPY     COLONOSCOPY W/ POLYPECTOMY     [place] Left    INGUINAL HERNIA REPAIR Right 11/2009   LAPAROSCOPIC CHOLECYSTECTOMY  11/19/2014   w/IOC   MEATOTOMY  06/2003   Distal urethral meatotomy with YAG laser/notes 05/16/2011   POLYPECTOMY     SUPRAPUBIC PROSTATECTOMY  02/15/2003   Archie Endo 05/16/2011     Medications: Current Meds  Medication Sig   acetaminophen (TYLENOL) 500 MG tablet Take 500 mg by mouth every 6 (six) hours as needed for mild pain, moderate pain or fever.   amLODipine (NORVASC) 10 MG tablet Take 10 mg by mouth daily.   ciclopirox (LOPROX) 0.77 % cream Apply 1 application topically daily.   dorzolamide-timolol (COSOPT) 22.3-6.8 MG/ML ophthalmic solution Place 1 drop into the left eye 2 (two) times daily.   ELIQUIS 5 MG TABS tablet TAKE 1 TABLET(5 MG) BY MOUTH TWICE DAILY   esomeprazole (NEXIUM) 20 MG capsule Take 20 mg by mouth daily.   ketotifen (ZADITOR) 0.025 % ophthalmic solution Place 1 drop into both eyes daily.   latanoprost (XALATAN) 0.005 % ophthalmic solution Place 1 drop into both eyes at bedtime.   MELATONIN PO Take 1 tablet by mouth at bedtime.  Polyethyl Glycol-Propyl Glycol (SYSTANE OP) Place 1 drop into both eyes daily as needed (dry eyes).   terazosin (HYTRIN) 10 MG capsule Take 10 mg by mouth at bedtime.    triamcinolone cream (KENALOG) 0.1 % Apply 1 application topically daily as needed (dry and itching skin).    [DISCONTINUED] amLODipine (NORVASC) 5 MG tablet Take 5 mg by mouth daily.     Allergies: Allergies  Allergen Reactions   Aspirin     GI Bleeding Other reaction(s): upset stomach   Bee Venom     Other reaction(s): Unknown   Tamsulosin     Other reaction(s): increased frequency   Nifedipine Other (See Comments)    Headaches  Other reaction(s): headaches    Social History: The patient   reports that he quit smoking about 23 years ago. His smoking use included cigarettes. He has a 8.00 pack-year smoking history. He has never used smokeless tobacco. He reports current alcohol use of about 14.0 standard drinks of alcohol per week. He reports that he does not use drugs.   Family History: The patient's family history includes Alzheimer's disease in his mother; Cancer - Lung in his sister; Heart attack in his father.   Review of Systems: Please see the history of present illness.   All other systems are reviewed and negative.   Physical Exam: VS:  BP (!) 140/76   Pulse (!) 58   Ht '5\' 11"'$  (1.803 m)   Wt 156 lb (70.8 kg)   SpO2 99%   BMI 21.76 kg/m  .  BMI Body mass index is 21.76 kg/m.  Wt Readings from Last 3 Encounters:  12/10/22 156 lb (70.8 kg)  06/08/22 151 lb (68.5 kg)  03/05/22 155 lb 3.3 oz (70.4 kg)   Affect appropriate Healthy:  appears stated age HEENT: normal Neck supple with no adenopathy JVP normal no bruits no thyromegaly Lungs clear with no wheezing and good diaphragmatic motion Heart:  S1/S2 no murmur, no rub, gallop or click PMI normal Abdomen: benighn, BS positve, no tenderness, no AAA no bruit.  No HSM or HJR Distal pulses intact with no bruits No edema Neuro non-focal Skin warm and dry No muscular weakness   LABORATORY DATA:  EKG:  03/06/22   afib rate 64 chronic lateral T wave changes   Lab Results  Component Value Date   WBC 9.2 03/05/2022   HGB 12.4 (L) 03/05/2022   HCT 38.4 (L) 03/05/2022   PLT 120 (L) 03/05/2022   GLUCOSE 125 (H) 03/05/2022   ALT 11 06/28/2020   AST 11 06/28/2020   NA 136 03/05/2022   K 3.7 03/05/2022   CL 104 03/05/2022   CREATININE 1.07 03/05/2022   BUN 34 (H) 03/05/2022   CO2 22 03/05/2022   TSH 2.270 01/11/2020   INR 1.14 11/11/2014    Other Studies Reviewed Today:  Echo 08/18/20    1. Left ventricular ejection fraction, by estimation, is 65 to 70%. The  left ventricle has normal function. The  left ventricle has no regional  wall motion abnormalities. There is moderate variable left ventricular  hypertrophy of the basal-septal  segment. Left ventricular diastolic parameters are consistent with  age-related delayed relaxation (normal).   2. Right ventricular systolic function is normal. The right ventricular  size is normal. Tricuspid regurgitation signal is inadequate for assessing  PA pressure.   3. Left atrial size was mildly dilated.   4. Right atrial size was mildly dilated.   5. The mitral valve is normal  in structure. Mild mitral valve  regurgitation. No evidence of mitral stenosis.   6. The aortic valve is tricuspid. Aortic valve regurgitation is not  visualized. Mild aortic valve sclerosis is present, with no evidence of  aortic valve stenosis.   7. Aortic dilatation noted. There is borderline dilatation of the  ascending aorta measuring 38 mm.   8. The inferior vena cava is normal in size with greater than 50%  respiratory variability, suggesting right atrial pressure of 3 mmHg.      Assessment/Plan:  1. PAF -  [place] done 06/17/20 Asymptomatic reversion to afib Rate control and anticoagulation strategy adopted  On Eliquis with no bleeding issues   Weight > 60 kg and Cr normal 03/05/22 Echo 08/18/20 with moderate LVH EF 65-70% mild biatrial enlargement and only mild MR   2. HTN - Well controlled.  Continue current medications and low sodium Dash type diet.   Improved on higher dose of norvasc   3. Chronically abnormal EKG with lateral T wave changes - active with no angina observe  Given age No indication for stress testing   5. GI:  F/u pathology with Dr Fuller Plan regarding polyps no bleeding issues  Normal bowel habits    Current medicines are reviewed with the patient today.  The patient does not have concerns regarding medicines other than what has been noted above.  The following changes have been made:  None   Labs/ tests ordered today include: None   Disposition:    FU in 6 months   Signed: Jenkins Rouge, MD  12/10/2022 10:14 AM  [facility] [address] [place], [place]  [address] Phone: [phone] Fax: [phone]

## 2022-11-28 DIAGNOSIS — H401122 Primary open-angle glaucoma, left eye, moderate stage: Secondary | ICD-10-CM | POA: Diagnosis not present

## 2022-11-28 DIAGNOSIS — H43812 Vitreous degeneration, left eye: Secondary | ICD-10-CM | POA: Diagnosis not present

## 2022-11-28 DIAGNOSIS — H1045 Other chronic allergic conjunctivitis: Secondary | ICD-10-CM | POA: Diagnosis not present

## 2022-11-28 DIAGNOSIS — H4720 Unspecified optic atrophy: Secondary | ICD-10-CM | POA: Diagnosis not present

## 2022-11-28 DIAGNOSIS — H57813 Brow ptosis, bilateral: Secondary | ICD-10-CM | POA: Diagnosis not present

## 2022-11-28 DIAGNOSIS — H0102B Squamous blepharitis left eye, upper and lower eyelids: Secondary | ICD-10-CM | POA: Diagnosis not present

## 2022-11-28 DIAGNOSIS — H0102A Squamous blepharitis right eye, upper and lower eyelids: Secondary | ICD-10-CM | POA: Diagnosis not present

## 2022-11-28 DIAGNOSIS — H04123 Dry eye syndrome of bilateral lacrimal glands: Secondary | ICD-10-CM | POA: Diagnosis not present

## 2022-12-10 ENCOUNTER — Encounter: Payer: Self-pay | Admitting: Cardiovascular Disease

## 2022-12-10 ENCOUNTER — Ambulatory Visit: Payer: Medicare Other | Attending: Cardiovascular Disease | Admitting: Cardiovascular Disease

## 2022-12-10 VITALS — BP 140/76 | HR 58 | Ht 71.0 in | Wt 156.0 lb

## 2022-12-10 DIAGNOSIS — Z5181 Encounter for therapeutic drug level monitoring: Secondary | ICD-10-CM | POA: Diagnosis not present

## 2022-12-10 DIAGNOSIS — Z7901 Long term (current) use of anticoagulants: Secondary | ICD-10-CM | POA: Insufficient documentation

## 2022-12-10 DIAGNOSIS — I10 Essential (primary) hypertension: Secondary | ICD-10-CM | POA: Diagnosis not present

## 2022-12-10 DIAGNOSIS — I480 Paroxysmal atrial fibrillation: Secondary | ICD-10-CM | POA: Insufficient documentation

## 2022-12-10 NOTE — Patient Instructions (Signed)
Medication Instructions:  Your physician recommends that you continue on your current medications as directed. Please refer to the Current Medication list given to you today.  *If you need a refill on your cardiac medications before your next appointment, please call your pharmacy*  Lab Work: If you have labs (blood work) drawn today and your tests are completely normal, you will receive your results only by: [place] (if you have MyChart) OR A paper copy in the mail If you have any lab test that is abnormal or we need to change your treatment, we will call you to review the results.  Testing/Procedures: None ordered today.  Follow-Up: At [facility], you and your health needs are our priority.  As part of our continuing mission to provide you with exceptional heart care, we have created designated Provider Care Teams.  These Care Teams include your primary Cardiologist (physician) and Advanced Practice Providers (APPs -  Physician Assistants and Nurse Practitioners) who all work together to provide you with the care you need, when you need it.  We recommend signing up for the patient portal called "MyChart".  Sign up information is provided on this After Visit Summary.  MyChart is used to connect with patients for Virtual Visits (Telemedicine).  Patients are able to view lab/test results, encounter notes, upcoming appointments, etc.  Non-urgent messages can be sent to your provider as well.   To learn more about what you can do with MyChart, go to [email or web address].    Your next appointment:   6 month(s)  The format for your next appointment:   In Person  Provider:   Jenkins Rouge, MD     Important Information About Sugar

## 2022-12-17 DIAGNOSIS — B354 Tinea corporis: Secondary | ICD-10-CM | POA: Diagnosis not present

## 2022-12-17 DIAGNOSIS — I10 Essential (primary) hypertension: Secondary | ICD-10-CM | POA: Diagnosis not present

## 2023-01-01 DIAGNOSIS — J3489 Other specified disorders of nose and nasal sinuses: Secondary | ICD-10-CM | POA: Diagnosis not present

## 2023-01-01 DIAGNOSIS — R5383 Other fatigue: Secondary | ICD-10-CM | POA: Diagnosis not present

## 2023-01-01 DIAGNOSIS — R051 Acute cough: Secondary | ICD-10-CM | POA: Diagnosis not present

## 2023-01-01 DIAGNOSIS — J449 Chronic obstructive pulmonary disease, unspecified: Secondary | ICD-10-CM | POA: Diagnosis not present

## 2023-01-04 DIAGNOSIS — R059 Cough, unspecified: Secondary | ICD-10-CM | POA: Diagnosis not present

## 2023-01-04 DIAGNOSIS — R0989 Other specified symptoms and signs involving the circulatory and respiratory systems: Secondary | ICD-10-CM | POA: Diagnosis not present

## 2023-01-04 DIAGNOSIS — I10 Essential (primary) hypertension: Secondary | ICD-10-CM | POA: Diagnosis not present

## 2023-01-08 DIAGNOSIS — J069 Acute upper respiratory infection, unspecified: Secondary | ICD-10-CM | POA: Diagnosis not present

## 2023-03-02 ENCOUNTER — Other Ambulatory Visit: Payer: Self-pay | Admitting: Cardiovascular Disease

## 2023-03-02 DIAGNOSIS — R9431 Abnormal electrocardiogram [ECG] [EKG]: Secondary | ICD-10-CM

## 2023-03-02 DIAGNOSIS — I480 Paroxysmal atrial fibrillation: Secondary | ICD-10-CM

## 2023-03-02 DIAGNOSIS — I10 Essential (primary) hypertension: Secondary | ICD-10-CM

## 2023-03-04 NOTE — Telephone Encounter (Signed)
Eliquis '5mg'$  refill request received. Patient is 89 years old, weight-70.8kg, Crea-1.20 on 01/04/2023 via scanned labs from PCP, Diagnosis-Afib, and last seen by Dr. Johnsie Cancel on 12/10/22. Dose is appropriate based on dosing criteria. Will send in refill to requested pharmacy.

## 2023-03-04 NOTE — Telephone Encounter (Addendum)
Called pt and spoke to his partner per his request, Suanne Marker, who stated pt has had blood work with in the past year.  Called pt's PCP and LMOM to fax over blood work if has had any in the past year specifically cbc, bmet/cmet

## 2023-03-04 NOTE — Telephone Encounter (Signed)
Prescription refill request for Eliquis received. Indication: afib  Last office visit: Nishan 12/10/2022 Scr: 1.07, 03/05/2022 Age: 89 yo  Weight: 70 kg

## 2023-03-20 DIAGNOSIS — D6869 Other thrombophilia: Secondary | ICD-10-CM | POA: Diagnosis not present

## 2023-03-20 DIAGNOSIS — Z23 Encounter for immunization: Secondary | ICD-10-CM | POA: Diagnosis not present

## 2023-03-20 DIAGNOSIS — I480 Paroxysmal atrial fibrillation: Secondary | ICD-10-CM | POA: Diagnosis not present

## 2023-03-20 DIAGNOSIS — E778 Other disorders of glycoprotein metabolism: Secondary | ICD-10-CM | POA: Diagnosis not present

## 2023-03-20 DIAGNOSIS — I10 Essential (primary) hypertension: Secondary | ICD-10-CM | POA: Diagnosis not present

## 2023-03-20 DIAGNOSIS — K644 Residual hemorrhoidal skin tags: Secondary | ICD-10-CM | POA: Diagnosis not present

## 2023-03-20 DIAGNOSIS — I6523 Occlusion and stenosis of bilateral carotid arteries: Secondary | ICD-10-CM | POA: Diagnosis not present

## 2023-03-20 DIAGNOSIS — D696 Thrombocytopenia, unspecified: Secondary | ICD-10-CM | POA: Diagnosis not present

## 2023-03-20 DIAGNOSIS — N401 Enlarged prostate with lower urinary tract symptoms: Secondary | ICD-10-CM | POA: Diagnosis not present

## 2023-03-20 DIAGNOSIS — K219 Gastro-esophageal reflux disease without esophagitis: Secondary | ICD-10-CM | POA: Diagnosis not present

## 2023-03-20 DIAGNOSIS — Z0000 Encounter for general adult medical examination without abnormal findings: Secondary | ICD-10-CM | POA: Diagnosis not present

## 2023-03-20 DIAGNOSIS — J449 Chronic obstructive pulmonary disease, unspecified: Secondary | ICD-10-CM | POA: Diagnosis not present

## 2023-05-22 DIAGNOSIS — R5383 Other fatigue: Secondary | ICD-10-CM | POA: Diagnosis not present

## 2023-05-22 DIAGNOSIS — D696 Thrombocytopenia, unspecified: Secondary | ICD-10-CM | POA: Diagnosis not present

## 2023-05-22 DIAGNOSIS — E778 Other disorders of glycoprotein metabolism: Secondary | ICD-10-CM | POA: Diagnosis not present

## 2023-05-22 DIAGNOSIS — K644 Residual hemorrhoidal skin tags: Secondary | ICD-10-CM | POA: Diagnosis not present

## 2023-05-29 DIAGNOSIS — H401122 Primary open-angle glaucoma, left eye, moderate stage: Secondary | ICD-10-CM | POA: Diagnosis not present

## 2023-06-04 NOTE — Progress Notes (Signed)
Date:  06/14/2023   Jared Passy, PhD Date of Birth: 03/15/1929 Medical Record #[id number]  PCP:  Merri Brunette, MD  Cardiologist:  Townsend Roger  History of Present Illness:  89 y.o. with history of HTN, COPD, GERD. Chronically abnormal ECG Did not tolerated Procardia for his BP.  Seen by NP 01/11/20 found to be in afib Started on eliquis and had [facility] 06/17/20. Tends to be bradycardic in sinus And is not Rx with AV nodal drugs currently At some point reverted back to asymptomatic afib and rate control adopted   Held his DOAC for colonoscopy and EGD with Dr Russella Dar 10/10/20 and had multiple polyps removed    Walks everyday at [employer] Has a little Yorkie named Lilly that's [employer]   03/05/22 Hospitalized with COPD/Pneumonia Rx with Omnicef and Azithromycin   Seems to regret being as old as he is but still active Hct 43.4  PLT 135  normal Cr 1.2 11/26/22   BP better on higher dose Norvasc   He is retired Warden/ranger Was born in [place] , grew up in [place] and went to school in [place] His daughter just moved back to [place] from there    Past Medical History:  Diagnosis Date   Acute, but ill-defined, cerebrovascular disease 04/07/2014   Adenomatous colon polyp    Allergy    Atrial fibrillation (HCC)    BPH (benign prostatic hyperplasia)    Cataract    removed both eyes    COPD (chronic obstructive pulmonary disease) (HCC)    Eczema    Erectile dysfunction    Gallstones    GERD (gastroesophageal reflux disease)    Heart murmur    HOH (hard of hearing)    HTN (hypertension)    Meningioma (HCC)    Right frontal   Meralgia paraesthetica    Palpitations    Urinary retention     Past Surgical History:  Procedure Laterality Date   BLADDER DIVERTICULECTOMY  02/15/2003   Hattie Perch 05/16/2011   CARDIAC CATHETERIZATION     CARDIOVERSION N/A 06/17/2020   Procedure: CARDIOVERSION;  Surgeon: Wendall Stade, MD;  [facility] ENDOSCOPY;  Service: Cardiovascular;   Laterality: N/A;   CATARACT EXTRACTION Bilateral 2013   CHOLECYSTECTOMY N/A 11/19/2014   Procedure: LAPAROSCOPIC CHOLECYSTECTOMY WITH INTRAOPERATIVE CHOLANGIOGRAM;  Surgeon: Avel Peace, MD;  Location: [facility] OR;  Service: General;  Laterality: N/A;   COLONOSCOPY     COLONOSCOPY W/ POLYPECTOMY     GUM SURGERY  1999   INGUINAL HERNIA REPAIR Left    INGUINAL HERNIA REPAIR Right 11/2009   LAPAROSCOPIC CHOLECYSTECTOMY  11/19/2014   w/IOC   MEATOTOMY  06/2003   Distal urethral meatotomy with YAG laser/notes 05/16/2011   POLYPECTOMY     SUPRAPUBIC PROSTATECTOMY  02/15/2003   Hattie Perch 05/16/2011     Medications: Current Meds  Medication Sig   acetaminophen (TYLENOL) 500 MG tablet Take 500 mg by mouth every 6 (six) hours as needed for mild pain, moderate pain or fever.   amLODipine (NORVASC) 10 MG tablet Take 10 mg by mouth daily.   apixaban (ELIQUIS) 5 MG TABS tablet TAKE 1 TABLET(5 MG) BY MOUTH TWICE DAILY   ciclopirox (LOPROX) 0.77 % cream Apply 1 application topically daily.   dorzolamide-timolol (COSOPT) 22.3-6.8 MG/ML ophthalmic solution Place 1 drop into the left eye 2 (two) times daily.   esomeprazole (NEXIUM) 20 MG capsule Take 20 mg by mouth daily.   ketotifen (ZADITOR) 0.025 % ophthalmic solution Place 1 drop into  both eyes daily.   latanoprost (XALATAN) 0.005 % ophthalmic solution Place 1 drop into both eyes at bedtime.   MELATONIN PO Take 1 tablet by mouth at bedtime.   Polyethyl Glycol-Propyl Glycol (SYSTANE OP) Place 1 drop into both eyes daily as needed (dry eyes).   terazosin (HYTRIN) 10 MG capsule Take 10 mg by mouth at bedtime.    triamcinolone cream (KENALOG) 0.1 % Apply 1 application topically daily as needed (dry and itching skin).      Allergies: Allergies  Allergen Reactions   Aspirin     GI Bleeding Other reaction(s): upset stomach   Bee Venom     Other reaction(s): Unknown   Tamsulosin     Other reaction(s): increased frequency   Nifedipine Other (See  Comments)    Headaches  Other reaction(s): headaches    Social History: The patient  reports that he quit smoking about 23 years ago. His smoking use included cigarettes. He has a 8.00 pack-year smoking history. He has never used smokeless tobacco. He reports current alcohol use of about 14.0 standard drinks of alcohol per week. He reports that he does not use drugs.   Family History: The patient's family history includes Alzheimer's disease in his mother; Cancer - Lung in his sister; Heart attack in his father.   Review of Systems: Please see the history of present illness.   All other systems are reviewed and negative.   Physical Exam: VS:  BP 116/62   Pulse (!) 54   Ht 5\' 11"  (1.803 m)   Wt 149 lb (67.6 kg)   BMI 20.78 kg/m  .  BMI Body mass index is 20.78 kg/m.  Wt Readings from Last 3 Encounters:  06/14/23 149 lb (67.6 kg)  12/10/22 156 lb (70.8 kg)  06/08/22 151 lb (68.5 kg)   Affect appropriate Healthy:  appears stated age HEENT: normal Neck supple with no adenopathy JVP normal no bruits no thyromegaly Lungs clear with no wheezing and good diaphragmatic motion Heart:  S1/S2 no murmur, no rub, gallop or click PMI normal Abdomen: benighn, BS positve, no tenderness, no AAA no bruit.  No HSM or HJR Distal pulses intact with no bruits No edema Neuro non-focal Skin warm and dry No muscular weakness   LABORATORY DATA:  EKG:  06/14/2023   afib rate 54 chronic lateral T wave changes PVC   Lab Results  Component Value Date   WBC 9.2 03/05/2022   HGB 12.4 (L) 03/05/2022   HCT 38.4 (L) 03/05/2022   PLT 120 (L) 03/05/2022   GLUCOSE 125 (H) 03/05/2022   ALT 11 06/28/2020   AST 11 06/28/2020   NA 136 03/05/2022   K 3.7 03/05/2022   CL 104 03/05/2022   CREATININE 1.07 03/05/2022   BUN 34 (H) 03/05/2022   CO2 22 03/05/2022   TSH 2.270 01/11/2020   INR 1.14 11/11/2014    Other Studies Reviewed Today:  Echo 08/18/20    1. Left ventricular ejection fraction,  by estimation, is 65 to 70%. The  left ventricle has normal function. The left ventricle has no regional  wall motion abnormalities. There is moderate variable left ventricular  hypertrophy of the basal-septal  segment. Left ventricular diastolic parameters are consistent with  age-related delayed relaxation (normal).   2. Right ventricular systolic function is normal. The right ventricular  size is normal. Tricuspid regurgitation signal is inadequate for assessing  PA pressure.   3. Left atrial size was mildly dilated.   4. Right atrial  size was mildly dilated.   5. The mitral valve is normal in structure. Mild mitral valve  regurgitation. No evidence of mitral stenosis.   6. The aortic valve is tricuspid. Aortic valve regurgitation is not  visualized. Mild aortic valve sclerosis is present, with no evidence of  aortic valve stenosis.   7. Aortic dilatation noted. There is borderline dilatation of the  ascending aorta measuring 38 mm.   8. The inferior vena cava is normal in size with greater than 50%  respiratory variability, suggesting right atrial pressure of 3 mmHg.      Assessment/Plan:  1. PAF -  [facility] done 06/17/20 Asymptomatic reversion to afib Rate control and anticoagulation strategy adopted  On Eliquis with no bleeding issues   Weight > 60 kg and Cr normal 03/05/22 Echo 08/18/20 with moderate LVH EF 65-70% mild biatrial enlargement and only mild MR   2. HTN - Well controlled.  Continue current medications and low sodium Dash type diet.   Improved on higher dose of norvasc   3. Chronically abnormal EKG with lateral T wave changes - active with no angina observe  Given age No indication for stress testing   5. GI:  F/u pathology with Dr Russella Dar regarding polyps no bleeding issues  Normal bowel habits    Current medicines are reviewed with the patient today.  The patient does not have concerns regarding medicines other than what has been noted above.  The following changes have  been made:  None   Labs/ tests ordered today include: None   Disposition:   FU in 6 months   Signed: Charlton Haws, MD  06/14/2023 9:33 AM  [facility] [address] [place], [place]  [address] Phone: [phone] Fax: [phone]

## 2023-06-14 ENCOUNTER — Ambulatory Visit: Payer: Medicare Other | Attending: Cardiovascular Disease | Admitting: Cardiovascular Disease

## 2023-06-14 ENCOUNTER — Encounter: Payer: Self-pay | Admitting: Cardiovascular Disease

## 2023-06-14 VITALS — BP 116/62 | HR 54 | Ht 71.0 in | Wt 149.0 lb

## 2023-06-14 DIAGNOSIS — Z5181 Encounter for therapeutic drug level monitoring: Secondary | ICD-10-CM | POA: Insufficient documentation

## 2023-06-14 DIAGNOSIS — I480 Paroxysmal atrial fibrillation: Secondary | ICD-10-CM | POA: Insufficient documentation

## 2023-06-14 DIAGNOSIS — I10 Essential (primary) hypertension: Secondary | ICD-10-CM

## 2023-06-14 DIAGNOSIS — Z7901 Long term (current) use of anticoagulants: Secondary | ICD-10-CM | POA: Diagnosis not present

## 2023-06-14 NOTE — Patient Instructions (Signed)
Medication Instructions:  Your physician recommends that you continue on your current medications as directed. Please refer to the Current Medication list given to you today.  *If you need a refill on your cardiac medications before your next appointment, please call your pharmacy*  Lab Work: If you have labs (blood work) drawn today and your tests are completely normal, you will receive your results only by: MyChart Message (if you have MyChart) OR A paper copy in the mail If you have any lab test that is abnormal or we need to change your treatment, we will call you to review the results.  Testing/Procedures: None ordered today.  Follow-Up: At [facility], you and your health needs are our priority.  As part of our continuing mission to provide you with exceptional heart care, we have created designated Provider Care Teams.  These Care Teams include your primary Cardiologist (physician) and Advanced Practice Providers (APPs -  Physician Assistants and Nurse Practitioners) who all work together to provide you with the care you need, when you need it.  We recommend signing up for the patient portal called "MyChart".  Sign up information is provided on this After Visit Summary.  MyChart is used to connect with patients for Virtual Visits (Telemedicine).  Patients are able to view lab/test results, encounter notes, upcoming appointments, etc.  Non-urgent messages can be sent to your provider as well.   To learn more about what you can do with MyChart, go to [email or web address].    Your next appointment:   6 month(s)  Provider:   Peter Nishan, MD     

## 2023-07-10 DIAGNOSIS — H6121 Impacted cerumen, right ear: Secondary | ICD-10-CM | POA: Diagnosis not present

## 2023-07-10 DIAGNOSIS — H903 Sensorineural hearing loss, bilateral: Secondary | ICD-10-CM | POA: Diagnosis not present

## 2023-07-10 DIAGNOSIS — H838X3 Other specified diseases of inner ear, bilateral: Secondary | ICD-10-CM | POA: Diagnosis not present

## 2023-08-07 DIAGNOSIS — D1801 Hemangioma of skin and subcutaneous tissue: Secondary | ICD-10-CM | POA: Diagnosis not present

## 2023-08-07 DIAGNOSIS — L821 Other seborrheic keratosis: Secondary | ICD-10-CM | POA: Diagnosis not present

## 2023-09-11 DIAGNOSIS — L820 Inflamed seborrheic keratosis: Secondary | ICD-10-CM | POA: Diagnosis not present

## 2023-09-11 DIAGNOSIS — D485 Neoplasm of uncertain behavior of skin: Secondary | ICD-10-CM | POA: Diagnosis not present

## 2023-09-22 DIAGNOSIS — Z23 Encounter for immunization: Secondary | ICD-10-CM | POA: Diagnosis not present

## 2023-10-01 IMAGING — DX DG CHEST 2V
2 series · 2 of 2 positions shown · non-contrast
Comparison: 11/04/2018

CLINICAL DATA: Fever and cough.

EXAM:
CHEST - 2 VIEW

[chest pa]
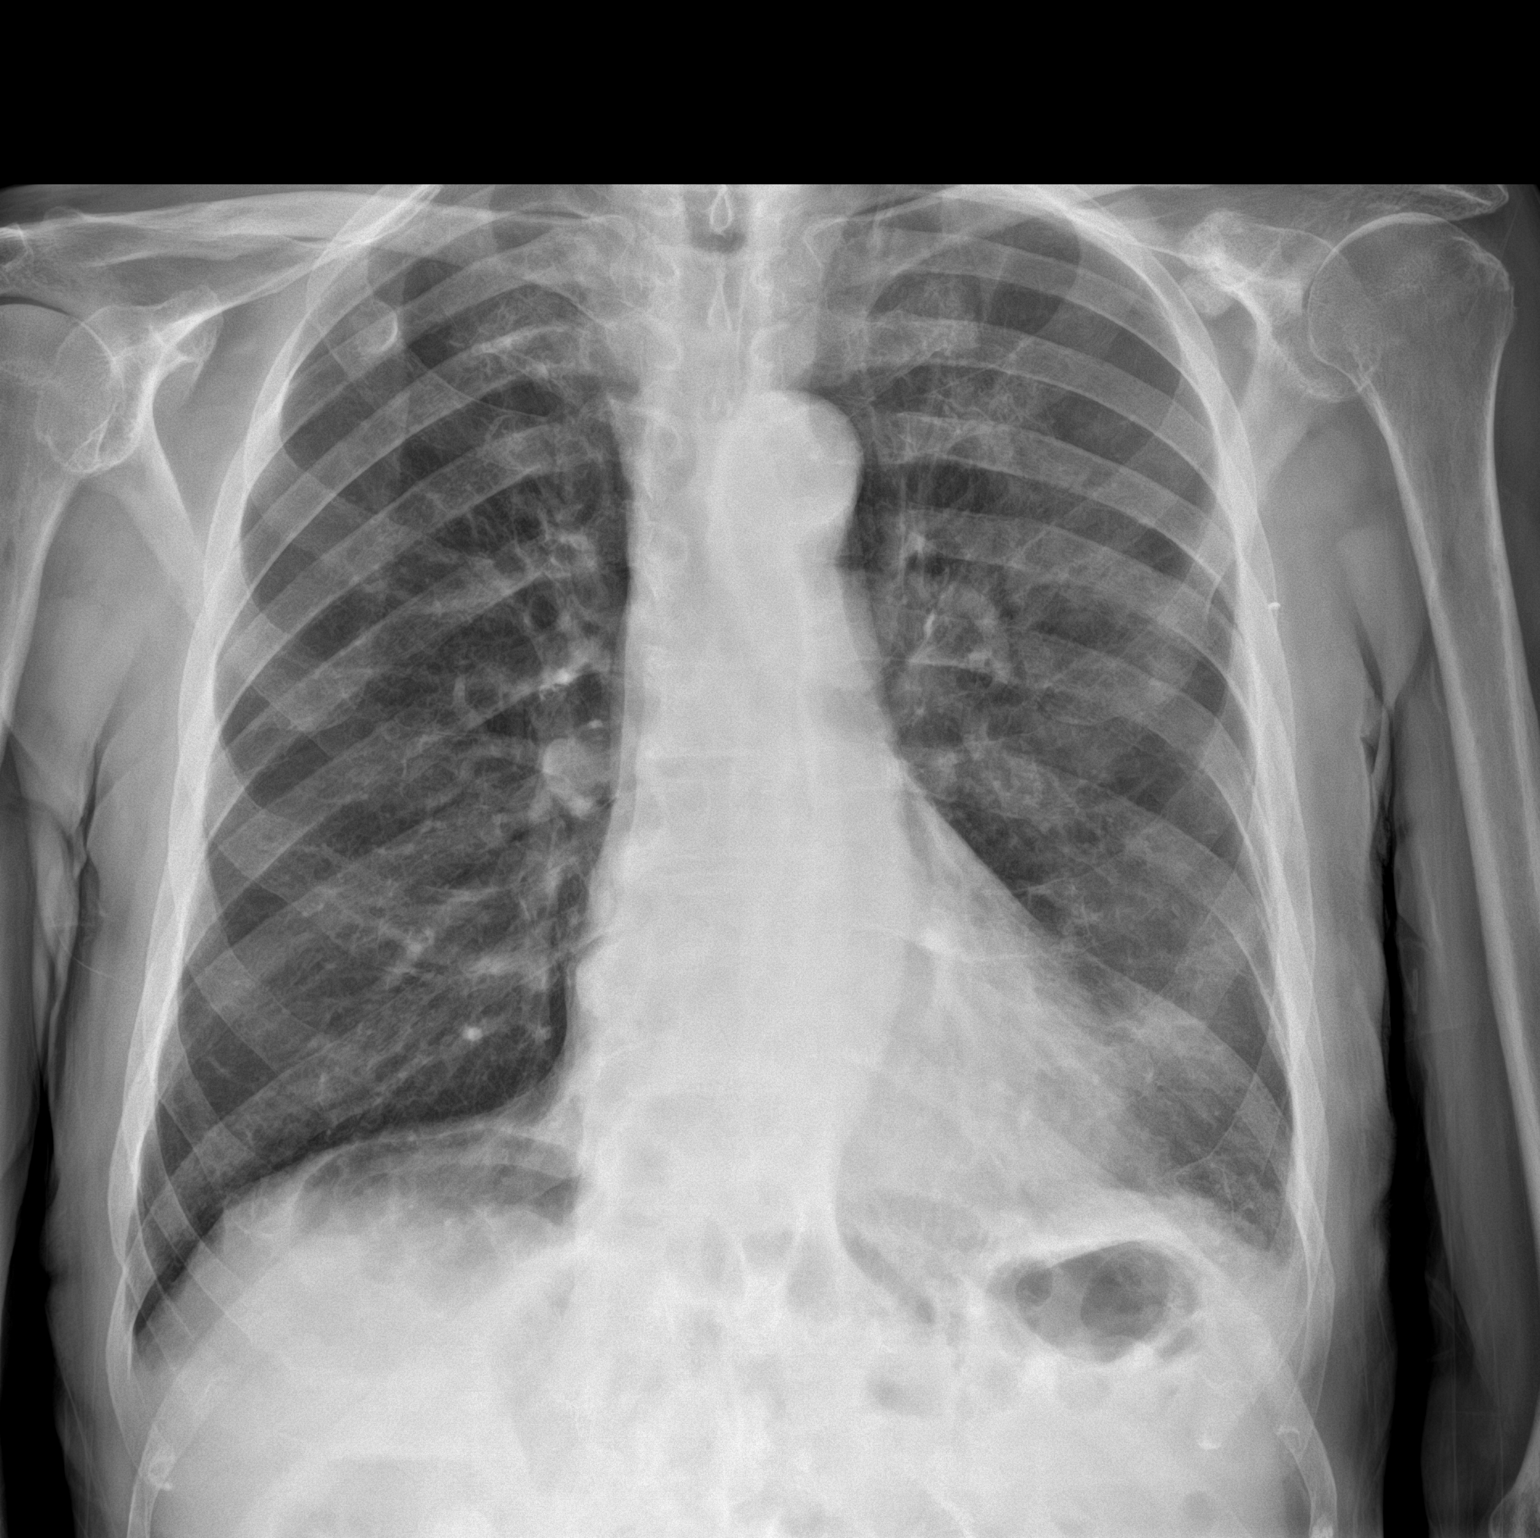

[chest lat]
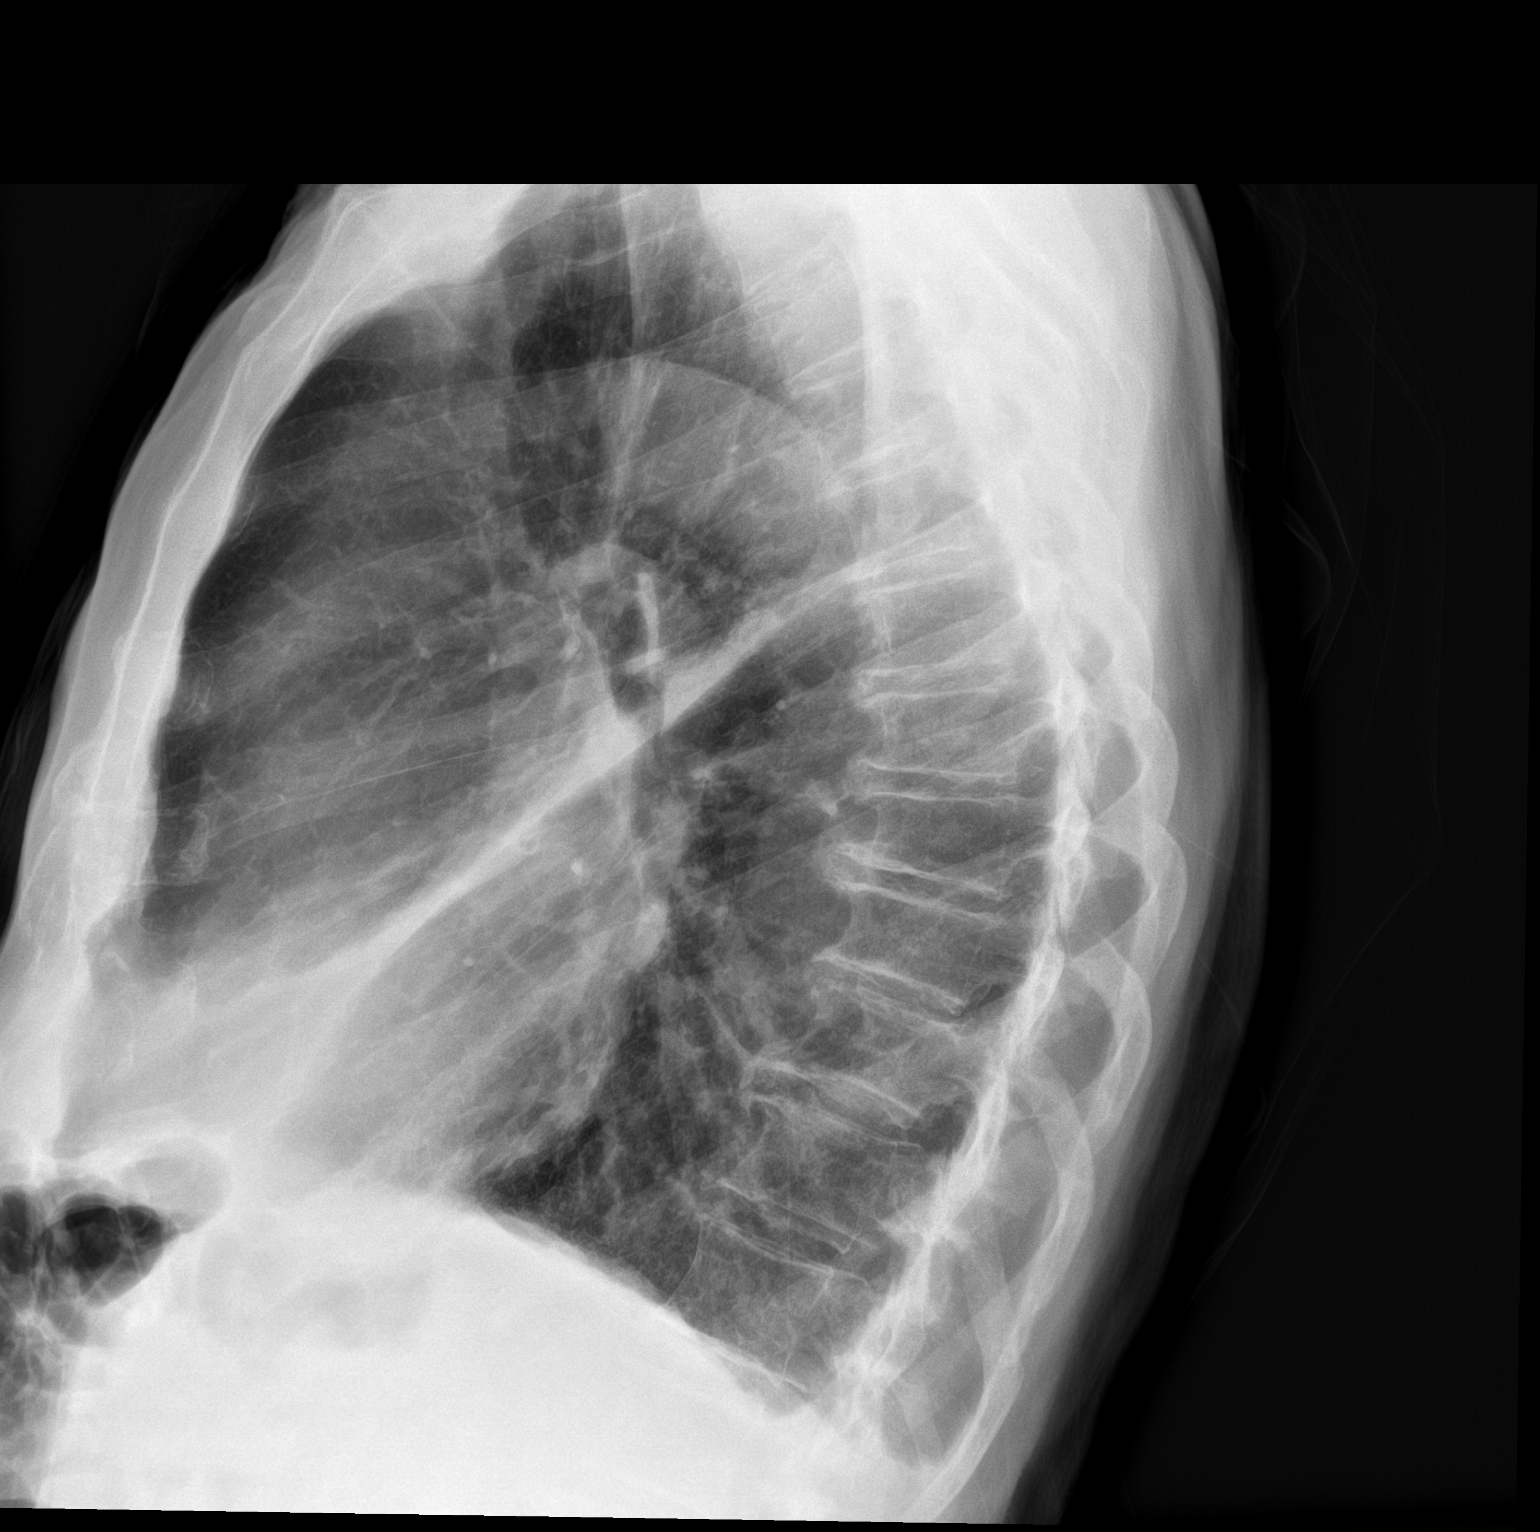

[2 of 2 positions shown; findings below may reference images not displayed]

FINDINGS: Subtle hazy patchy density over the left mid to lower lung which
could be due to early infection. Increased density along the major
fissure on the lateral film suggesting mild atelectasis. No
significant effusion. Cardiomediastinal silhouette and remainder of
the exam is unchanged.
IMPRESSION: Subtle hazy patchy density over the left mid to lower lung which
could be due to early infection.

## 2023-10-04 ENCOUNTER — Other Ambulatory Visit: Payer: Self-pay | Admitting: Cardiovascular Disease

## 2023-10-04 DIAGNOSIS — I480 Paroxysmal atrial fibrillation: Secondary | ICD-10-CM

## 2023-10-04 NOTE — Telephone Encounter (Signed)
Prescription refill request for Eliquis received. Indication:afib Last office visit:6/24 Scr:1.20  1/24 Age: 89 Weight:67.6  kg  Prescription refilled

## 2023-10-20 DIAGNOSIS — Z23 Encounter for immunization: Secondary | ICD-10-CM | POA: Diagnosis not present

## 2023-11-07 ENCOUNTER — Other Ambulatory Visit: Payer: Self-pay

## 2023-11-07 MED ORDER — LAGEVRIO 200 MG PO CAPS
4.0000 | ORAL_CAPSULE | Freq: Two times a day (BID) | ORAL | 0 refills | Status: DC
  Filled 2023-11-07: qty 40, 5d supply, fill #0

## 2023-11-07 MED ORDER — MELATONIN 5 MG PO TABS: 5.0000 mg | ORAL_TABLET | Freq: Every evening | ORAL | 0 refills | Status: DC

## 2023-11-08 ENCOUNTER — Other Ambulatory Visit: Payer: Self-pay

## 2023-11-15 ENCOUNTER — Ambulatory Visit
Admission: RE | Admit: 2023-11-15 | Discharge: 2023-11-15 | Disposition: A | Discharge status: Home / Self Care | Payer: Medicare Other | Source: Ambulatory Visit | Attending: Nurse Practitioner | Admitting: Nurse Practitioner

## 2023-11-15 ENCOUNTER — Ambulatory Visit (INDEPENDENT_AMBULATORY_CARE_PROVIDER_SITE_OTHER): Payer: Medicare Other

## 2023-11-15 VITALS — BP 154/91 | HR 70 | Temp 99.1°F | Resp 15

## 2023-11-15 DIAGNOSIS — R051 Acute cough: Secondary | ICD-10-CM

## 2023-11-15 DIAGNOSIS — J984 Other disorders of lung: Secondary | ICD-10-CM | POA: Diagnosis not present

## 2023-11-15 MED ORDER — BENZONATATE 100 MG PO CAPS: 100.0000 mg | ORAL_CAPSULE | Freq: Three times a day (TID) | ORAL | 0 refills | Status: AC | PRN

## 2023-11-15 NOTE — Discharge Instructions (Addendum)
Your show chest x-ray results are pending.  You have been prescribed benzonatate 100 mg 3 times daily as needed for 10 days.  You are encouraged to continue to treat all any other symptoms.  You have follow-up with your primary care provider if the symptoms persist worse.

## 2023-11-15 NOTE — ED Triage Notes (Signed)
Pt presents with persistent cough.   Pt had test positive on 11/6. Took the antiviral medicine and has had worsening cough.

## 2023-11-15 NOTE — ED Provider Notes (Signed)
[facility]    CSN: [id number] Arrival date & time: 11/15/23  1527      History   Chief Complaint Chief Complaint  Patient presents with   Cough    89 yo diagnosed with Covid 12/07/23. Mild symptoms initially, but coughing has worsened. 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   HPI  He is in today with his significant other Jared Neal.  He reports being diagnosed with COVID on 10/06/2023 and did complete antivirals.  He is now having a persistent cough.  They report that the cough has been dry.  He did cough up some clear phlegm this afternoon.  She reports this is his first time getting COVID.  But he was diagnosed with pneumonia sometime ago.  They are just wanting to make sure that he is doing okay.  He does have a history of COPD.  He denies any recent fevers however this does have a low-grade temp of 99.1 here in the office.  Denies any dizziness, headaches, chest pain, shortness of breath, nausea or vomiting. Past Medical History:  Diagnosis Date   Acute, but ill-defined, cerebrovascular disease 04/07/2014   Adenomatous colon polyp    Allergy    Atrial fibrillation (HCC)    BPH (benign prostatic hyperplasia)    Cataract    removed both eyes    COPD (chronic obstructive pulmonary disease) (HCC)    Eczema    Erectile dysfunction    Gallstones    GERD (gastroesophageal reflux disease)    Heart murmur    HOH (hard of hearing)    HTN (hypertension)    Meningioma (HCC)    Right frontal   Meralgia paraesthetica    Palpitations    Urinary retention     Patient Active Problem List   Diagnosis Date Noted   Paroxysmal A-fib (HCC) 03/06/2022   Dehydration 03/06/2022   Community acquired pneumonia 03/05/2022   Symptomatic cholelithiasis 11/19/2014   Acute, but ill-defined, cerebrovascular disease 04/07/2014   Murmur 12/16/2013   Abnormal ECG 12/15/2012   Palpitations    HTN (hypertension)    GERD (gastroesophageal reflux  disease)    Urinary retention    Eczema    COPD (chronic obstructive pulmonary disease) (HCC)    Erectile dysfunction    Meralgia paraesthetica     Past Surgical History:  Procedure Laterality Date   BLADDER DIVERTICULECTOMY  02/15/2003   Hattie Perch 05/16/2011   CARDIAC CATHETERIZATION     CARDIOVERSION N/A 06/17/2020   Procedure: CARDIOVERSION;  Surgeon: Wendall Stade, MD;  Location: [facility] ENDOSCOPY;  Service: Cardiovascular;  Laterality: N/A;   CATARACT EXTRACTION Bilateral 2013   CHOLECYSTECTOMY N/A 11/19/2014   Procedure: LAPAROSCOPIC CHOLECYSTECTOMY WITH INTRAOPERATIVE CHOLANGIOGRAM;  Surgeon: Avel Peace, MD;  Location: [facility] OR;  Service: General;  Laterality: N/A;   COLONOSCOPY     COLONOSCOPY W/ POLYPECTOMY     GUM SURGERY  1999   INGUINAL HERNIA REPAIR Left    INGUINAL HERNIA REPAIR Right 11/2009   LAPAROSCOPIC CHOLECYSTECTOMY  11/19/2014   w/IOC   MEATOTOMY  06/2003   Distal urethral meatotomy with YAG laser/notes 05/16/2011   POLYPECTOMY     SUPRAPUBIC PROSTATECTOMY  02/15/2003   Hattie Perch 05/16/2011       Home Medications    Prior to Admission medications   Medication Sig Start Date End Date Taking? Authorizing Provider  benzonatate (TESSALON) 100 MG capsule Take 1 capsule (100 mg total) by mouth 3 (  three) times daily as needed for up to 10 days for cough. Never suck or chew on a benzonatate capsule. 11/15/23 11/25/23 Yes Barbette Merino, NP  acetaminophen (TYLENOL) 500 MG tablet Take 500 mg by mouth every 6 (six) hours as needed for mild pain, moderate pain or fever.    [provider]  amLODipine (NORVASC) 10 MG tablet Take 10 mg by mouth daily.    [provider]  ciclopirox (LOPROX) 0.77 % cream Apply 1 application topically daily. 01/20/20   [provider]  dorzolamide-timolol (COSOPT) 22.3-6.8 MG/ML ophthalmic solution Place 1 drop into the left eye 2 (two) times daily. 11/21/21   [provider]  ELIQUIS 5 MG TABS tablet TAKE 1  TABLET(5 MG) BY MOUTH TWICE DAILY 10/04/23   Wendall Stade, MD  esomeprazole (NEXIUM) 20 MG capsule Take 20 mg by mouth daily. 12/21/21   [provider]  ketotifen (ZADITOR) 0.025 % ophthalmic solution Place 1 drop into both eyes daily.    [provider]  latanoprost (XALATAN) 0.005 % ophthalmic solution Place 1 drop into both eyes at bedtime. 12/14/19   [provider]  melatonin (MELATONIN MAXIMUM STRENGTH) 5 MG TABS Take 1 tablet (5 mg total) by mouth every evening. 11/07/23     MELATONIN PO Take 1 tablet by mouth at bedtime.    [provider]  molnupiravir EUA (LAGEVRIO) 200 MG CAPS capsule Take 4 capsules (800 mg total) by mouth 2 (two) times daily. 11/07/23   Merri Brunette, MD  Polyethyl Glycol-Propyl Glycol (SYSTANE OP) Place 1 drop into both eyes daily as needed (dry eyes).    [provider]  terazosin (HYTRIN) 10 MG capsule Take 10 mg by mouth at bedtime.  11/21/12   [provider]  triamcinolone cream (KENALOG) 0.1 % Apply 1 application topically daily as needed (dry and itching skin).  02/09/20   [provider]    Family History Family History  Problem Relation Age of Onset   Alzheimer's disease Mother    Heart attack Father    Cancer - Lung Sister    Colon cancer Neg Hx    Colon polyps Neg Hx    Esophageal cancer Neg Hx    Rectal cancer Neg Hx    Stomach cancer Neg Hx     Social History Social History   Tobacco Use   Smoking status: Former    Current packs/day: 0.00    Average packs/day: 0.2 packs/day for 40.0 years (8.0 ttl pk-yrs)    Types: Cigarettes    Start date: 11/12/1959    Quit date: 11/12/1999    Years since quitting: 24.0   Smokeless tobacco: Never  Vaping Use   Vaping status: Never Used  Substance Use Topics   Alcohol use: Yes    Alcohol/week: 14.0 standard drinks of alcohol    Types: 14 Glasses of wine per week    Comment: occ   Drug use: No     Allergies   Aspirin, Bee venom,  Tamsulosin, and Nifedipine   Review of Systems Review of Systems   Physical Exam Triage Vital Signs ED Triage Vitals  Encounter Vitals Group     BP 11/15/23 1556 (!) 154/91     Systolic BP Percentile --      Diastolic BP Percentile --      Pulse Rate 11/15/23 1556 70     Resp 11/15/23 1556 15     Temp 11/15/23 1556 99.1 F (37.3 C)  Temp Source 11/15/23 1556 Oral     SpO2 11/15/23 1556 98 %     Weight --      Height --      Head Circumference --      Peak Flow --      Pain Score 11/15/23 1555 0     Pain Loc --      Pain Education --      Exclude from Growth Chart --    No data found.  Updated Vital Signs BP (!) 154/91 (BP Location: Left Arm)   Pulse 70   Temp 99.1 F (37.3 C) (Oral)   Resp 15   SpO2 98%   Visual Acuity Right Eye Distance:   Left Eye Distance:   Bilateral Distance:    Right Eye Near:   Left Eye Near:    Bilateral Near:     Physical Exam   UC Treatments / Results  Labs (all labs ordered are listed, but only abnormal results are displayed) Labs Reviewed - No data to display  EKG   Radiology No results found.  Procedures Procedures (including critical care time)  Medications Ordered in UC Medications - No data to display  Initial Impression / Assessment and Plan / UC Course  I have reviewed the triage vital signs and the nursing notes.  Pertinent labs & imaging results that were available during my care of the patient were reviewed by me and considered in my medical decision making (see chart for details).     cough Final Clinical Impressions(s) / UC Diagnoses   Final diagnoses:  None     Discharge Instructions      Your show chest x-ray results are pending.  You have been prescribed benzonatate 100 mg 3 times daily as needed for 10 days.  You are encouraged to continue to treat all any other symptoms.  You have follow-up with your primary care provider if the symptoms persist worse.     ED Prescriptions      Medication Sig Dispense Auth. Provider   benzonatate (TESSALON) 100 MG capsule Take 1 capsule (100 mg total) by mouth 3 (three) times daily as needed for up to 10 days for cough. Never suck or chew on a benzonatate capsule. 30 capsule Barbette Merino, NP      PDMP not reviewed this encounter.   Barbette Merino, NP 11/15/23 1700

## 2023-11-26 NOTE — Progress Notes (Signed)
Date:  12/10/2023   Lucille Passy, PhD Date of Birth: 1929/11/29 Medical Record #[id number]  PCP:  Merri Brunette, MD  Cardiologist:  Townsend Roger  History of Present Illness:  89 y.o. with history of HTN, COPD, GERD. Chronically abnormal ECG Did not tolerated Procardia for his BP.  Seen by NP 01/11/20 found to be in afib Started on eliquis and had [facility] 06/17/20. Tends to be bradycardic in sinus And is not Rx with AV nodal drugs currently At some point reverted back to asymptomatic afib and rate control adopted   Held his DOAC for colonoscopy and EGD with Dr Russella Dar 10/10/20 and had multiple polyps removed    Walks everyday at [employer] Has a little Yorkie named Lilly that's good Company   03/05/22 Hospitalized with COPD/Pneumonia Rx with Omnicef and Azithromycin   Seems to regret being as old as he is but still active Hct 43.4  PLT 135  normal Cr 1.2 11/26/22   BP better on higher dose Norvasc   He is retired Warden/ranger Was born in [place] , grew up in [place] and went to school in [place] His daughter just moved back to [place] from there   Had COVID 10/06/23 had antiviral Rx persistent cough seen in ED 11/15/23  Rx with benzonatate CXR NAD hyperinflation    Past Medical History:  Diagnosis Date   Acute, but ill-defined, cerebrovascular disease 04/07/2014   Adenomatous colon polyp    Allergy    Atrial fibrillation (HCC)    BPH (benign prostatic hyperplasia)    Cataract    removed both eyes    COPD (chronic obstructive pulmonary disease) (HCC)    Eczema    Erectile dysfunction    Gallstones    GERD (gastroesophageal reflux disease)    Heart murmur    HOH (hard of hearing)    HTN (hypertension)    Meningioma (HCC)    Right frontal   Meralgia paraesthetica    Palpitations    Urinary retention     Past Surgical History:  Procedure Laterality Date   BLADDER DIVERTICULECTOMY  02/15/2003   Hattie Perch 05/16/2011   CARDIAC CATHETERIZATION     CARDIOVERSION N/A  06/17/2020   Procedure: CARDIOVERSION;  Surgeon: Wendall Stade, MD;  Location: [facility] ENDOSCOPY;  Service: Cardiovascular;  Laterality: N/A;   CATARACT EXTRACTION Bilateral 2013   CHOLECYSTECTOMY N/A 11/19/2014   Procedure: LAPAROSCOPIC CHOLECYSTECTOMY WITH INTRAOPERATIVE CHOLANGIOGRAM;  Surgeon: Avel Peace, MD;  Location: [facility] OR;  Service: General;  Laterality: N/A;   COLONOSCOPY     COLONOSCOPY W/ POLYPECTOMY     GUM SURGERY  1999   INGUINAL HERNIA REPAIR Left    INGUINAL HERNIA REPAIR Right 11/2009   LAPAROSCOPIC CHOLECYSTECTOMY  11/19/2014   w/IOC   MEATOTOMY  06/2003   Distal urethral meatotomy with YAG laser/notes 05/16/2011   POLYPECTOMY     SUPRAPUBIC PROSTATECTOMY  02/15/2003   Hattie Perch 05/16/2011     Medications: Current Meds  Medication Sig   acetaminophen (TYLENOL) 500 MG tablet Take 500 mg by mouth every 6 (six) hours as needed for mild pain, moderate pain or fever.   amLODipine (NORVASC) 10 MG tablet Take 10 mg by mouth daily.   chlorhexidine (PERIDEX) 0.12 % solution Use as directed 15 mLs in the mouth or throat 4 (four) times daily for 7 days.   ciclopirox (LOPROX) 0.77 % cream Apply 1 application topically daily.   dorzolamide-timolol (COSOPT) 22.3-6.8 MG/ML ophthalmic solution Place 1 drop into the left eye  2 (two) times daily.   ELIQUIS 5 MG TABS tablet TAKE 1 TABLET(5 MG) BY MOUTH TWICE DAILY   esomeprazole (NEXIUM) 20 MG capsule Take 20 mg by mouth daily.   ketotifen (ZADITOR) 0.025 % ophthalmic solution Place 1 drop into both eyes daily.   latanoprost (XALATAN) 0.005 % ophthalmic solution Place 1 drop into both eyes at bedtime.   melatonin (MELATONIN MAXIMUM STRENGTH) 5 MG TABS Take 1 tablet (5 mg total) by mouth every evening.   MELATONIN PO Take 1 tablet by mouth at bedtime.   Polyethyl Glycol-Propyl Glycol (SYSTANE OP) Place 1 drop into both eyes daily as needed (dry eyes).   terazosin (HYTRIN) 10 MG capsule Take 10 mg by mouth at bedtime.    triamcinolone  cream (KENALOG) 0.1 % Apply 1 application topically daily as needed (dry and itching skin).      Allergies: Allergies  Allergen Reactions   Aspirin     GI Bleeding Other reaction(s): upset stomach   Bee Venom     Other reaction(s): Unknown   Tamsulosin     Other reaction(s): increased frequency   Nifedipine Other (See Comments)    Headaches  Other reaction(s): headaches    Social History: The patient  reports that he quit smoking about 24 years ago. His smoking use included cigarettes. He started smoking about 64 years ago. He has a 8 pack-year smoking history. He has never used smokeless tobacco. He reports current alcohol use of about 14.0 standard drinks of alcohol per week. He reports that he does not use drugs.   Family History: The patient's family history includes Alzheimer's disease in his mother; Cancer - Lung in his sister; Heart attack in his father.   Review of Systems: Please see the history of present illness.   All other systems are reviewed and negative.   Physical Exam: VS:  BP 122/62   Pulse 61   Ht 5\' 11"  (1.803 m)   Wt 148 lb 3.2 oz (67.2 kg)   SpO2 99%   BMI 20.67 kg/m  .  BMI Body mass index is 20.67 kg/m.  Wt Readings from Last 3 Encounters:  12/10/23 148 lb 3.2 oz (67.2 kg)  12/08/23 149 lb 0.5 oz (67.6 kg)  06/14/23 149 lb (67.6 kg)   Affect appropriate Healthy:  appears stated age HEENT: normal Neck supple with no adenopathy JVP normal no bruits no thyromegaly Lungs clear with no wheezing and good diaphragmatic motion Heart:  S1/S2 no murmur, no rub, gallop or click PMI normal Abdomen: benighn, BS positve, no tenderness, no AAA no bruit.  No HSM or HJR Distal pulses intact with no bruits No edema Neuro non-focal Skin warm and dry No muscular weakness   LABORATORY DATA:  EKG:  12/10/2023   afib rate 54 chronic lateral T wave changes PVC   Lab Results  Component Value Date   WBC 9.2 03/05/2022   HGB 12.4 (L) 03/05/2022   HCT  38.4 (L) 03/05/2022   PLT 120 (L) 03/05/2022   GLUCOSE 125 (H) 03/05/2022   ALT 11 06/28/2020   AST 11 06/28/2020   NA 136 03/05/2022   K 3.7 03/05/2022   CL 104 03/05/2022   CREATININE 1.07 03/05/2022   BUN 34 (H) 03/05/2022   CO2 22 03/05/2022   TSH 2.270 01/11/2020   INR 1.14 11/11/2014    Other Studies Reviewed Today:  Echo 08/18/20    1. Left ventricular ejection fraction, by estimation, is 65 to 70%. The  left  ventricle has normal function. The left ventricle has no regional  wall motion abnormalities. There is moderate variable left ventricular  hypertrophy of the basal-septal  segment. Left ventricular diastolic parameters are consistent with  age-related delayed relaxation (normal).   2. Right ventricular systolic function is normal. The right ventricular  size is normal. Tricuspid regurgitation signal is inadequate for assessing  PA pressure.   3. Left atrial size was mildly dilated.   4. Right atrial size was mildly dilated.   5. The mitral valve is normal in structure. Mild mitral valve  regurgitation. No evidence of mitral stenosis.   6. The aortic valve is tricuspid. Aortic valve regurgitation is not  visualized. Mild aortic valve sclerosis is present, with no evidence of  aortic valve stenosis.   7. Aortic dilatation noted. There is borderline dilatation of the  ascending aorta measuring 38 mm.   8. The inferior vena cava is normal in size with greater than 50%  respiratory variability, suggesting right atrial pressure of 3 mmHg.      Assessment/Plan:  1. PAF -  DCC done 06/17/20 Asymptomatic reversion to afib Rate control and anticoagulation strategy adopted  On Eliquis with no bleeding issues   Weight > 60 kg and Cr normal 03/05/22 Echo 08/18/20 with moderate LVH EF 65-70% mild biatrial enlargement and only mild MR   2. HTN - Well controlled.  Continue current medications and low sodium Dash type diet.   Improved on higher dose of norvasc   3. Chronically  abnormal EKG with lateral T wave changes - active with no angina observe  Given age No indication for stress testing   5. GI:  F/u pathology with Dr Russella Dar regarding polyps no bleeding issues  Normal bowel habits   6. COVID:  October 2024 Rx antiviral and Benzonatate for cough CXR NAD improved    Current medicines are reviewed with the patient today.  The patient does not have concerns regarding medicines other than what has been noted above.  The following changes have been made:  None   Labs/ tests ordered today include: None   Disposition:   FU in 6 months   Signed: Charlton Haws, MD  12/10/2023 9:08 AM  [facility] [address] [place], [place]  [address] Phone: [phone] Fax: [phone]

## 2023-12-03 DIAGNOSIS — H401122 Primary open-angle glaucoma, left eye, moderate stage: Secondary | ICD-10-CM | POA: Diagnosis not present

## 2023-12-03 DIAGNOSIS — H0102B Squamous blepharitis left eye, upper and lower eyelids: Secondary | ICD-10-CM | POA: Diagnosis not present

## 2023-12-03 DIAGNOSIS — H04123 Dry eye syndrome of bilateral lacrimal glands: Secondary | ICD-10-CM | POA: Diagnosis not present

## 2023-12-03 DIAGNOSIS — H57813 Brow ptosis, bilateral: Secondary | ICD-10-CM | POA: Diagnosis not present

## 2023-12-03 DIAGNOSIS — H0102A Squamous blepharitis right eye, upper and lower eyelids: Secondary | ICD-10-CM | POA: Diagnosis not present

## 2023-12-03 DIAGNOSIS — H1045 Other chronic allergic conjunctivitis: Secondary | ICD-10-CM | POA: Diagnosis not present

## 2023-12-03 DIAGNOSIS — H4720 Unspecified optic atrophy: Secondary | ICD-10-CM | POA: Diagnosis not present

## 2023-12-03 DIAGNOSIS — H43812 Vitreous degeneration, left eye: Secondary | ICD-10-CM | POA: Diagnosis not present

## 2023-12-08 ENCOUNTER — Ambulatory Visit (HOSPITAL_COMMUNITY)
Admission: RE | Admit: 2023-12-08 | Discharge: 2023-12-08 | Disposition: A | Discharge status: Home / Self Care | Payer: Medicare Other | Source: Ambulatory Visit | Attending: Nurse Practitioner | Admitting: Nurse Practitioner

## 2023-12-08 ENCOUNTER — Encounter (HOSPITAL_COMMUNITY): Payer: Self-pay

## 2023-12-08 VITALS — BP 132/63 | HR 60 | Temp 97.9°F | Resp 16 | Ht 71.0 in | Wt 149.0 lb

## 2023-12-08 DIAGNOSIS — K148 Other diseases of tongue: Secondary | ICD-10-CM | POA: Diagnosis not present

## 2023-12-08 MED ORDER — CHLORHEXIDINE GLUCONATE 0.12 % MT SOLN: 15.0000 mL | Freq: Four times a day (QID) | OROMUCOSAL | 0 refills | Status: AC

## 2023-12-08 NOTE — Discharge Instructions (Addendum)
Start using the chlorhexidine rinse 4 times daily to help keep the germs low in your mouth and help the cut on your tongue to heal.  The cut looks like it is healing normally and it does not appear atypical.  If it recurs, worsens, or does not improve with the mouthwash, recommend close follow-up with PCP.

## 2023-12-08 NOTE — ED Triage Notes (Signed)
"  Wound on tongue (unknown origin) is bleeding and appears bruised. Started 12/05/23. - Entered by patient"  No known injuries. States woke up to the tongue bleeding. No history of issue like this. Patient is on blood thinners, Eliquis.

## 2023-12-08 NOTE — ED Provider Notes (Signed)
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   Patient presents today with significant other for wound on tongue that began 12/05/2023.  Reports he woke up and his tongue was bleeding, does not recall biting his tongue overnight or any injury to the tongue overnight while he was sleeping.  Reports the bleeding subsided on 12/06/2023, but the area is swollen and he is concerned he may have tongue cancer.  Patient takes Eliquis daily for treatment of atrial fibrillation.    Past Medical History:  Diagnosis Date   Acute, but ill-defined, cerebrovascular disease 04/07/2014   Adenomatous colon polyp    Allergy    Atrial fibrillation (HCC)    BPH (benign prostatic hyperplasia)    Cataract    removed both eyes    COPD (chronic obstructive pulmonary disease) (HCC)    Eczema    Erectile dysfunction    Gallstones    GERD (gastroesophageal reflux disease)    Heart murmur    HOH (hard of hearing)    HTN (hypertension)    Meningioma (HCC)    Right frontal   Meralgia paraesthetica    Palpitations    Urinary retention     Patient Active Problem List   Diagnosis Date Noted   Paroxysmal A-fib (HCC) 03/06/2022   Dehydration 03/06/2022   Community acquired pneumonia 03/05/2022   Symptomatic cholelithiasis 11/19/2014   Acute, but ill-defined, cerebrovascular disease 04/07/2014   Murmur 12/16/2013   Abnormal ECG 12/15/2012   Palpitations    HTN (hypertension)    GERD (gastroesophageal reflux disease)    Urinary retention    Eczema    COPD (chronic obstructive pulmonary disease) (HCC)    Erectile dysfunction    Meralgia paraesthetica     Past Surgical History:  Procedure Laterality Date   BLADDER DIVERTICULECTOMY  02/15/2003   Hattie Perch 05/16/2011   CARDIAC CATHETERIZATION     CARDIOVERSION N/A  06/17/2020   Procedure: CARDIOVERSION;  Surgeon: Wendall Stade, MD;  Location: [facility] ENDOSCOPY;  Service: Cardiovascular;  Laterality: N/A;   CATARACT EXTRACTION Bilateral 2013   CHOLECYSTECTOMY N/A 11/19/2014   Procedure: LAPAROSCOPIC CHOLECYSTECTOMY WITH INTRAOPERATIVE CHOLANGIOGRAM;  Surgeon: Avel Peace, MD;  Location: [facility] OR;  Service: General;  Laterality: N/A;   COLONOSCOPY     COLONOSCOPY W/ POLYPECTOMY     GUM SURGERY  1999   INGUINAL HERNIA REPAIR Left    INGUINAL HERNIA REPAIR Right 11/2009   LAPAROSCOPIC CHOLECYSTECTOMY  11/19/2014   w/IOC   MEATOTOMY  06/2003   Distal urethral meatotomy with YAG laser/notes 05/16/2011   POLYPECTOMY     SUPRAPUBIC PROSTATECTOMY  02/15/2003   Hattie Perch 05/16/2011       Home Medications    Prior to Admission medications   Medication Sig Start Date End Date Taking? Authorizing Provider  acetaminophen (TYLENOL) 500 MG tablet Take 500 mg by mouth every 6 (six) hours as needed for mild pain, moderate pain or fever.   Yes [provider]  amLODipine (NORVASC) 10 MG tablet Take 10 mg by mouth daily.   Yes [provider]  chlorhexidine (PERIDEX) 0.12 % solution Use as directed 15 mLs in the mouth or throat 4 (four) times daily for 7 days. 12/08/23 12/15/23 Yes Valentino Nose, NP  ciclopirox (LOPROX)  0.77 % cream Apply 1 application topically daily. 01/20/20  Yes [provider]  dorzolamide-timolol (COSOPT) 22.3-6.8 MG/ML ophthalmic solution Place 1 drop into the left eye 2 (two) times daily. 11/21/21  Yes [provider]  ELIQUIS 5 MG TABS tablet TAKE 1 TABLET(5 MG) BY MOUTH TWICE DAILY 10/04/23  Yes Wendall Stade, MD  esomeprazole (NEXIUM) 20 MG capsule Take 20 mg by mouth daily. 12/21/21  Yes [provider]  ketotifen (ZADITOR) 0.025 % ophthalmic solution Place 1 drop into both eyes daily.   Yes [provider]  latanoprost (XALATAN) 0.005 % ophthalmic solution Place 1 drop into both eyes  at bedtime. 12/14/19  Yes [provider]  melatonin (MELATONIN MAXIMUM STRENGTH) 5 MG TABS Take 1 tablet (5 mg total) by mouth every evening. 11/07/23  Yes   MELATONIN PO Take 1 tablet by mouth at bedtime.   Yes [provider]  molnupiravir EUA (LAGEVRIO) 200 MG CAPS capsule Take 4 capsules (800 mg total) by mouth 2 (two) times daily. 11/07/23  Yes Merri Brunette, MD  Polyethyl Glycol-Propyl Glycol (SYSTANE OP) Place 1 drop into both eyes daily as needed (dry eyes).   Yes [provider]  terazosin (HYTRIN) 10 MG capsule Take 10 mg by mouth at bedtime.  11/21/12  Yes [provider]  triamcinolone cream (KENALOG) 0.1 % Apply 1 application topically daily as needed (dry and itching skin).  02/09/20  Yes [provider]    Family History Family History  Problem Relation Age of Onset   Alzheimer's disease Mother    Heart attack Father    Cancer - Lung Sister    Colon cancer Neg Hx    Colon polyps Neg Hx    Esophageal cancer Neg Hx    Rectal cancer Neg Hx    Stomach cancer Neg Hx     Social History Social History   Tobacco Use   Smoking status: Former    Current packs/day: 0.00    Average packs/day: 0.2 packs/day for 40.0 years (8.0 ttl pk-yrs)    Types: Cigarettes    Start date: 11/12/1959    Quit date: 11/12/1999    Years since quitting: 24.0   Smokeless tobacco: Never  Vaping Use   Vaping status: Never Used  Substance Use Topics   Alcohol use: Yes    Alcohol/week: 14.0 standard drinks of alcohol    Types: 14 Glasses of wine per week    Comment: occ   Drug use: No     Allergies   Aspirin, Bee venom, Tamsulosin, and Nifedipine   Review of Systems Review of Systems Per HPI  Physical Exam Triage Vital Signs ED Triage Vitals  Encounter Vitals Group     BP 12/08/23 1027 132/63     Systolic BP Percentile --      Diastolic BP Percentile --      Pulse Rate 12/08/23 1027 60     Resp 12/08/23 1027 16     Temp 12/08/23 1027  97.9 F (36.6 C)     Temp Source 12/08/23 1027 Oral     SpO2 12/08/23 1027 97 %     Weight 12/08/23 1026 149 lb 0.5 oz (67.6 kg)     Height 12/08/23 1026 5\' 11"  (1.803 m)     Head Circumference --      Peak Flow --      Pain Score 12/08/23 1024 0     Pain Loc --      Pain Education --  Exclude from Growth Chart --    No data found.  Updated Vital Signs BP 132/63 (BP Location: Left Arm)   Pulse 60   Temp 97.9 F (36.6 C) (Oral)   Resp 16   Ht 5\' 11"  (1.803 m)   Wt 149 lb 0.5 oz (67.6 kg)   SpO2 97%   BMI 20.79 kg/m   Visual Acuity Right Eye Distance:   Left Eye Distance:   Bilateral Distance:    Right Eye Near:   Left Eye Near:    Bilateral Near:     Physical Exam Vitals and nursing note reviewed.  Constitutional:      General: He is not in acute distress.    Appearance: Normal appearance. He is not toxic-appearing.  HENT:     Mouth/Throat:     Mouth: Mucous membranes are moist.     Tongue: Lesions present.     Pharynx: Oropharynx is clear.      Comments: Abrasion noted to tongue and approximately area marked; no active bleeding or drainage.  No surrounding discoloration, swelling, fluctuance.  No sores on the oral mucosa or under the tongue.  Oral airway is patent. Pulmonary:     Effort: Pulmonary effort is normal. No respiratory distress.  Musculoskeletal:     Cervical back: Normal range of motion.  Lymphadenopathy:     Cervical: No cervical adenopathy.  Skin:    General: Skin is warm and dry.     Coloration: Skin is not jaundiced or pale.     Findings: No erythema.  Neurological:     Mental Status: He is alert and oriented to person, place, and time.  Psychiatric:        Behavior: Behavior is cooperative.      UC Treatments / Results  Labs (all labs ordered are listed, but only abnormal results are displayed) Labs Reviewed - No data to display  EKG   Radiology No results found.  Procedures Procedures (including critical care  time)  Medications Ordered in UC Medications - No data to display  Initial Impression / Assessment and Plan / UC Course  I have reviewed the triage vital signs and the nursing notes.  Pertinent labs & imaging results that were available during my care of the patient were reviewed by me and considered in my medical decision making (see chart for details).   Patient is well-appearing, normotensive, afebrile, not tachycardic, not tachypneic, oxygenating well on room air.    1. Tongue lesion Suspect patient may have bit tongue overnight, causing bleeding that is now resolved Area appears to be healing appropriately, start chlorhexidine rinses to help with bacterial load inside mouth and help with wound healing Reassurance provided to patient and significant other Follow-up with primary care provider if symptoms persist despite treatment  The patient was given the opportunity to ask questions.  All questions answered to their satisfaction.  The patient is in agreement to this plan.    Final Clinical Impressions(s) / UC Diagnoses   Final diagnoses:  Tongue lesion     Discharge Instructions      Start using the chlorhexidine rinse 4 times daily to help keep the germs low in your mouth and help the cut on your tongue to heal.  The cut looks like it is healing normally and it does not appear atypical.  If it recurs, worsens, or does not improve with the mouthwash, recommend close follow-up with PCP.    ED Prescriptions     Medication Sig Dispense  Auth. Provider   chlorhexidine (PERIDEX) 0.12 % solution Use as directed 15 mLs in the mouth or throat 4 (four) times daily for 7 days. 420 mL Valentino Nose, NP      PDMP not reviewed this encounter.   Valentino Nose, NP 12/08/23 [phone]

## 2023-12-10 ENCOUNTER — Ambulatory Visit: Payer: Medicare Other | Attending: Cardiovascular Disease | Admitting: Cardiovascular Disease

## 2023-12-10 ENCOUNTER — Encounter: Payer: Self-pay | Admitting: Cardiovascular Disease

## 2023-12-10 VITALS — BP 122/62 | HR 61 | Ht 71.0 in | Wt 148.2 lb

## 2023-12-10 DIAGNOSIS — I480 Paroxysmal atrial fibrillation: Secondary | ICD-10-CM

## 2023-12-10 DIAGNOSIS — I10 Essential (primary) hypertension: Secondary | ICD-10-CM | POA: Diagnosis not present

## 2023-12-10 NOTE — Patient Instructions (Signed)
Medication Instructions:  Your physician recommends that you continue on your current medications as directed. Please refer to the Current Medication list given to you today.  *If you need a refill on your cardiac medications before your next appointment, please call your pharmacy*  Lab Work: If you have labs (blood work) drawn today and your tests are completely normal, you will receive your results only by: [facility] (if you have MyChart) OR A paper copy in the mail If you have any lab test that is abnormal or we need to change your treatment, we will call you to review the results.  Testing/Procedures: None ordered today.  Follow-Up: At [facility], you and your health needs are our priority.  As part of our continuing mission to provide you with exceptional heart care, we have created designated Provider Care Teams.  These Care Teams include your primary Cardiologist (physician) and Advanced Practice Providers (APPs -  Physician Assistants and Nurse Practitioners) who all work together to provide you with the care you need, when you need it.  We recommend signing up for the patient portal called "MyChart".  Sign up information is provided on this After Visit Summary.  MyChart is used to connect with patients for Virtual Visits (Telemedicine).  Patients are able to view lab/test results, encounter notes, upcoming appointments, etc.  Non-urgent messages can be sent to your provider as well.   To learn more about what you can do with MyChart, go to [email or web address].    Your next appointment:   12 month(s)  Provider:   Jenkins Rouge, MD

## 2023-12-11 ENCOUNTER — Other Ambulatory Visit: Payer: Self-pay | Admitting: Cardiovascular Disease

## 2023-12-11 DIAGNOSIS — I480 Paroxysmal atrial fibrillation: Secondary | ICD-10-CM

## 2023-12-11 NOTE — Telephone Encounter (Signed)
Prescription refill request for Eliquis received. Indication: PAF Last office visit: 12/10/23  Burna Forts MD Scr: 1.47 on 05/22/23  [facility] Age: 89 Weight: 67.2kg  Based on above findings Eliquis 5mg  twice daily is the appropriate dose.  Refill approved.

## 2024-03-16 DIAGNOSIS — R42 Dizziness and giddiness: Secondary | ICD-10-CM | POA: Diagnosis not present

## 2024-03-16 DIAGNOSIS — R2689 Other abnormalities of gait and mobility: Secondary | ICD-10-CM | POA: Diagnosis not present

## 2024-03-20 DIAGNOSIS — I10 Essential (primary) hypertension: Secondary | ICD-10-CM | POA: Diagnosis not present

## 2024-03-25 DIAGNOSIS — Z860100 Personal history of colon polyps, unspecified: Secondary | ICD-10-CM | POA: Diagnosis not present

## 2024-03-25 DIAGNOSIS — Z0000 Encounter for general adult medical examination without abnormal findings: Secondary | ICD-10-CM | POA: Diagnosis not present

## 2024-03-25 DIAGNOSIS — R413 Other amnesia: Secondary | ICD-10-CM | POA: Diagnosis not present

## 2024-03-25 DIAGNOSIS — H8110 Benign paroxysmal vertigo, unspecified ear: Secondary | ICD-10-CM | POA: Diagnosis not present

## 2024-03-31 DIAGNOSIS — R42 Dizziness and giddiness: Secondary | ICD-10-CM | POA: Diagnosis not present

## 2024-04-25 ENCOUNTER — Telehealth: Payer: Self-pay

## 2024-04-25 ENCOUNTER — Other Ambulatory Visit: Payer: Self-pay

## 2024-04-25 ENCOUNTER — Ambulatory Visit: Payer: Self-pay

## 2024-04-25 ENCOUNTER — Ambulatory Visit (INDEPENDENT_AMBULATORY_CARE_PROVIDER_SITE_OTHER)

## 2024-04-25 ENCOUNTER — Ambulatory Visit
Admission: RE | Admit: 2024-04-25 | Discharge: 2024-04-25 | Disposition: A | Discharge status: Home / Self Care | Source: Ambulatory Visit

## 2024-04-25 VITALS — BP 128/66 | HR 74 | Temp 100.0°F | Resp 20

## 2024-04-25 DIAGNOSIS — R059 Cough, unspecified: Secondary | ICD-10-CM

## 2024-04-25 DIAGNOSIS — J309 Allergic rhinitis, unspecified: Secondary | ICD-10-CM | POA: Diagnosis not present

## 2024-04-25 DIAGNOSIS — J069 Acute upper respiratory infection, unspecified: Secondary | ICD-10-CM

## 2024-04-25 DIAGNOSIS — R509 Fever, unspecified: Secondary | ICD-10-CM | POA: Diagnosis not present

## 2024-04-25 MED ORDER — AMOXICILLIN-POT CLAVULANATE 875-125 MG PO TABS: 1.0000 | ORAL_TABLET | Freq: Two times a day (BID) | ORAL | 0 refills | Status: DC

## 2024-04-25 MED ORDER — ACETAMINOPHEN 500 MG PO TABS
1000.0000 mg | ORAL_TABLET | ORAL | Status: AC
  Administered 2024-04-25: 1000 mg via ORAL

## 2024-04-25 MED ORDER — FEXOFENADINE HCL 180 MG PO TABS: 180.0000 mg | ORAL_TABLET | Freq: Every day | ORAL | 0 refills | Status: DC

## 2024-04-25 NOTE — ED Triage Notes (Addendum)
 Cough, fever, rattling in chest x 1 week. Was exposed to grandchildren who have metapneumovirus. Has had cough medicine for high bp, tylenol . Fever to 102.

## 2024-04-25 NOTE — Discharge Instructions (Addendum)
 Advised patient to take medications as directed with food to completion.  Advised patient to take Allegra with first dose of Augmentin for the next 2 to 5 days.  Advised may use Allegra as needed afterwards for concurrent postnasal drainage/drip.  Advised patient/wife may take OTC Tylenol  1000 mg every 6 hours for fever (oral temperature greater than 100.3).  Advised if symptoms worsen and/or unresolved please follow-up with your PCP or here for further evaluation.

## 2024-04-25 NOTE — ED Provider Notes (Signed)
 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   HPI Pleasant 89 year old male presents with cough, fever and rattling in the chest for 1 week.  Patient reports was exposed to grandchildren who have Mehta pneumonia virus.  PMH significant for A-fib, COPD, and HTN.  Patient is accompanied by his wife this afternoon.  Patient is currently on apixaban  and denies any unusual bleeding.  Past Medical History:  Diagnosis Date   Acute, but ill-defined, cerebrovascular disease 04/07/2014   Adenomatous colon polyp    Allergy    Atrial fibrillation (HCC)    BPH (benign prostatic hyperplasia)    Cataract    removed both eyes    COPD (chronic obstructive pulmonary disease) (HCC)    Eczema    Erectile dysfunction    Gallstones    GERD (gastroesophageal reflux disease)    Heart murmur    HOH (hard of hearing)    HTN (hypertension)    Meningioma (HCC)    Right frontal   Meralgia paraesthetica    Palpitations    Urinary retention     Patient Active Problem List   Diagnosis Date Noted   Paroxysmal A-fib (HCC) 03/06/2022   Dehydration 03/06/2022   Community acquired pneumonia 03/05/2022   Symptomatic cholelithiasis 11/19/2014   Acute, but ill-defined, cerebrovascular disease 04/07/2014   Murmur 12/16/2013   Abnormal ECG 12/15/2012   Palpitations    HTN (hypertension)    GERD (gastroesophageal reflux disease)    Urinary retention    Eczema    COPD (chronic obstructive pulmonary disease) (HCC)    Erectile dysfunction    Meralgia paraesthetica     Past Surgical History:  Procedure Laterality Date   BLADDER DIVERTICULECTOMY  02/15/2003   Maximo Spar 05/16/2011   CARDIAC CATHETERIZATION     CARDIOVERSION N/A 06/17/2020   Procedure: CARDIOVERSION;  Surgeon: Loyde Rule, MD;  Location: [facility] ENDOSCOPY;  Service: Cardiovascular;   Laterality: N/A;   CATARACT EXTRACTION Bilateral 2013   CHOLECYSTECTOMY N/A 11/19/2014   Procedure: LAPAROSCOPIC CHOLECYSTECTOMY WITH INTRAOPERATIVE CHOLANGIOGRAM;  Surgeon: Adalberto Hollow, MD;  Location: [facility] OR;  Service: General;  Laterality: N/A;   COLONOSCOPY     COLONOSCOPY W/ POLYPECTOMY     GUM SURGERY  1999   INGUINAL HERNIA REPAIR Left    INGUINAL HERNIA REPAIR Right 11/2009   LAPAROSCOPIC CHOLECYSTECTOMY  11/19/2014   w/IOC   MEATOTOMY  06/2003   Distal urethral meatotomy with YAG laser/notes 05/16/2011   POLYPECTOMY     SUPRAPUBIC PROSTATECTOMY  02/15/2003   Maximo Spar 05/16/2011       Home Medications    Prior to Admission medications   Medication Sig Start Date End Date Taking? Authorizing Provider  amoxicillin-clavulanate (AUGMENTIN) 875-125 MG tablet Take 1 tablet by mouth every 12 (twelve) hours. 04/25/24  Yes Leonides Ramp, FNP  cholecalciferol (VITAMIN D3) 25 MCG (1000 UNIT) tablet Take 1,000 Units by mouth daily.   Yes [provider]  fexofenadine (ALLEGRA ALLERGY) 180 MG tablet Take 1 tablet (180 mg total) by mouth daily for 15 days. 04/25/24 05/10/24 Yes Leonides Ramp, FNP  acetaminophen  (TYLENOL ) 500 MG tablet Take 500 mg by mouth every 6 (six) hours as needed for mild pain, moderate pain or fever.    [provider]  amLODipine  (NORVASC ) 10 MG tablet  Take 10 mg by mouth daily.    [provider]  apixaban  (ELIQUIS ) 5 MG TABS tablet TAKE 1 TABLET(5 MG) BY MOUTH TWICE DAILY 12/11/23   Nishan, Peter C, MD  ciclopirox (LOPROX) 0.77 % cream Apply 1 application topically daily. 01/20/20   [provider]  dorzolamide -timolol  (COSOPT ) 22.3-6.8 MG/ML ophthalmic solution Place 1 drop into the left eye 2 (two) times daily. 11/21/21   [provider]  esomeprazole  (NEXIUM ) 20 MG capsule Take 20 mg by mouth daily. 12/21/21   [provider]  ketotifen  (ZADITOR ) 0.025 % ophthalmic solution Place 1 drop into both eyes daily.     [provider]  latanoprost  (XALATAN ) 0.005 % ophthalmic solution Place 1 drop into both eyes at bedtime. 12/14/19   [provider]  melatonin (MELATONIN MAXIMUM STRENGTH) 5 MG TABS Take 1 tablet (5 mg total) by mouth every evening. 11/07/23     MELATONIN PO Take 1 tablet by mouth at bedtime.    [provider]  Polyethyl Glycol-Propyl Glycol (SYSTANE OP) Place 1 drop into both eyes daily as needed (dry eyes).    [provider]  terazosin  (HYTRIN ) 10 MG capsule Take 10 mg by mouth at bedtime.  11/21/12   [provider]  triamcinolone cream (KENALOG) 0.1 % Apply 1 application topically daily as needed (dry and itching skin).  02/09/20   [provider]    Family History Family History  Problem Relation Age of Onset   Alzheimer's disease Mother    Heart attack Father    Cancer - Lung Sister    Colon cancer Neg Hx    Colon polyps Neg Hx    Esophageal cancer Neg Hx    Rectal cancer Neg Hx    Stomach cancer Neg Hx     Social History Social History   Tobacco Use   Smoking status: Former    Current packs/day: 0.00    Average packs/day: 0.2 packs/day for 40.0 years (8.0 ttl pk-yrs)    Types: Cigarettes    Start date: 11/12/1959    Quit date: 11/12/1999    Years since quitting: 24.4   Smokeless tobacco: Never  Vaping Use   Vaping status: Never Used  Substance Use Topics   Alcohol use: Yes    Alcohol/week: 14.0 standard drinks of alcohol    Types: 14 Glasses of wine per week    Comment: occ   Drug use: No     Allergies   Aspirin, Bee venom, Tamsulosin, and Nifedipine   Review of Systems Review of Systems  Respiratory:  Positive for cough.   All other systems reviewed and are negative.    Physical Exam Triage Vital Signs ED Triage Vitals  Encounter Vitals Group     BP      Systolic BP Percentile      Diastolic BP Percentile      Pulse      Resp      Temp      Temp src      SpO2      Weight      Height       Head Circumference      Peak Flow      Pain Score      Pain Loc      Pain Education      Exclude from Growth Chart    No data found.  Updated Vital Signs BP 128/66   Pulse 74   Temp 100 F (37.8  C) (Oral)   Resp 20   SpO2 95%   Physical Exam Vitals and nursing note reviewed.  Constitutional:      Appearance: Normal appearance. He is normal weight. He is ill-appearing.  HENT:     Head: Normocephalic and atraumatic.     Right Ear: Tympanic membrane, ear canal and external ear normal.     Left Ear: Tympanic membrane, ear canal and external ear normal.     Mouth/Throat:     Mouth: Mucous membranes are moist.     Pharynx: Oropharynx is clear.     Comments: Significant amount of clear drainage of posterior oropharynx noted Eyes:     Extraocular Movements: Extraocular movements intact.     Conjunctiva/sclera: Conjunctivae normal.     Pupils: Pupils are equal, round, and reactive to light.  Cardiovascular:     Rate and Rhythm: Normal rate and regular rhythm.     Pulses: Normal pulses.     Heart sounds: Normal heart sounds.  Pulmonary:     Effort: Pulmonary effort is normal.     Breath sounds: Normal breath sounds. No wheezing, rhonchi or rales.     Comments: Infrequent nonproductive cough on exam Musculoskeletal:        General: Normal range of motion.     Cervical back: Normal range of motion and neck supple.  Skin:    General: Skin is warm and dry.  Neurological:     General: No focal deficit present.     Mental Status: He is alert and oriented to person, place, and time. Mental status is at baseline.  Psychiatric:        Mood and Affect: Mood normal.        Behavior: Behavior normal.      UC Treatments / Results  Labs (all labs ordered are listed, but only abnormal results are displayed) Labs Reviewed - No data to display  EKG   Radiology DG Chest 2 View Result Date: 04/25/2024 CLINICAL DATA:  Cough and fever to 102 EXAM: CHEST - 2 VIEW COMPARISON:   11/15/2023 FINDINGS: Large lung volumes, especially on the lateral view. There is no edema, consolidation, effusion, or pneumothorax. Normal heart size and aortic contours. Multiple ossified bodies medial to the left shoulder. IMPRESSION: Negative for pneumonia. Electronically Signed   By: Ronnette Coke M.D.   On: 04/25/2024 12:57    Procedures Procedures (including critical care time)  Medications Ordered in UC Medications  acetaminophen  (TYLENOL ) tablet 1,000 mg (1,000 mg Oral Given 04/25/24 1315)    Initial Impression / Assessment and Plan / UC Course  I have reviewed the triage vital signs and the nursing notes.  Pertinent labs & imaging results that were available during my care of the patient were reviewed by me and considered in my medical decision making (see chart for details).     MDM: 1.  Acute URI-Augmentin 875/125 mg tablet: Take 1 tablet twice daily x 7 days; 2.  Cough, unspecified type-same as 1.  Rx'd Augmentin 875/125 mg tablet: Take 1 tablet twice daily x 7 days; 3.  Allergic rhinitis, unspecified seasonality, unspecified trigger-Rx'd Allegra 180 mg fexofenadine daily x 5 days, then as needed; 4.  Fever, unspecified-Tylenol  1000 mg given once in clinic and prior to discharge today. Advised patient to take medications as directed with food to completion.  Advised patient to take Allegra with first dose of Augmentin for the next 2 to 5 days.  Advised may use Allegra as needed afterwards for concurrent  postnasal drainage/drip.  Advised patient/wife may take OTC Tylenol  1000 mg every 6 hours for fever (oral temperature greater than 100.3).  Advised if symptoms worsen and/or unresolved please follow-up with your PCP or here for further evaluation.  Patient discharged home, hemodynamically stable. Final Clinical Impressions(s) / UC Diagnoses   Final diagnoses:  Allergic rhinitis, unspecified seasonality, unspecified trigger  Cough, unspecified type  Acute URI  Fever, unspecified      Discharge Instructions      Advised patient to take medications as directed with food to completion.  Advised patient to take Allegra with first dose of Augmentin for the next 2 to 5 days.  Advised may use Allegra as needed afterwards for concurrent postnasal drainage/drip.  Advised patient/wife may take OTC Tylenol  1000 mg every 6 hours for fever (oral temperature greater than 100.3).  Advised if symptoms worsen and/or unresolved please follow-up with your PCP or here for further evaluation.     ED Prescriptions     Medication Sig Dispense Auth. Provider   amoxicillin-clavulanate (AUGMENTIN) 875-125 MG tablet Take 1 tablet by mouth every 12 (twelve) hours. 14 tablet [place], FNP   fexofenadine (ALLEGRA ALLERGY) 180 MG tablet Take 1 tablet (180 mg total) by mouth daily for 15 days. 15 tablet Anye Brose, FNP      PDMP not reviewed this encounter.   Leonides Ramp, FNP 04/25/24 1315

## 2024-04-25 NOTE — Telephone Encounter (Signed)
 Pt wanted pharmacy changed to [facility] on [address], this was performed and rx resent.

## 2024-05-12 DIAGNOSIS — R350 Frequency of micturition: Secondary | ICD-10-CM | POA: Diagnosis not present

## 2024-06-01 ENCOUNTER — Other Ambulatory Visit (HOSPITAL_COMMUNITY): Payer: Self-pay

## 2024-06-03 DIAGNOSIS — H4720 Unspecified optic atrophy: Secondary | ICD-10-CM | POA: Diagnosis not present

## 2024-06-03 DIAGNOSIS — H57813 Brow ptosis, bilateral: Secondary | ICD-10-CM | POA: Diagnosis not present

## 2024-06-03 DIAGNOSIS — H0102A Squamous blepharitis right eye, upper and lower eyelids: Secondary | ICD-10-CM | POA: Diagnosis not present

## 2024-06-03 DIAGNOSIS — Z961 Presence of intraocular lens: Secondary | ICD-10-CM | POA: Diagnosis not present

## 2024-06-03 DIAGNOSIS — H0102B Squamous blepharitis left eye, upper and lower eyelids: Secondary | ICD-10-CM | POA: Diagnosis not present

## 2024-06-03 DIAGNOSIS — H1045 Other chronic allergic conjunctivitis: Secondary | ICD-10-CM | POA: Diagnosis not present

## 2024-06-03 DIAGNOSIS — H43812 Vitreous degeneration, left eye: Secondary | ICD-10-CM | POA: Diagnosis not present

## 2024-06-03 DIAGNOSIS — H401122 Primary open-angle glaucoma, left eye, moderate stage: Secondary | ICD-10-CM | POA: Diagnosis not present

## 2024-06-03 DIAGNOSIS — H04123 Dry eye syndrome of bilateral lacrimal glands: Secondary | ICD-10-CM | POA: Diagnosis not present

## 2024-06-08 ENCOUNTER — Encounter: Payer: Self-pay | Admitting: Infectious Diseases

## 2024-06-08 ENCOUNTER — Other Ambulatory Visit: Payer: Self-pay

## 2024-06-08 ENCOUNTER — Ambulatory Visit: Admitting: Infectious Diseases

## 2024-06-08 VITALS — BP 108/72 | HR 61 | Temp 97.8°F | Ht 71.0 in | Wt 141.0 lb

## 2024-06-08 DIAGNOSIS — Z22358 Carrier of other enterobacterales: Secondary | ICD-10-CM

## 2024-06-08 DIAGNOSIS — R319 Hematuria, unspecified: Secondary | ICD-10-CM

## 2024-06-08 DIAGNOSIS — R8271 Bacteriuria: Secondary | ICD-10-CM

## 2024-06-08 NOTE — Progress Notes (Addendum)
 Patient Active Problem List   Diagnosis Date Noted   Paroxysmal A-fib (HCC) 03/06/2022   Dehydration 03/06/2022   Community acquired pneumonia 03/05/2022   Symptomatic cholelithiasis 11/19/2014   Acute, but ill-defined, cerebrovascular disease 04/07/2014   Murmur 12/16/2013   Abnormal ECG 12/15/2012   Palpitations    HTN (hypertension)    GERD (gastroesophageal reflux disease)    Urinary retention    Eczema    COPD (chronic obstructive pulmonary disease) (HCC)    Erectile dysfunction    Meralgia paraesthetica     Patient's Medications  New Prescriptions   No medications on file  Previous Medications   ACETAMINOPHEN  (TYLENOL ) 500 MG TABLET    Take 500 mg by mouth every 6 (six) hours as needed for mild pain, moderate pain or fever.   AMLODIPINE  (NORVASC ) 10 MG TABLET    Take 10 mg by mouth daily.   AMOXICILLIN -CLAVULANATE (AUGMENTIN ) 875-125 MG TABLET    Take 1 tablet by mouth every 12 (twelve) hours.   APIXABAN  (ELIQUIS ) 5 MG TABS TABLET    TAKE 1 TABLET(5 MG) BY MOUTH TWICE DAILY   CHOLECALCIFEROL (VITAMIN D3) 25 MCG (1000 UNIT) TABLET    Take 1,000 Units by mouth daily.   CICLOPIROX (LOPROX) 0.77 % CREAM    Apply 1 application topically daily.   DORZOLAMIDE -TIMOLOL  (COSOPT ) 22.3-6.8 MG/ML OPHTHALMIC SOLUTION    Place 1 drop into the left eye 2 (two) times daily.   ESOMEPRAZOLE  (NEXIUM ) 20 MG CAPSULE    Take 20 mg by mouth daily.   FEXOFENADINE  (ALLEGRA  ALLERGY) 180 MG TABLET    Take 1 tablet (180 mg total) by mouth daily for 15 days.   KETOTIFEN  (ZADITOR ) 0.025 % OPHTHALMIC SOLUTION    Place 1 drop into both eyes daily.   LATANOPROST  (XALATAN ) 0.005 % OPHTHALMIC SOLUTION    Place 1 drop into both eyes at bedtime.   MELATONIN (MELATONIN MAXIMUM STRENGTH) 5 MG TABS    Take 1 tablet (5 mg total) by mouth every evening.   MELATONIN PO    Take 1 tablet by mouth at bedtime.   POLYETHYL GLYCOL-PROPYL GLYCOL (SYSTANE OP)    Place 1 drop into both eyes daily as needed (dry eyes).    TERAZOSIN  (HYTRIN ) 10 MG CAPSULE    Take 10 mg by mouth at bedtime.    TRIAMCINOLONE CREAM (KENALOG) 0.1 %    Apply 1 application topically daily as needed (dry and itching skin).   Modified Medications   No medications on file  Discontinued Medications   No medications on file    Subjective: Discussed the use of AI scribe software for clinical note transcription with the patient, who gave verbal consent to proceed.   89 Y O male with prior h/o A fib, BPH s/p suprapubic prostatectomy and diverticulectomy 2012, COPD, HTN, meningioma who is referred from PCP for ESBL UTI by PCP.   Patient accompanied by male friend, helping with h/o as he is hard of hearing, He initially presented with hematuria on May 15th, leading to a diagnosis of a urinary tract infection and treatment with course of doxycycline, which resolved the symptoms. Two days ago, hematuria recurred without fever, chills, nausea, vomiting, diarrhea, back pain, suprapubic pain, flank pain, or urinary urgency. He experiences frequent urination but no dysuria.  He had elevated PSA levels and a history of prostate surgery in the past. Denies history of nephrolithiasis.  He quit smoking thirty years ago and consumes minimal alcohol. He manages recent constipation with  prunes and increased water intake.  No GU symptoms currently.   Review of Systems: all systems reviewed with pertinent positives and negatives as listed above   Past Medical History:  Diagnosis Date   Acute, but ill-defined, cerebrovascular disease 04/07/2014   Adenomatous colon polyp    Allergy    Atrial fibrillation (HCC)    BPH (benign prostatic hyperplasia)    Cataract    removed both eyes    COPD (chronic obstructive pulmonary disease) (HCC)    Eczema    Erectile dysfunction    Gallstones    GERD (gastroesophageal reflux disease)    Heart murmur    HOH (hard of hearing)    HTN (hypertension)    Meningioma (HCC)    Right frontal   Meralgia  paraesthetica    Palpitations    Urinary retention    Past Surgical History:  Procedure Laterality Date   BLADDER DIVERTICULECTOMY  02/15/2003   Maximo Spar 05/16/2011   CARDIAC CATHETERIZATION     CARDIOVERSION N/A 06/17/2020   Procedure: CARDIOVERSION;  Surgeon: Loyde Rule, MD;  Location: [facility] ENDOSCOPY;  Service: Cardiovascular;  Laterality: N/A;   CATARACT EXTRACTION Bilateral 2013   CHOLECYSTECTOMY N/A 11/19/2014   Procedure: LAPAROSCOPIC CHOLECYSTECTOMY WITH INTRAOPERATIVE CHOLANGIOGRAM;  Surgeon: Adalberto Hollow, MD;  Location: [facility] OR;  Service: General;  Laterality: N/A;   COLONOSCOPY     COLONOSCOPY W/ POLYPECTOMY     GUM SURGERY  1999   INGUINAL HERNIA REPAIR Left    INGUINAL HERNIA REPAIR Right 11/2009   LAPAROSCOPIC CHOLECYSTECTOMY  11/19/2014   w/IOC   MEATOTOMY  06/2003   Distal urethral meatotomy with YAG laser/notes 05/16/2011   POLYPECTOMY     SUPRAPUBIC PROSTATECTOMY  02/15/2003   Maximo Spar 05/16/2011     Social History   Tobacco Use   Smoking status: Former    Current packs/day: 0.00    Average packs/day: 0.2 packs/day for 40.0 years (8.0 ttl pk-yrs)    Types: Cigarettes    Start date: 11/12/1959    Quit date: 11/12/1999    Years since quitting: 24.5   Smokeless tobacco: Never  Vaping Use   Vaping status: Never Used  Substance Use Topics   Alcohol use: Yes    Alcohol/week: 14.0 standard drinks of alcohol    Types: 14 Glasses of wine per week    Comment: occ   Drug use: No    Family History  Problem Relation Age of Onset   Alzheimer's disease Mother    Heart attack Father    Cancer - Lung Sister    Colon cancer Neg Hx    Colon polyps Neg Hx    Esophageal cancer Neg Hx    Rectal cancer Neg Hx    Stomach cancer Neg Hx     Allergies  Allergen Reactions   Aspirin     GI Bleeding Other reaction(s): upset stomach   Bee Venom     Other reaction(s): Unknown   Tamsulosin     Other reaction(s): increased frequency   Nifedipine Other (See Comments)     Headaches  Other reaction(s): headaches    Health Maintenance  Topic Date Due   DTaP/Tdap/Td (1 - Tdap) Never done   Pneumonia Vaccine 9+ Years old (1 of 2 - PCV) 01/16/1948   COVID-19 Vaccine (2 - Pfizer risk series) 04/10/2023   INFLUENZA VACCINE  07/31/2024   Medicare Annual Wellness (AWV)  03/25/2025   Zoster Vaccines- Shingrix  Completed   HPV VACCINES  Aged Out  Meningococcal B Vaccine  Aged Out    Objective:  Vitals:   06/08/24 0950  BP: 108/72  Pulse: 61  Temp: 97.8 F (36.6 C)  TempSrc: Temporal  SpO2: 98%  Weight: 141 lb (64 kg)  Height: 5' 11 (1.803 m)   Body mass index is 19.67 kg/m.  Physical Exam Constitutional:      Appearance: Normal appearance. Hard of hearing HENT:     Head: Normocephalic and atraumatic.      Mouth: Mucous membranes are moist.  Eyes:    Conjunctiva/sclera: Conjunctivae normal.     Pupils: Pupils are equal, round, and b/l symmetrical    Cardiovascular:     Rate and Rhythm: Normal rate and Irregular rhythm.     Heart sounds: s1s2  Pulmonary:     Effort: Pulmonary effort is normal.     Breath sounds: Normal breath sounds.   Abdominal:     General: Non distended     Palpations: soft. Non tender   Musculoskeletal:        General: Normal range of motion.   Skin:    General: Skin is warm and dry.     Comments:  Neurological:     General: grossly non focal     Mental Status: awake, alert and oriented to person, place, and time.   Psychiatric:        Mood and Affect: Mood normal.   Lab Results Lab Results  Component Value Date   WBC 9.2 03/05/2022   HGB 12.4 (L) 03/05/2022   HCT 38.4 (L) 03/05/2022   MCV 83.8 03/05/2022   PLT 120 (L) 03/05/2022    Lab Results  Component Value Date   CREATININE 1.07 03/05/2022   BUN 34 (H) 03/05/2022   NA 136 03/05/2022   K 3.7 03/05/2022   CL 104 03/05/2022   CO2 22 03/05/2022    Lab Results  Component Value Date   ALT 11 06/28/2020   AST 11 06/28/2020   ALKPHOS 78  06/28/2020   BILITOT 1.1 06/28/2020    No results found for: CHOL, HDL, LDLCALC, LDLDIRECT, TRIG, CHOLHDL No results found for: LABRPR, RPRTITER No results found for: HIV1RNAQUANT, HIV1RNAVL, CD4TABS   Microbiology Results for orders placed or performed during the hospital encounter of 03/05/22  Resp Panel by RT-PCR (Flu A&B, Covid) Nasopharyngeal Swab     Status: None   Collection Time: 03/05/22  5:49 PM   Specimen: Nasopharyngeal Swab; Nasopharyngeal(NP) swabs in vial transport medium  Result Value Ref Range Status   SARS Coronavirus 2 by RT PCR NEGATIVE NEGATIVE Final    Comment: (NOTE) SARS-CoV-2 target nucleic acids are NOT DETECTED.  The SARS-CoV-2 RNA is generally detectable in upper respiratory specimens during the acute phase of infection. The lowest concentration of SARS-CoV-2 viral copies this assay can detect is 138 copies/mL. A negative result does not preclude SARS-Cov-2 infection and should not be used as the sole basis for treatment or other patient management decisions. A negative result may occur with  improper specimen collection/handling, submission of specimen other than nasopharyngeal swab, presence of viral mutation(s) within the areas targeted by this assay, and inadequate number of viral copies(<138 copies/mL). A negative result must be combined with clinical observations, patient history, and epidemiological information. The expected result is Negative.  Fact Sheet for Patients:  BloggerCourse.com  Fact Sheet for Healthcare Providers:  SeriousBroker.it  This test is no t yet approved or cleared by the [place]  FDA and  has been authorized for detection  and/or diagnosis of SARS-CoV-2 by FDA under an Emergency Use Authorization (EUA). This EUA will remain  in effect (meaning this test can be used) for the duration of the COVID-19 declaration under Section 564(b)(1) of the Act,  21 U.S.C.section 360bbb-3(b)(1), unless the authorization is terminated  or revoked sooner.       Influenza A by PCR NEGATIVE NEGATIVE Final   Influenza B by PCR NEGATIVE NEGATIVE Final    Comment: (NOTE) The Xpert Xpress SARS-CoV-2/FLU/RSV plus assay is intended as an aid in the diagnosis of influenza from Nasopharyngeal swab specimens and should not be used as a sole basis for treatment. Nasal washings and aspirates are unacceptable for Xpert Xpress SARS-CoV-2/FLU/RSV testing.  Fact Sheet for Patients: BloggerCourse.com  Fact Sheet for Healthcare Providers: SeriousBroker.it  This test is not yet approved or cleared by the [place]  FDA and has been authorized for detection and/or diagnosis of SARS-CoV-2 by FDA under an Emergency Use Authorization (EUA). This EUA will remain in effect (meaning this test can be used) for the duration of the COVID-19 declaration under Section 564(b)(1) of the Act, 21 U.S.C. section 360bbb-3(b)(1), unless the authorization is terminated or revoked.  Performed at [facility], [address] [place], [place] [address]   Expectorated Sputum Assessment w Gram Stain, Rflx to Resp Cult     Status: None   Collection Time: 03/06/22  5:10 PM   Specimen: Sputum  Result Value Ref Range Status   Specimen Description SPU EXPECTORATED  Final   Special Requests NONE  Final   Sputum evaluation   Final    THIS SPECIMEN IS ACCEPTABLE FOR SPUTUM CULTURE Performed at [facility], [address] [place], [place] [address]    Report Status 03/06/2022 FINAL  Final  Culture, Respiratory w Gram Stain     Status: None   Collection Time: 03/06/22  5:10 PM   Specimen: Sputum  Result Value Ref Range Status   Specimen Description   Final    SPU EXPECTORATED Performed at [facility], [address] [place], [place] [address]    Special Requests    Final    NONE Reflexed from [id number] Performed at [facility], [address], [place], [place] [address]    Gram Stain   Final    ABUNDANT WBC PRESENT,BOTH PMN AND MONONUCLEAR MODERATE GRAM VARIABLE ROD FEW GRAM POSITIVE COCCI    Culture   Final    FEW Normal respiratory flora-no Staph aureus or Pseudomonas seen Performed at [facility], [address] [place], [place] [address]    Report Status 03/09/2022 FINAL  Final      Imaging 07/27/20 CT abdomen/pelvis  IMPRESSION: 1. No acute findings in the abdomen or pelvis. Specifically, no findings to explain the patient's history of abdominal pain and weight loss. 2. Splenomegaly. 3. Small to moderate hiatal hernia. 4. Bilateral renal cysts. 5. Prostatomegaly. 6. Aortic Atherosclerosis (ICD10-I70.0).  Assessment/Plan # Possible urinary colonization vs UTI - 5/13 UA cloudy, WBC esterase 3+, occult blood 3+, WBC >30, many yeast; Urine cx greater than 10,000 cfu/ml ESBL Kleb pneumoniae - Patient reports symptoms improved with doxycycline which is actually R and hence, likely colonization  - discussed at length about urinary colonization, infection and indication of antibiotics only when symptomatic. No indication of antibiotics currently  - fu with Urology for hematuria - fu with us  as needed/worsening symptoms   I spent 60 minutes involved in face-to-face and non-face-to-face activities for  this patient on the day of the visit. Professional time spent includes the following activities: Preparing to see the patient (review of tests), Obtaining and/or reviewing separately obtained history (admission/discharge record), Performing a medically appropriate examination and/or evaluation , Ordering medications, referring and communicating with other health care professionals, Documenting clinical information in the EMR, Independently interpreting results (not separately reported), Communicating results to the  patient/family/caregiver, Counseling and educating the patient/family/caregiver and Care coordination (not separately reported).   Of note, portions of this note may have been created with voice recognition software. While this note has been edited for accuracy, occasional wrong-word or 'sound-a-like' substitutions may have occurred due to the inherent limitations of voice recognition software.   Melvina Stage, MD [facility] 06/08/2024, 9:56 AM

## 2024-06-13 DIAGNOSIS — R319 Hematuria, unspecified: Secondary | ICD-10-CM | POA: Insufficient documentation

## 2024-07-07 ENCOUNTER — Other Ambulatory Visit: Payer: Self-pay | Admitting: Cardiovascular Disease

## 2024-07-07 DIAGNOSIS — I480 Paroxysmal atrial fibrillation: Secondary | ICD-10-CM

## 2024-07-07 NOTE — Telephone Encounter (Signed)
 Prescription refill request for Eliquis  received. Indication: Afib  Last office visit: 12/10/23 [place]) Scr: 1.21 (03/20/24)  Age: 89 Weight: 64kg  Appropriate dose. Refill sent.

## 2024-07-10 ENCOUNTER — Ambulatory Visit (INDEPENDENT_AMBULATORY_CARE_PROVIDER_SITE_OTHER): Payer: Medicare Other | Admitting: Otolaryngology

## 2024-07-10 ENCOUNTER — Encounter (INDEPENDENT_AMBULATORY_CARE_PROVIDER_SITE_OTHER): Payer: Self-pay | Admitting: Otolaryngology

## 2024-07-10 VITALS — BP 107/73 | HR 58

## 2024-07-10 DIAGNOSIS — H903 Sensorineural hearing loss, bilateral: Secondary | ICD-10-CM | POA: Insufficient documentation

## 2024-07-10 DIAGNOSIS — H6123 Impacted cerumen, bilateral: Secondary | ICD-10-CM | POA: Diagnosis not present

## 2024-07-10 NOTE — Progress Notes (Signed)
 Patient ID: Jared Mannan, PhD, male   DOB: 1929/02/02, 89 y.o.   MRN: [id number]  Follow-up: Hearing loss  HPI: The patient is a 89 year old male who returns today for his follow-up evaluation.  He was last seen in July 2024.  At that time, he was complaining of bilateral hearing loss.  He was previously fitted with bilateral hearing aids.  He also has a history of recurrent cerumen impaction.  The patient returns today reporting no significant change in his hearing.  He uses his hearing aids intermittently.  He denies any otalgia, otorrhea, or vertigo.  Exam: General: Communicates without difficulty, well nourished, no acute distress. Head: Normocephalic, no evidence injury, no tenderness, facial buttresses intact without stepoff. Face/sinus: No tenderness to palpation and percussion. Facial movement is normal and symmetric. Eyes: PERRL, EOMI. No scleral icterus, conjunctivae clear. Neuro: CN II exam reveals vision grossly intact.  No nystagmus at any point of gaze. Ears: Auricles well formed without lesions.  Bilateral cerumen impaction.  Nose: External evaluation reveals normal support and skin without lesions.  Dorsum is intact.  Anterior rhinoscopy reveals congested mucosa over anterior aspect of inferior turbinates and intact septum.  No purulence noted. Oral:  Oral cavity and oropharynx are intact, symmetric, without erythema or edema.  Mucosa is moist without lesions. Neck: Full range of motion without pain.  There is no significant lymphadenopathy.  No masses palpable.  Thyroid  bed within normal limits to palpation.  Parotid glands and submandibular glands equal bilaterally without mass.  Trachea is midline. Neuro:  CN 2-12 grossly intact.   Procedure: Bilateral cerumen disimpaction Anesthesia: None Description: Under the operating microscope, the cerumen is carefully removed with a combination of cerumen currette, alligator forceps, and suction catheters.  After the cerumen is removed, the TMs  are noted to be normal.  No mass, erythema, or lesions. The patient tolerated the procedure well.    Assessment: 1.  Bilateral cerumen impaction.  After the disimpaction procedure, both tympanic membranes and middle ear spaces are noted to be normal. 2.  Subjectively stable bilateral high-frequency sensorineural hearing loss.  Plan: 1.  Otomicroscopy with bilateral cerumen disimpaction. 2.  The physical exam findings are reviewed with the patient. 3.  The patient is encouraged to use his bilateral hearing aids. 4.  The patient will return for reevaluation in 1 year.

## 2024-08-04 DIAGNOSIS — M25551 Pain in right hip: Secondary | ICD-10-CM | POA: Diagnosis not present

## 2024-08-06 DIAGNOSIS — M25551 Pain in right hip: Secondary | ICD-10-CM | POA: Diagnosis not present

## 2024-08-18 DIAGNOSIS — L821 Other seborrheic keratosis: Secondary | ICD-10-CM | POA: Diagnosis not present

## 2024-08-18 DIAGNOSIS — L853 Xerosis cutis: Secondary | ICD-10-CM | POA: Diagnosis not present

## 2024-08-18 DIAGNOSIS — L812 Freckles: Secondary | ICD-10-CM | POA: Diagnosis not present

## 2024-08-18 DIAGNOSIS — D225 Melanocytic nevi of trunk: Secondary | ICD-10-CM | POA: Diagnosis not present

## 2024-08-18 DIAGNOSIS — D1801 Hemangioma of skin and subcutaneous tissue: Secondary | ICD-10-CM | POA: Diagnosis not present

## 2024-09-15 DIAGNOSIS — M7061 Trochanteric bursitis, right hip: Secondary | ICD-10-CM | POA: Diagnosis not present

## 2024-09-20 DIAGNOSIS — Z23 Encounter for immunization: Secondary | ICD-10-CM | POA: Diagnosis not present

## 2024-09-24 DIAGNOSIS — I480 Paroxysmal atrial fibrillation: Secondary | ICD-10-CM | POA: Diagnosis not present

## 2024-09-24 DIAGNOSIS — Z23 Encounter for immunization: Secondary | ICD-10-CM | POA: Diagnosis not present

## 2024-09-24 DIAGNOSIS — D6869 Other thrombophilia: Secondary | ICD-10-CM | POA: Diagnosis not present

## 2024-09-24 DIAGNOSIS — R413 Other amnesia: Secondary | ICD-10-CM | POA: Diagnosis not present

## 2024-09-24 DIAGNOSIS — J449 Chronic obstructive pulmonary disease, unspecified: Secondary | ICD-10-CM | POA: Diagnosis not present

## 2024-10-25 ENCOUNTER — Ambulatory Visit (INDEPENDENT_AMBULATORY_CARE_PROVIDER_SITE_OTHER)

## 2024-10-25 ENCOUNTER — Ambulatory Visit: Payer: Self-pay | Admitting: Urgent Care

## 2024-10-25 ENCOUNTER — Ambulatory Visit
Admission: RE | Admit: 2024-10-25 | Discharge: 2024-10-25 | Disposition: A | Discharge status: Home / Self Care | Source: Ambulatory Visit | Attending: Family Medicine | Admitting: Family Medicine

## 2024-10-25 VITALS — BP 120/67 | HR 57 | Temp 97.6°F | Resp 20

## 2024-10-25 DIAGNOSIS — R918 Other nonspecific abnormal finding of lung field: Secondary | ICD-10-CM | POA: Diagnosis not present

## 2024-10-25 DIAGNOSIS — K449 Diaphragmatic hernia without obstruction or gangrene: Secondary | ICD-10-CM | POA: Diagnosis not present

## 2024-10-25 DIAGNOSIS — J988 Other specified respiratory disorders: Secondary | ICD-10-CM | POA: Diagnosis not present

## 2024-10-25 DIAGNOSIS — R058 Other specified cough: Secondary | ICD-10-CM

## 2024-10-25 DIAGNOSIS — J189 Pneumonia, unspecified organism: Secondary | ICD-10-CM

## 2024-10-25 MED ORDER — BENZONATATE 100 MG PO CAPS: 100.0000 mg | ORAL_CAPSULE | Freq: Three times a day (TID) | ORAL | 0 refills | Status: DC | PRN

## 2024-10-25 MED ORDER — AMOXICILLIN 500 MG PO CAPS: 1000.0000 mg | ORAL_CAPSULE | Freq: Two times a day (BID) | ORAL | 0 refills | Status: AC

## 2024-10-25 MED ORDER — AZITHROMYCIN 250 MG PO TABS: ORAL_TABLET | ORAL | 0 refills | Status: DC

## 2024-10-25 NOTE — ED Provider Notes (Addendum)
 [facility]  Note:  This document was prepared using Conservation officer, historic buildings and may include unintentional dictation errors.  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  Patient's wife gave some over-the-counter medication and reports that he had some improvement.  The primary concern is that he may have pneumonia.  Has a history of this as well as COPD.  Patient denies fever, chest pain, shortness of breath or wheezing.  No sinus congestion.  No current facility-administered medications for this encounter.  Current Outpatient Medications:    donepezil (ARICEPT) 5 MG tablet, 1 tablet at bedtime Orally Once a day; Duration: 30 days, Disp: , Rfl:    acetaminophen  (TYLENOL ) 500 MG tablet, Take 500 mg by mouth every 6 (six) hours as needed for mild pain, moderate pain or fever., Disp: , Rfl:    amLODipine  (NORVASC ) 10 MG tablet, Take 10 mg by mouth daily., Disp: , Rfl:    amoxicillin -clavulanate (AUGMENTIN ) 875-125 MG tablet, Take 1 tablet by mouth every 12 (twelve) hours., Disp: 14 tablet, Rfl: 0   cholecalciferol (VITAMIN D3) 25 MCG (1000 UNIT) tablet, Take 1,000 Units by mouth daily., Disp: , Rfl:    ciclopirox (LOPROX) 0.77 % cream, Apply 1 application topically daily., Disp: , Rfl:    dorzolamide -timolol  (COSOPT ) 22.3-6.8 MG/ML ophthalmic solution, Place 1 drop into the left eye 2 (two) times daily., Disp: , Rfl:    ELIQUIS  5 MG TABS tablet, TAKE 1 TABLET(5 MG) BY MOUTH TWICE DAILY, Disp: 60 tablet, Rfl: 5   esomeprazole  (NEXIUM ) 20 MG capsule, Take 20 mg by mouth daily., Disp: , Rfl:    fexofenadine  (ALLEGRA  ALLERGY) 180 MG tablet, Take 1 tablet (180 mg total) by mouth daily for 15 days., Disp: 15 tablet, Rfl: 0   ketotifen  (ZADITOR ) 0.025 % ophthalmic solution, Place 1 drop into both eyes daily., Disp: , Rfl:    latanoprost  (XALATAN ) 0.005 % ophthalmic  solution, Place 1 drop into both eyes at bedtime., Disp: , Rfl:    melatonin (MELATONIN MAXIMUM STRENGTH) 5 MG TABS, Take 1 tablet (5 mg total) by mouth every evening., Disp: 30 tablet, Rfl: 0   MELATONIN PO, Take 1 tablet by mouth at bedtime., Disp: , Rfl:    Polyethyl Glycol-Propyl Glycol (SYSTANE OP), Place 1 drop into both eyes daily as needed (dry eyes)., Disp: , Rfl:    terazosin  (HYTRIN ) 10 MG capsule, Take 10 mg by mouth at bedtime. , Disp: , Rfl:    triamcinolone cream (KENALOG) 0.1 %, Apply 1 application topically daily as needed (dry and itching skin). , Disp: , Rfl:    Allergies  Allergen Reactions   Aspirin     GI Bleeding Other reaction(s): upset stomach   Bee Venom     Other reaction(s): Unknown   Tamsulosin     Other reaction(s): increased frequency   Nifedipine Other (See Comments)    Headaches  Other reaction(s): headaches    Past Medical History:  Diagnosis Date   Acute, but ill-defined, cerebrovascular disease 04/07/2014   Adenomatous colon polyp    Allergy    Atrial fibrillation (HCC)    BPH (benign prostatic hyperplasia)    Cataract    removed both eyes    COPD (chronic obstructive pulmonary disease) (HCC)    Eczema    Erectile dysfunction    Gallstones    GERD (gastroesophageal reflux disease)  Heart murmur    HOH (hard of hearing)    HTN (hypertension)    Meningioma (HCC)    Right frontal   Meralgia paraesthetica    Palpitations    Urinary retention      Past Surgical History:  Procedure Laterality Date   BLADDER DIVERTICULECTOMY  02/15/2003   thelbert 05/16/2011   CARDIAC CATHETERIZATION     CARDIOVERSION N/A 06/17/2020   Procedure: CARDIOVERSION;  Surgeon: Delford [name], MD;  Location: [facility] ENDOSCOPY;  Service: Cardiovascular;  Laterality: N/A;   CATARACT EXTRACTION Bilateral 2013   CHOLECYSTECTOMY N/A 11/19/2014   Procedure: LAPAROSCOPIC CHOLECYSTECTOMY WITH INTRAOPERATIVE CHOLANGIOGRAM;  Surgeon: Krystal Russell, MD;  Location: [facility] OR;   Service: General;  Laterality: N/A;   COLONOSCOPY     COLONOSCOPY W/ POLYPECTOMY     GUM SURGERY  1999   INGUINAL HERNIA REPAIR Left    INGUINAL HERNIA REPAIR Right 11/2009   LAPAROSCOPIC CHOLECYSTECTOMY  11/19/2014   w/IOC   MEATOTOMY  06/2003   Distal urethral meatotomy with YAG laser/notes 05/16/2011   POLYPECTOMY     SUPRAPUBIC PROSTATECTOMY  02/15/2003   thelbert 05/16/2011    Family History  Problem Relation Age of Onset   Alzheimer's disease Mother    Heart attack Father    Cancer - Lung Sister    Colon cancer Neg Hx    Colon polyps Neg Hx    Esophageal cancer Neg Hx    Rectal cancer Neg Hx    Stomach cancer Neg Hx     Social History   Tobacco Use   Smoking status: Former    Current packs/day: 0.00    Average packs/day: 0.2 packs/day for 40.0 years (8.0 ttl pk-yrs)    Types: Cigarettes    Start date: 11/12/1959    Quit date: 11/12/1999    Years since quitting: 24.9   Smokeless tobacco: Never  Vaping Use   Vaping status: Never Used  Substance Use Topics   Alcohol use: Not Currently    Comment: occ   Drug use: No    ROS   Objective:   Vitals: BP 120/67 (BP Location: Left Arm)   Pulse (!) 57   Temp 97.6 F (36.4 C) (Oral)   Resp 20   SpO2 98%   Physical Exam Constitutional:      General: He is not in acute distress.    Appearance: Normal appearance. He is well-developed and normal weight. He is not ill-appearing, toxic-appearing or diaphoretic.  HENT:     Head: Normocephalic and atraumatic.     Right Ear: External ear normal.     Left Ear: External ear normal.     Nose: Nose normal.     Mouth/Throat:     Mouth: Mucous membranes are moist.     Pharynx: No pharyngeal swelling, oropharyngeal exudate, posterior oropharyngeal erythema or uvula swelling.     Tonsils: No tonsillar exudate or tonsillar abscesses. 0 on the right. 0 on the left.  Eyes:     General: No scleral icterus.       Right eye: No discharge.        Left eye: No discharge.      Extraocular Movements: Extraocular movements intact.  Cardiovascular:     Rate and Rhythm: Normal rate and regular rhythm.     Heart sounds: Normal heart sounds. No murmur heard.    No friction rub. No gallop.  Pulmonary:     Effort: Pulmonary effort is normal. No respiratory distress.  Breath sounds: Normal breath sounds. No stridor. No wheezing, rhonchi or rales.  Musculoskeletal:     Cervical back: Normal range of motion.  Neurological:     Mental Status: He is alert and oriented to person, place, and time.  Psychiatric:        Mood and Affect: Mood normal.        Behavior: Behavior normal.        Thought Content: Thought content normal.        Judgment: Judgment normal.     Assessment and Plan :   PDMP not reviewed this encounter.  1. Respiratory infection   2. Productive cough    Suspect common viral respiratory infection and therefore recommended supportive care.  X-ray over-read was pending at time of discharge, recommended follow up with only abnormal results. Otherwise will not call for negative over-read. Patient was in agreement. Counseled patient on potential for adverse effects with medications prescribed/recommended today, ER and return-to-clinic precautions discussed, patient verbalized understanding.    Christopher Savannah, PA-C 10/25/24 1356  UPDATE: DG Chest 2 View Result Date: 10/25/2024 CLINICAL DATA:  Productive cough. EXAM: CHEST - 2 VIEW COMPARISON:  04/25/2024 FINDINGS: The cardiomediastinal contours are stable. Retrocardiac hiatal hernia. The lungs are hyperinflated, chronic. Mild patchy opacity in the left lung base. Pulmonary vasculature is normal. No pleural effusion or pneumothorax. No acute osseous abnormalities are seen. IMPRESSION: 1. Mild patchy opacity in the left lung base, atelectasis versus pneumonia. Recommend radiographic follow-up after course of treatment to ensure resolution. 2. Chronic hyperinflation. 3. Hiatal hernia. Electronically Signed    By: Andrea Gasman M.D.   On: 10/25/2024 14:15    1. Community acquired pneumonia of left lower lobe of lung   2. Productive cough   3. Respiratory infection     Patient found to have mild community-acquired pneumonia of the left lower lobe.  This is new in comparison to chest x-ray from 04/25/2024.  I used his creatinine level from March 2025.  The estimation was 91mL/min-35mL/min.  Therefore I recommended lowering the standard dose of amoxicillin  as recommended for his kidney function to 1 g twice daily for 10 days.  Recommended azithromycin  as well for double coverage.  Maintain strict ER precautions.  Patient's wife did endorse that he has had some difficulty with infectious diseases and therefore emphasized need for follow-up with infectious disease clinic.  Otherwise, repeat chest x-ray in 4 to 6 weeks.   Christopher Savannah, [place] 10/25/24 1553

## 2024-10-25 NOTE — ED Triage Notes (Signed)
 Pt c/o wet constant cough started last night-took PTC cough med that relieved sx-denies fever-NAD-steady gait with own cane

## 2024-10-25 NOTE — Discharge Instructions (Addendum)
 We will manage this as a viral respiratory infection. For sore throat or cough try using a honey-based tea. Use 3 teaspoons of honey with juice squeezed from half lemon. Place shaved pieces of ginger into 1/2-1 cup of water and warm over stove top. Then mix the ingredients and repeat every 4 hours as needed. Please take Tylenol  500mg -650mg  once every 6 hours for fevers, aches and pains. Hydrate very well with at least 2 liters (64 ounces) of water. Eat light meals such as soups (chicken and noodles, chicken wild rice, vegetable).  Do not eat any foods that you are allergic to.  Start an antihistamine like Zyrtec (5mg  daily) for postnasal drainage, sinus congestion.  You can take this together with cough capsules (benzonatate ) 3 times a day or twice daily as needed for the cough.

## 2024-10-26 ENCOUNTER — Telehealth: Payer: Self-pay

## 2024-10-26 NOTE — Telephone Encounter (Addendum)
 Patient partner and POA Rhoda left a voicemail on triage nurse line -- asking if patient can take Amoxicillin  and Azithromcin dx at [facility] yesterday with ppneumonia. Patient is resistant to certain abx's and wanted to know if it is okay for him to take medications.   Callback  [phone]

## 2024-10-26 NOTE — Telephone Encounter (Signed)
 Informed patient partner [name] - the antibiotics shouldn't be an issue.   Clariza Sickman SHAUNNA Letters, CMA

## 2024-11-23 DIAGNOSIS — F03A0 Unspecified dementia, mild, without behavioral disturbance, psychotic disturbance, mood disturbance, and anxiety: Secondary | ICD-10-CM | POA: Diagnosis not present

## 2024-11-24 ENCOUNTER — Other Ambulatory Visit: Payer: Self-pay | Admitting: Internal Medicine

## 2024-11-24 DIAGNOSIS — F03A0 Unspecified dementia, mild, without behavioral disturbance, psychotic disturbance, mood disturbance, and anxiety: Secondary | ICD-10-CM

## 2024-11-30 DIAGNOSIS — J159 Unspecified bacterial pneumonia: Secondary | ICD-10-CM | POA: Diagnosis not present

## 2024-12-01 DIAGNOSIS — J159 Unspecified bacterial pneumonia: Secondary | ICD-10-CM | POA: Diagnosis not present

## 2024-12-02 DIAGNOSIS — H57813 Brow ptosis, bilateral: Secondary | ICD-10-CM | POA: Diagnosis not present

## 2024-12-02 DIAGNOSIS — H04123 Dry eye syndrome of bilateral lacrimal glands: Secondary | ICD-10-CM | POA: Diagnosis not present

## 2024-12-02 DIAGNOSIS — Z961 Presence of intraocular lens: Secondary | ICD-10-CM | POA: Diagnosis not present

## 2024-12-02 DIAGNOSIS — H43813 Vitreous degeneration, bilateral: Secondary | ICD-10-CM | POA: Diagnosis not present

## 2024-12-02 DIAGNOSIS — H4720 Unspecified optic atrophy: Secondary | ICD-10-CM | POA: Diagnosis not present

## 2024-12-02 DIAGNOSIS — H401111 Primary open-angle glaucoma, right eye, mild stage: Secondary | ICD-10-CM | POA: Diagnosis not present

## 2024-12-02 DIAGNOSIS — H401122 Primary open-angle glaucoma, left eye, moderate stage: Secondary | ICD-10-CM | POA: Diagnosis not present

## 2024-12-02 DIAGNOSIS — H1045 Other chronic allergic conjunctivitis: Secondary | ICD-10-CM | POA: Diagnosis not present

## 2024-12-02 NOTE — Progress Notes (Signed)
 Date:  12/10/2024   Jared Mannan, PhD Date of Birth: [birth year] Medical Record #[id number]  PCP:  Jared Nottingham, MD  Cardiologist:  Jared Neal Emmer  History of Present Illness:  89 y.o. with history of HTN, COPD, GERD. Chronically abnormal ECG Did not tolerated Procardia for his BP.  Seen by NP 01/11/20 found to be in afib Started on eliquis  and had DCC 06/17/20. Tends to be bradycardic in sinus And is not Rx with AV nodal drugs currently At some point reverted back to asymptomatic afib and rate control adopted   Held his DOAC for colonoscopy and EGD with Dr Jared 10/10/20 and had multiple polyps removed    Walks everyday at [employer] Has a little Yorkie named Lilly that's good Company   03/05/22 Hospitalized with COPD/Pneumonia Rx with Omnicef  and Azithromycin    Seems to regret being as old as he is but still active Hct 43.4  PLT 135  normal Cr 1.2 11/26/22   BP better on higher dose Norvasc    He is retired warden/ranger Was born in [place] , grew up in [place] and went to school in [place] His daughter just moved back to [place] from there   Had COVID 10/06/23 had antiviral Rx persistent cough seen in ED 11/15/23  Rx with benzonatate  CXR NAD hyperinflation Rx for URI cough ? Pneumonia on CXR 10/25/24 with Zithromax .   Discussed stopping aricept if pulse < 50 Weight down poor appetite Discussed ensure supplements If weight < 132 lbs need to cut eliquis  to 2.5 mg bid. They have a scale BP down with weight loss. Taper Norvasc  to off     Past Medical History:  Diagnosis Date   Acute, but ill-defined, cerebrovascular disease 04/07/2014   Adenomatous colon polyp    Allergy    Atrial fibrillation (HCC)    BPH (benign prostatic hyperplasia)    Cataract    removed both eyes    COPD (chronic obstructive pulmonary disease) (HCC)    Eczema    Erectile dysfunction    Gallstones    GERD (gastroesophageal reflux disease)    Heart murmur    HOH (hard of hearing)    HTN  (hypertension)    Meningioma (HCC)    Right frontal   Meralgia paraesthetica    Palpitations    Urinary retention     Past Surgical History:  Procedure Laterality Date   BLADDER DIVERTICULECTOMY  02/15/2003   Neal 05/16/2011   CARDIAC CATHETERIZATION     CARDIOVERSION N/A 06/17/2020   Procedure: CARDIOVERSION;  Surgeon: Emmer [name], MD;  Location: [facility] ENDOSCOPY;  Service: Cardiovascular;  Laterality: N/A;   CATARACT EXTRACTION Bilateral 2013   CHOLECYSTECTOMY N/A 11/19/2014   Procedure: LAPAROSCOPIC CHOLECYSTECTOMY WITH INTRAOPERATIVE CHOLANGIOGRAM;  Surgeon: Krystal Russell, MD;  Location: [facility] OR;  Service: General;  Laterality: N/A;   COLONOSCOPY     COLONOSCOPY W/ POLYPECTOMY     GUM SURGERY  1999   INGUINAL HERNIA REPAIR Left    INGUINAL HERNIA REPAIR Right 11/2009   LAPAROSCOPIC CHOLECYSTECTOMY  11/19/2014   w/IOC   MEATOTOMY  06/2003   Distal urethral meatotomy with YAG laser/notes 05/16/2011   POLYPECTOMY     SUPRAPUBIC PROSTATECTOMY  02/15/2003   Neal 05/16/2011     Medications: Current Meds  Medication Sig   acetaminophen  (TYLENOL ) 500 MG tablet Take 500 mg by mouth every 6 (six) hours as needed for mild pain, moderate pain or fever.   amLODipine  (NORVASC ) 10 MG tablet Take 10 mg  by mouth daily.   amoxicillin -clavulanate (AUGMENTIN ) 875-125 MG tablet Take 1 tablet by mouth every 12 (twelve) hours.   azithromycin  (ZITHROMAX ) 250 MG tablet Day 1: take 2 tablets. Day 2-5: Take 1 tablet daily.   benzonatate  (TESSALON ) 100 MG capsule Take 1 capsule (100 mg total) by mouth 3 (three) times daily as needed for cough.   cholecalciferol (VITAMIN D3) 25 MCG (1000 UNIT) tablet Take 1,000 Units by mouth daily.   ciclopirox (LOPROX) 0.77 % cream Apply 1 application topically daily.   donepezil (ARICEPT) 5 MG tablet 1 tablet at bedtime Orally Once a day; Duration: 30 days   dorzolamide -timolol  (COSOPT ) 22.3-6.8 MG/ML ophthalmic solution Place 1 drop into the left eye 2 (two)  times daily.   ELIQUIS  5 MG TABS tablet TAKE 1 TABLET(5 MG) BY MOUTH TWICE DAILY   esomeprazole  (NEXIUM ) 20 MG capsule Take 20 mg by mouth daily.   fexofenadine  (ALLEGRA  ALLERGY) 180 MG tablet Take 1 tablet (180 mg total) by mouth daily for 15 days.   ketotifen  (ZADITOR ) 0.025 % ophthalmic solution Place 1 drop into both eyes daily.   latanoprost  (XALATAN ) 0.005 % ophthalmic solution Place 1 drop into both eyes at bedtime.   melatonin (MELATONIN MAXIMUM STRENGTH) 5 MG TABS Take 1 tablet (5 mg total) by mouth every evening.   MELATONIN PO Take 1 tablet by mouth at bedtime.   Polyethyl Glycol-Propyl Glycol (SYSTANE OP) Place 1 drop into both eyes daily as needed (dry eyes).   terazosin  (HYTRIN ) 10 MG capsule Take 10 mg by mouth at bedtime.    triamcinolone  cream (KENALOG) 0.1 % Apply 1 application topically daily as needed (dry and itching skin).      Allergies: Allergies  Allergen Reactions   Aspirin     GI Bleeding Other reaction(s): upset stomach   Bee Venom     Other reaction(s): Unknown   Tamsulosin     Other reaction(s): increased frequency   Nifedipine Other (See Comments)    Headaches  Other reaction(s): headaches    Social History: The patient  reports that he quit smoking about 25 years ago. His smoking use included cigarettes. He started smoking about 65 years ago. He has a 8 pack-year smoking history. He has never used smokeless tobacco. He reports that he does not currently use alcohol. He reports that he does not use drugs.   Family History: The patient's family history includes Alzheimer's disease in his mother; Cancer - Lung in his sister; Heart attack in his father.   Review of Systems: Please see the history of present illness.   All other systems are reviewed and negative.   Physical Exam: VS:  BP (!) 106/50   Pulse (!) 56   Ht 5' 11 (1.803 m)   Wt 146 lb (66.2 kg)   SpO2 95%   BMI 20.36 kg/m  .  BMI Body mass index is 20.36 kg/m.  Wt Readings from  Last 3 Encounters:  12/10/24 146 lb (66.2 kg)  06/08/24 141 lb (64 kg)  12/10/23 148 lb 3.2 oz (67.2 kg)   Affect appropriate Healthy:  appears stated age HEENT: normal Neck supple with no adenopathy JVP normal no bruits no thyromegaly Lungs clear with no wheezing and good diaphragmatic motion Heart:  S1/S2 no murmur, no rub, gallop or click PMI normal Abdomen: benighn, BS positve, no tenderness, no AAA no bruit.  No HSM or HJR Distal pulses intact with no bruits No edema Neuro non-focal Skin warm and dry No muscular weakness  LABORATORY DATA:  EKG:  12/10/2024   afib rate 54 chronic lateral T wave changes PVC   Lab Results  Component Value Date   WBC 9.2 03/05/2022   HGB 12.4 (L) 03/05/2022   HCT 38.4 (L) 03/05/2022   PLT 120 (L) 03/05/2022   GLUCOSE 125 (H) 03/05/2022   ALT 11 06/28/2020   AST 11 06/28/2020   NA 136 03/05/2022   K 3.7 03/05/2022   CL 104 03/05/2022   CREATININE 1.07 03/05/2022   BUN 34 (H) 03/05/2022   CO2 22 03/05/2022   TSH 2.270 01/11/2020   INR 1.14 11/11/2014    Other Studies Reviewed Today:  Echo 08/18/20    1. Left ventricular ejection fraction, by estimation, is 65 to 70%. The  left ventricle has normal function. The left ventricle has no regional  wall motion abnormalities. There is moderate variable left ventricular  hypertrophy of the basal-septal  segment. Left ventricular diastolic parameters are consistent with  age-related delayed relaxation (normal).   2. Right ventricular systolic function is normal. The right ventricular  size is normal. Tricuspid regurgitation signal is inadequate for assessing  PA pressure.   3. Left atrial size was mildly dilated.   4. Right atrial size was mildly dilated.   5. The mitral valve is normal in structure. Mild mitral valve  regurgitation. No evidence of mitral stenosis.   6. The aortic valve is tricuspid. Aortic valve regurgitation is not  visualized. Mild aortic valve sclerosis is  present, with no evidence of  aortic valve stenosis.   7. Aortic dilatation noted. There is borderline dilatation of the  ascending aorta measuring 38 mm.   8. The inferior vena cava is normal in size with greater than 50%  respiratory variability, suggesting right atrial pressure of 3 mmHg.      Assessment/Plan:  1. PAF -  DCC done 06/17/20 Asymptomatic reversion to afib Rate control and anticoagulation strategy adopted  On Eliquis  with no bleeding issues   Weight > 60 kg and Cr normal 03/05/22 Echo 08/18/20 with moderate LVH EF 65-70% mild biatrial enlargement and only mild MR   2. HTN - Well controlled.  Wean and d/c norvasc    3. Chronically abnormal EKG with lateral T wave changes - active with no angina observe  Given age No indication for stress testing   5. GI:  F/u pathology with Dr Jared regarding polyps no bleeding issues  Normal bowel habits   6. COVID:  October 2024 Rx antiviral and Benzonatate  for cough CXR NAD improved    Current medicines are reviewed with the patient today.  The patient does not have concerns regarding medicines other than what has been noted above.  The following changes have been made: D/C Norvasc    Labs/ tests ordered today include: None   Disposition:   FU in 6 months   Signed: Maude Emmer, MD  12/10/2024 8:50 AM  [facility] [address] [place], [place]  [address] Phone: [phone] Fax: [phone]

## 2024-12-10 ENCOUNTER — Encounter: Payer: Self-pay | Admitting: Cardiovascular Disease

## 2024-12-10 ENCOUNTER — Ambulatory Visit: Attending: Internal Medicine | Admitting: Cardiovascular Disease

## 2024-12-10 VITALS — BP 106/50 | HR 56 | Ht 71.0 in | Wt 146.0 lb

## 2024-12-10 DIAGNOSIS — I480 Paroxysmal atrial fibrillation: Secondary | ICD-10-CM | POA: Diagnosis present

## 2024-12-10 DIAGNOSIS — I10 Essential (primary) hypertension: Secondary | ICD-10-CM | POA: Diagnosis present

## 2024-12-10 DIAGNOSIS — Z7901 Long term (current) use of anticoagulants: Secondary | ICD-10-CM | POA: Diagnosis present

## 2024-12-10 DIAGNOSIS — Z5181 Encounter for therapeutic drug level monitoring: Secondary | ICD-10-CM | POA: Diagnosis present

## 2024-12-10 NOTE — Patient Instructions (Signed)
 Medication Instructions:  Your physician recommends that you continue on your current medications as directed. Please refer to the Current Medication list given to you today.  *If you need a refill on your cardiac medications before your next appointment, please call your pharmacy*  Lab Work: NONE If you have labs (blood work) drawn today and your tests are completely normal, you will receive your results only by: MyChart Message (if you have MyChart) OR A paper copy in the mail If you have any lab test that is abnormal or we need to change your treatment, we will call you to review the results.  Testing/Procedures: NONE  Follow-Up: At [facility], you and your health needs are our priority.  As part of our continuing mission to provide you with exceptional heart care, our providers are all part of one team.  This team includes your primary Cardiologist (physician) and Advanced Practice Providers or APPs (Physician Assistants and Nurse Practitioners) who all work together to provide you with the care you need, when you need it.  Your next appointment:   6 month(s)  Provider:   Janelle Mediate, MD   We recommend signing up for the patient portal called "MyChart".  Sign up information is provided on this After Visit Summary.  MyChart is used to connect with patients for Virtual Visits (Telemedicine).  Patients are able to view lab/test results, encounter notes, upcoming appointments, etc.  Non-urgent messages can be sent to your provider as well.   To learn more about what you can do with MyChart, go to [email or web address].

## 2024-12-13 ENCOUNTER — Ambulatory Visit
Admission: RE | Admit: 2024-12-13 | Discharge: 2024-12-13 | Disposition: A | Discharge status: Home / Self Care | Source: Ambulatory Visit | Attending: Internal Medicine | Admitting: Internal Medicine

## 2024-12-13 DIAGNOSIS — F03A0 Unspecified dementia, mild, without behavioral disturbance, psychotic disturbance, mood disturbance, and anxiety: Secondary | ICD-10-CM

## 2024-12-15 ENCOUNTER — Telehealth: Payer: Self-pay | Admitting: Cardiovascular Disease

## 2024-12-15 NOTE — Telephone Encounter (Signed)
 Per Dr. Okey via messaging: There was discussion to stop Aricept  as this can cause bradycardia I would stop it and follow HR and BP They were tapering amlodipine  as well. His BP was OK at 110s HR a little low I would keep following BP Stay hydrated  Spoke with Dr. Okey (DOD). Advised to discontinue Aricept  at this time. Also encourage fluids (milkshake, Ensure, Gatorade, Pedialyte) and monitor BP and HR. Educated Roselle with these instructions and verbalized understanding.   Advised significant other if patient became symptomatic (lightheaded, dizziness, increased bradycardia) to seek emergency assistance or call 911. Also verbalized understanding.

## 2024-12-15 NOTE — Telephone Encounter (Signed)
 STAT if HR is under 50 or over 120 (normal HR is 60-100 beats per minute)  What is your heart rate? 39  Do you have a log of your heart rate readings (document readings)? 46, 52, 45 (last 6 Day of readings)  Do you have any other symptoms? Fatigue (Been in bed for the last two days, weak

## 2024-12-15 NOTE — Telephone Encounter (Signed)
 Triage Call:  Pt significant other calling in regards to patient being bradycardic. States patient is weak and unsteady.  Vitals this morning at 1100: HR 39, BP 110/64;  She also stated pt is weaker than this morning than baseline.   Jared Neal says Per Dr. Delford at last visit: Norvasc  being tapered off. Last dose Thursday. Possible d/c Eliquis  at last visit, but no changes at that time.  On Aricept  for dementia, no appetite. Eating very little. Aricept  known to decrease HR also.    Current VS 113/70, HR 42. Cup of coffee, herbal tea and a glass of water today.   Will send to DOD for further recommendations. Myles Jared Neal if patient worsens before call back to call 911 or go to the Emergency Room.   Verbalized understanding.

## 2024-12-16 ENCOUNTER — Other Ambulatory Visit: Payer: Self-pay

## 2024-12-16 ENCOUNTER — Inpatient Hospital Stay (HOSPITAL_COMMUNITY)
Admission: EM | Admit: 2024-12-16 | Discharge: 2024-12-31 | DRG: 260 | Disposition: E | Attending: Internal Medicine | Admitting: Internal Medicine

## 2024-12-16 ENCOUNTER — Emergency Department (HOSPITAL_COMMUNITY)

## 2024-12-16 ENCOUNTER — Inpatient Hospital Stay (HOSPITAL_COMMUNITY)

## 2024-12-16 ENCOUNTER — Encounter (HOSPITAL_COMMUNITY): Payer: Self-pay

## 2024-12-16 DIAGNOSIS — R9431 Abnormal electrocardiogram [ECG] [EKG]: Secondary | ICD-10-CM | POA: Insufficient documentation

## 2024-12-16 DIAGNOSIS — F03A0 Unspecified dementia, mild, without behavioral disturbance, psychotic disturbance, mood disturbance, and anxiety: Secondary | ICD-10-CM | POA: Diagnosis present

## 2024-12-16 DIAGNOSIS — R627 Adult failure to thrive: Secondary | ICD-10-CM

## 2024-12-16 DIAGNOSIS — Z9049 Acquired absence of other specified parts of digestive tract: Secondary | ICD-10-CM

## 2024-12-16 DIAGNOSIS — Z8249 Family history of ischemic heart disease and other diseases of the circulatory system: Secondary | ICD-10-CM

## 2024-12-16 DIAGNOSIS — Z950 Presence of cardiac pacemaker: Secondary | ICD-10-CM

## 2024-12-16 DIAGNOSIS — R7989 Other specified abnormal findings of blood chemistry: Secondary | ICD-10-CM | POA: Diagnosis not present

## 2024-12-16 DIAGNOSIS — Z860101 Personal history of adenomatous and serrated colon polyps: Secondary | ICD-10-CM

## 2024-12-16 DIAGNOSIS — G458 Other transient cerebral ischemic attacks and related syndromes: Secondary | ICD-10-CM | POA: Insufficient documentation

## 2024-12-16 DIAGNOSIS — Z6820 Body mass index (BMI) 20.0-20.9, adult: Secondary | ICD-10-CM

## 2024-12-16 DIAGNOSIS — I700 Atherosclerosis of aorta: Secondary | ICD-10-CM | POA: Diagnosis present

## 2024-12-16 DIAGNOSIS — J449 Chronic obstructive pulmonary disease, unspecified: Secondary | ICD-10-CM | POA: Diagnosis present

## 2024-12-16 DIAGNOSIS — R001 Bradycardia, unspecified: Secondary | ICD-10-CM

## 2024-12-16 DIAGNOSIS — I10 Essential (primary) hypertension: Secondary | ICD-10-CM | POA: Diagnosis present

## 2024-12-16 DIAGNOSIS — T45515A Adverse effect of anticoagulants, initial encounter: Secondary | ICD-10-CM | POA: Diagnosis present

## 2024-12-16 DIAGNOSIS — I110 Hypertensive heart disease with heart failure: Secondary | ICD-10-CM | POA: Diagnosis present

## 2024-12-16 DIAGNOSIS — N401 Enlarged prostate with lower urinary tract symptoms: Secondary | ICD-10-CM | POA: Diagnosis present

## 2024-12-16 DIAGNOSIS — Z8673 Personal history of transient ischemic attack (TIA), and cerebral infarction without residual deficits: Secondary | ICD-10-CM

## 2024-12-16 DIAGNOSIS — Z8701 Personal history of pneumonia (recurrent): Secondary | ICD-10-CM

## 2024-12-16 DIAGNOSIS — Z66 Do not resuscitate: Secondary | ICD-10-CM | POA: Diagnosis not present

## 2024-12-16 DIAGNOSIS — Z515 Encounter for palliative care: Secondary | ICD-10-CM

## 2024-12-16 DIAGNOSIS — D6832 Hemorrhagic disorder due to extrinsic circulating anticoagulants: Secondary | ICD-10-CM | POA: Diagnosis present

## 2024-12-16 DIAGNOSIS — Z7189 Other specified counseling: Secondary | ICD-10-CM | POA: Diagnosis not present

## 2024-12-16 DIAGNOSIS — R2681 Unsteadiness on feet: Secondary | ICD-10-CM | POA: Diagnosis present

## 2024-12-16 DIAGNOSIS — Y846 Urinary catheterization as the cause of abnormal reaction of the patient, or of later complication, without mention of misadventure at the time of the procedure: Secondary | ICD-10-CM | POA: Diagnosis present

## 2024-12-16 DIAGNOSIS — J9811 Atelectasis: Secondary | ICD-10-CM | POA: Diagnosis present

## 2024-12-16 DIAGNOSIS — I3139 Other pericardial effusion (noninflammatory): Secondary | ICD-10-CM | POA: Diagnosis present

## 2024-12-16 DIAGNOSIS — K862 Cyst of pancreas: Secondary | ICD-10-CM | POA: Diagnosis present

## 2024-12-16 DIAGNOSIS — I480 Paroxysmal atrial fibrillation: Secondary | ICD-10-CM | POA: Diagnosis not present

## 2024-12-16 DIAGNOSIS — H9190 Unspecified hearing loss, unspecified ear: Secondary | ICD-10-CM | POA: Diagnosis present

## 2024-12-16 DIAGNOSIS — I442 Atrioventricular block, complete: Secondary | ICD-10-CM | POA: Diagnosis present

## 2024-12-16 DIAGNOSIS — R531 Weakness: Principal | ICD-10-CM

## 2024-12-16 DIAGNOSIS — N139 Obstructive and reflux uropathy, unspecified: Secondary | ICD-10-CM | POA: Diagnosis present

## 2024-12-16 DIAGNOSIS — I495 Sick sinus syndrome: Secondary | ICD-10-CM | POA: Diagnosis present

## 2024-12-16 DIAGNOSIS — Z9079 Acquired absence of other genital organ(s): Secondary | ICD-10-CM

## 2024-12-16 DIAGNOSIS — T8384XA Pain from genitourinary prosthetic devices, implants and grafts, initial encounter: Secondary | ICD-10-CM | POA: Diagnosis present

## 2024-12-16 DIAGNOSIS — Z9841 Cataract extraction status, right eye: Secondary | ICD-10-CM

## 2024-12-16 DIAGNOSIS — R0603 Acute respiratory distress: Secondary | ICD-10-CM | POA: Diagnosis not present

## 2024-12-16 DIAGNOSIS — Z820 Family history of epilepsy and other diseases of the nervous system: Secondary | ICD-10-CM

## 2024-12-16 DIAGNOSIS — Z7901 Long term (current) use of anticoagulants: Secondary | ICD-10-CM | POA: Diagnosis not present

## 2024-12-16 DIAGNOSIS — E872 Acidosis, unspecified: Secondary | ICD-10-CM | POA: Diagnosis present

## 2024-12-16 DIAGNOSIS — Z9842 Cataract extraction status, left eye: Secondary | ICD-10-CM

## 2024-12-16 DIAGNOSIS — J90 Pleural effusion, not elsewhere classified: Secondary | ICD-10-CM | POA: Diagnosis not present

## 2024-12-16 DIAGNOSIS — R338 Other retention of urine: Secondary | ICD-10-CM | POA: Diagnosis not present

## 2024-12-16 DIAGNOSIS — K2980 Duodenitis without bleeding: Secondary | ICD-10-CM | POA: Diagnosis present

## 2024-12-16 DIAGNOSIS — I2510 Atherosclerotic heart disease of native coronary artery without angina pectoris: Secondary | ICD-10-CM | POA: Diagnosis present

## 2024-12-16 DIAGNOSIS — K279 Peptic ulcer, site unspecified, unspecified as acute or chronic, without hemorrhage or perforation: Secondary | ICD-10-CM | POA: Diagnosis present

## 2024-12-16 DIAGNOSIS — T8383XA Hemorrhage of genitourinary prosthetic devices, implants and grafts, initial encounter: Secondary | ICD-10-CM | POA: Diagnosis present

## 2024-12-16 DIAGNOSIS — G9341 Metabolic encephalopathy: Secondary | ICD-10-CM | POA: Diagnosis present

## 2024-12-16 DIAGNOSIS — N99820 Postprocedural hemorrhage and hematoma of a genitourinary system organ or structure following a genitourinary system procedure: Secondary | ICD-10-CM | POA: Diagnosis present

## 2024-12-16 DIAGNOSIS — Z87891 Personal history of nicotine dependence: Secondary | ICD-10-CM

## 2024-12-16 DIAGNOSIS — E860 Dehydration: Secondary | ICD-10-CM

## 2024-12-16 DIAGNOSIS — Z886 Allergy status to analgesic agent status: Secondary | ICD-10-CM

## 2024-12-16 DIAGNOSIS — F03A4 Unspecified dementia, mild, with anxiety: Secondary | ICD-10-CM | POA: Diagnosis present

## 2024-12-16 DIAGNOSIS — Z91030 Bee allergy status: Secondary | ICD-10-CM

## 2024-12-16 DIAGNOSIS — A419 Sepsis, unspecified organism: Secondary | ICD-10-CM | POA: Diagnosis not present

## 2024-12-16 DIAGNOSIS — K219 Gastro-esophageal reflux disease without esophagitis: Secondary | ICD-10-CM | POA: Diagnosis present

## 2024-12-16 DIAGNOSIS — D696 Thrombocytopenia, unspecified: Secondary | ICD-10-CM | POA: Diagnosis present

## 2024-12-16 DIAGNOSIS — I77810 Thoracic aortic ectasia: Secondary | ICD-10-CM | POA: Diagnosis present

## 2024-12-16 DIAGNOSIS — R29810 Facial weakness: Secondary | ICD-10-CM | POA: Diagnosis present

## 2024-12-16 DIAGNOSIS — E43 Unspecified severe protein-calorie malnutrition: Secondary | ICD-10-CM | POA: Diagnosis present

## 2024-12-16 DIAGNOSIS — Z8619 Personal history of other infectious and parasitic diseases: Secondary | ICD-10-CM

## 2024-12-16 DIAGNOSIS — I5031 Acute diastolic (congestive) heart failure: Secondary | ICD-10-CM | POA: Diagnosis present

## 2024-12-16 DIAGNOSIS — Z22358 Carrier of other enterobacterales: Secondary | ICD-10-CM

## 2024-12-16 DIAGNOSIS — I4821 Permanent atrial fibrillation: Secondary | ICD-10-CM | POA: Diagnosis present

## 2024-12-16 DIAGNOSIS — I959 Hypotension, unspecified: Secondary | ICD-10-CM | POA: Diagnosis present

## 2024-12-16 DIAGNOSIS — R310 Gross hematuria: Secondary | ICD-10-CM | POA: Diagnosis present

## 2024-12-16 DIAGNOSIS — R680 Hypothermia, not associated with low environmental temperature: Secondary | ICD-10-CM | POA: Diagnosis present

## 2024-12-16 DIAGNOSIS — I2489 Other forms of acute ischemic heart disease: Secondary | ICD-10-CM | POA: Diagnosis present

## 2024-12-16 DIAGNOSIS — I6523 Occlusion and stenosis of bilateral carotid arteries: Secondary | ICD-10-CM | POA: Diagnosis present

## 2024-12-16 DIAGNOSIS — Z888 Allergy status to other drugs, medicaments and biological substances status: Secondary | ICD-10-CM

## 2024-12-16 DIAGNOSIS — Z79899 Other long term (current) drug therapy: Secondary | ICD-10-CM

## 2024-12-16 DIAGNOSIS — R319 Hematuria, unspecified: Secondary | ICD-10-CM | POA: Diagnosis present

## 2024-12-16 DIAGNOSIS — T68XXXA Hypothermia, initial encounter: Secondary | ICD-10-CM | POA: Diagnosis present

## 2024-12-16 DIAGNOSIS — R4781 Slurred speech: Secondary | ICD-10-CM | POA: Diagnosis present

## 2024-12-16 DIAGNOSIS — N320 Bladder-neck obstruction: Secondary | ICD-10-CM | POA: Diagnosis present

## 2024-12-16 DIAGNOSIS — I4891 Unspecified atrial fibrillation: Secondary | ICD-10-CM | POA: Diagnosis not present

## 2024-12-16 DIAGNOSIS — R2689 Other abnormalities of gait and mobility: Secondary | ICD-10-CM | POA: Insufficient documentation

## 2024-12-16 LAB — COMPREHENSIVE METABOLIC PANEL WITH GFR
ALT: 28 U/L (ref 0–44)
AST: 26 U/L (ref 15–41)
Albumin: 3.8 g/dL (ref 3.5–5.0)
Alkaline Phosphatase: 73 U/L (ref 38–126)
Anion gap: 8 (ref 5–15)
BUN: 47 mg/dL — ABNORMAL HIGH (ref 8–23)
CO2: 25 mmol/L (ref 22–32)
Calcium: 8.4 mg/dL — ABNORMAL LOW (ref 8.9–10.3)
Chloride: 105 mmol/L (ref 98–111)
Creatinine, Ser: 1.14 mg/dL (ref 0.61–1.24)
GFR, Estimated: 60 mL/min — ABNORMAL LOW (ref >60)
Glucose, Bld: 151 mg/dL — ABNORMAL HIGH (ref 70–99)
Potassium: 4.5 mmol/L (ref 3.5–5.1)
Sodium: 138 mmol/L (ref 135–145)
Total Bilirubin: 0.9 mg/dL (ref 0.0–1.2)
Total Protein: 5.2 g/dL — ABNORMAL LOW (ref 6.5–8.1)

## 2024-12-16 LAB — I-STAT CHEM 8, ED
BUN: 44 mg/dL — ABNORMAL HIGH (ref 8–23)
Calcium, Ion: 1.07 mmol/L — ABNORMAL LOW (ref 1.15–1.40)
Chloride: 105 mmol/L (ref 98–111)
Creatinine, Ser: 1.2 mg/dL (ref 0.61–1.24)
Glucose, Bld: 150 mg/dL — ABNORMAL HIGH (ref 70–99)
HCT: 30 % — ABNORMAL LOW (ref 39.0–52.0)
Hemoglobin: 10.2 g/dL — ABNORMAL LOW (ref 13.0–17.0)
Potassium: 4.3 mmol/L (ref 3.5–5.1)
Sodium: 138 mmol/L (ref 135–145)
TCO2: 22 mmol/L (ref 22–32)

## 2024-12-16 LAB — URINALYSIS, W/ REFLEX TO CULTURE (INFECTION SUSPECTED)
Bacteria, UA: NONE SEEN
RBC / HPF: >50 RBC/hpf (ref 0–5)
Squamous Epithelial / HPF: NONE SEEN /HPF (ref 0–5)

## 2024-12-16 LAB — PRO BRAIN NATRIURETIC PEPTIDE: Pro Brain Natriuretic Peptide: 2863 pg/mL — ABNORMAL HIGH (ref <300.0)

## 2024-12-16 LAB — CBC WITH DIFFERENTIAL/PLATELET
Basophils Absolute: 0 K/uL (ref 0.0–0.1)
Basophils Relative: 0 %
Eosinophils Absolute: 0 K/uL (ref 0.0–0.5)
Eosinophils Relative: 0 %
HCT: 33.3 % — ABNORMAL LOW (ref 39.0–52.0)
Hemoglobin: 10.4 g/dL — ABNORMAL LOW (ref 13.0–17.0)
Lymphocytes Relative: 79 %
Lymphs Abs: 6.7 K/uL — ABNORMAL HIGH (ref 0.7–4.0)
MCH: 30.1 pg (ref 26.0–34.0)
MCHC: 31.2 g/dL (ref 30.0–36.0)
MCV: 96.5 fL (ref 80.0–100.0)
Monocytes Absolute: 0.4 K/uL (ref 0.1–1.0)
Monocytes Relative: 5 %
Neutro Abs: 1.4 K/uL — ABNORMAL LOW (ref 1.7–7.7)
Neutrophils Relative %: 16 %
Platelets: 59 K/uL — ABNORMAL LOW (ref 150–400)
RBC: 3.45 MIL/uL — ABNORMAL LOW (ref 4.22–5.81)
RDW: 14.4 % (ref 11.5–15.5)
WBC: 8.5 K/uL (ref 4.0–10.5)
nRBC: 0 % (ref 0.0–0.2)

## 2024-12-16 LAB — I-STAT CG4 LACTIC ACID, ED
Lactic Acid, Venous: 0.4 mmol/L — ABNORMAL LOW (ref 0.5–1.9)
Lactic Acid, Venous: 0.3 mmol/L — ABNORMAL LOW (ref 0.5–1.9)

## 2024-12-16 LAB — I-STAT VENOUS BLOOD GAS, ED
Acid-base deficit: 2 mmol/L (ref 0.0–2.0)
Bicarbonate: 23.1 mmol/L (ref 20.0–28.0)
Calcium, Ion: 1.06 mmol/L — ABNORMAL LOW (ref 1.15–1.40)
HCT: 30 % — ABNORMAL LOW (ref 39.0–52.0)
Hemoglobin: 10.2 g/dL — ABNORMAL LOW (ref 13.0–17.0)
O2 Saturation: 99 %
Potassium: 4.3 mmol/L (ref 3.5–5.1)
Sodium: 137 mmol/L (ref 135–145)
TCO2: 24 mmol/L (ref 22–32)
pCO2, Ven: 40.2 mmHg — ABNORMAL LOW (ref 44–60)
pH, Ven: 7.367 (ref 7.25–7.43)
pO2, Ven: 154 mmHg — ABNORMAL HIGH (ref 32–45)

## 2024-12-16 LAB — PROTIME-INR
INR: 2.1 — ABNORMAL HIGH (ref 0.8–1.2)
Prothrombin Time: 24.6 s — ABNORMAL HIGH (ref 11.4–15.2)

## 2024-12-16 LAB — TROPONIN T, HIGH SENSITIVITY: Troponin T High Sensitivity: 28 ng/L — ABNORMAL HIGH (ref 0–19)

## 2024-12-16 MED ORDER — DONEPEZIL HCL 10 MG PO TABS: 10.0000 mg | ORAL_TABLET | Freq: Every day | ORAL | Status: DC

## 2024-12-16 MED ORDER — SODIUM CHLORIDE 0.9% FLUSH: 3.0000 mL | Freq: Two times a day (BID) | INTRAVENOUS | Status: DC

## 2024-12-16 MED ORDER — LACTATED RINGERS IV BOLUS: 500.0000 mL | Freq: Once | INTRAVENOUS | Status: DC

## 2024-12-16 MED ORDER — LACTATED RINGERS IV SOLN: INTRAVENOUS | Status: AC

## 2024-12-16 MED ORDER — DORZOLAMIDE HCL-TIMOLOL MAL 2-0.5 % OP SOLN
1.0000 [drp] | Freq: Two times a day (BID) | OPHTHALMIC | Status: DC
  Administered 2024-12-17 (×2): 1 [drp] via OPHTHALMIC
  Filled 2024-12-16 (×2): qty 10

## 2024-12-16 MED ORDER — PANTOPRAZOLE SODIUM 40 MG PO TBEC
40.0000 mg | DELAYED_RELEASE_TABLET | Freq: Every day | ORAL | Status: DC
  Administered 2024-12-18 – 2024-12-20 (×3): 40 mg via ORAL
  Filled 2024-12-16 (×2): qty 1

## 2024-12-16 MED ORDER — SODIUM CHLORIDE 0.9 % IV SOLN
1.0000 g | Freq: Two times a day (BID) | INTRAVENOUS | Status: DC
  Administered 2024-12-16 – 2024-12-17 (×4): 1 g via INTRAVENOUS
  Filled 2024-12-16 (×5): qty 20

## 2024-12-16 MED ORDER — MELATONIN 5 MG PO TABS: 5.0000 mg | ORAL_TABLET | Freq: Every evening | ORAL | Status: DC

## 2024-12-16 MED ORDER — LIDOCAINE HCL URETHRAL/MUCOSAL 2 % EX GEL
1.0000 | Freq: Once | CUTANEOUS | Status: AC
  Administered 2024-12-16: 18:00:00 1 via TOPICAL
  Filled 2024-12-16: qty 11

## 2024-12-16 MED ORDER — PANTOPRAZOLE SODIUM 40 MG IV SOLR
40.0000 mg | Freq: Once | INTRAVENOUS | Status: AC
  Administered 2024-12-16: 13:00:00 40 mg via INTRAVENOUS
  Filled 2024-12-16: qty 10

## 2024-12-16 MED ORDER — ACETAMINOPHEN 325 MG PO TABS: 650.0000 mg | ORAL_TABLET | Freq: Four times a day (QID) | ORAL | Status: DC | PRN

## 2024-12-16 MED ORDER — LACTATED RINGERS IV BOLUS
500.0000 mL | Freq: Once | INTRAVENOUS | Status: AC
  Administered 2024-12-16: 13:00:00 500 mL via INTRAVENOUS

## 2024-12-16 MED ORDER — POLYETHYLENE GLYCOL 3350 17 G PO PACK: 17.0000 g | PACK | Freq: Every day | ORAL | Status: DC | PRN

## 2024-12-16 MED ORDER — MELATONIN 5 MG PO TABS
5.0000 mg | ORAL_TABLET | Freq: Every evening | ORAL | Status: DC | PRN
  Administered 2024-12-17 – 2024-12-19 (×4): 5 mg via ORAL
  Filled 2024-12-16 (×4): qty 1

## 2024-12-16 MED ORDER — IOHEXOL 350 MG/ML SOLN
75.0000 mL | Freq: Once | INTRAVENOUS | Status: AC | PRN
  Administered 2024-12-16: 12:00:00 75 mL via INTRAVENOUS

## 2024-12-16 MED ORDER — ACETAMINOPHEN 650 MG RE SUPP: 650.0000 mg | Freq: Four times a day (QID) | RECTAL | Status: DC | PRN

## 2024-12-16 MED ADMIN — Apixaban Tab 5 MG: 5 mg | ORAL | NDC 00003089431

## 2024-12-16 MED FILL — Apixaban Tab 5 MG: 5.0000 mg | ORAL | Qty: 1 | Status: AC

## 2024-12-16 NOTE — Progress Notes (Signed)
 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  Pharmacy has been consulted for Merrem  dosing.  Plan: Merrem  1g IV q 12h Monitor renal function, Cx and clinical progression to narrow or d/c  Height: 5' 11 (180.3 cm) Weight: 66.2 kg (146 lb) IBW/kg (Calculated) : 75.3  Temp (24hrs), Avg:93.4 F (34.1 C), Min:90.3 F (32.4 C), Max:97.4 F (36.3 C)  Recent Labs  Lab 12/16/24 1007 12/16/24 1020 12/16/24 1029 12/16/24 1242  WBC  --  8.5  --   --   CREATININE  --  1.14 1.20  --   LATICACIDVEN 0.4*  --   --  0.3*    Estimated Creatinine Clearance: 36 mL/min (by C-G formula based on SCr of 1.2 mg/dL).    Allergies[1]  Dorn Poot, PharmD, [facility] Clinical Pharmacist ED Pharmacist Phone # [phone] 12/16/2024 3:43 PM     [1]  Allergies Allergen Reactions   Aspirin     GI Bleeding Other reaction(s): upset stomach   Bee Venom     Other reaction(s): Unknown   Tamsulosin     Other reaction(s): increased frequency   Nifedipine Other (See Comments)    Headaches  Other reaction(s): headaches

## 2024-12-16 NOTE — ED Notes (Signed)
 Male Purewick placed on pt

## 2024-12-16 NOTE — H&P (Addendum)
 History and Physical   Jared Flinn, PhD FMW:[id number] DOB: September 29, 1929 DOA: 12/16/2024  PCP: [name], MD   Patient coming from: Home  Chief Complaint: Weakness, confusion  HPI: Jared Mannan, PhD is a 89 y.o. male with medical history significant of hypertension, CVA, GERD, A-fib, carotid artery disease, mild dementia, thrombocytopenia, COPD, BPH with urinary symptoms, ESBL colonization, status post left shoulder cholecystectomy presenting with weakness and confusion.  History obtained with assistance of chart review and family.  Patient has had 4 days of weakness and change in mental status.  Patient has had slowing of his speech report as well.  Also has been evaluated recently for bradycardia and low blood pressure episodes with PCP.  In the setting of A-fib.  Amlodipine  and Donepezil  was discontinued.  At baseline patient is able to walk with a cane is alert and oriented x 4 and able to perform all ADLs.  Family reports he has had some intermittent slurred speech as well.  Also had at least 1 episode where he had left facial droop.  EMS was called due to persistent/worsening symptoms and found patient to be bradycardic in the 20s.  Patient was given 1 mg of atropine with improvement of heart rate to the 70s and transported to the ED.  Patient denies fever, chills, chest pain, abdominal pain, constipation, nausea, vomiting  ED Course: Vital signs in the ED notable for temperature initially 90.3 with improvement to 92.4 with warming blanket.  Heart rate in the 40s-60s.  Blood pressure in the 100s systolic.  Lab work in the ED notable for CMP with BUN 47, glucose 151, calcium  8.4, protein 5.2.  CBC with hemoglobin 10.4 from unknown baseline, platelets 59 from unknown baseline.  PT 24.6, INR 2.1.  Lactic acid normal x 2.  Lipase pending.  Troponin pending.  BNP pending.  Urinalysis pending.  Blood cultures pending.  Smear pending.  VBG with normal pH and pCO2 40.2.  Chest x-ray  pending.  CT head showed no acute abnormality, showed chronic changes including meningioma.  CT abdomen pelvis showed wall thickening of the duodenum representing duodenitis versus PUD.  Also noted cystic structure at the pancreatic head representing cyst versus pseudocyst versus less likely cystic neoplasm.  Recommend follow-up in 2 years.  Additionally noted liver steatosis versus cirrhosis, diverticulosis, BPH.  Aortic atherosclerosis with possible focal dissection.  Pleural effusion also noted with adjacent atelectasis versus infection.  Patient received 500 cc IV fluid and placed on a Bair hugger.  Urology consulted for assistance with difficult to place urinary catheter.  Recommended hydrating patient as they suspect hypoperfusion due to bradycardia leading to poor urine output and not significant quantity of urine in the bladder to catheterize.    Review of Systems: As per HPI otherwise all other systems reviewed and are negative.  Past Medical History:  Diagnosis Date   Acute, but ill-defined, cerebrovascular disease 04/07/2014   Adenomatous colon polyp    Allergy    Atrial fibrillation (HCC)    BPH (benign prostatic hyperplasia)    Cataract    removed both eyes    Community acquired pneumonia 03/05/2022   COPD (chronic obstructive pulmonary disease) (HCC)    Eczema    Erectile dysfunction    Gallstones    GERD (gastroesophageal reflux disease)    Heart murmur    HOH (hard of hearing)    HTN (hypertension)    Meningioma (HCC)    Right frontal   Meralgia paraesthetica    Palpitations  Urinary retention     Past Surgical History:  Procedure Laterality Date   BLADDER DIVERTICULECTOMY  02/15/2003   thelbert 05/16/2011   CARDIAC CATHETERIZATION     CARDIOVERSION N/A 06/17/2020   Procedure: CARDIOVERSION;  Surgeon: Delford [name], MD;  Location: [facility] ENDOSCOPY;  Service: Cardiovascular;  Laterality: N/A;   CATARACT EXTRACTION Bilateral 2013   CHOLECYSTECTOMY N/A 11/19/2014    Procedure: LAPAROSCOPIC CHOLECYSTECTOMY WITH INTRAOPERATIVE CHOLANGIOGRAM;  Surgeon: Krystal Russell, MD;  Location: [facility] OR;  Service: General;  Laterality: N/A;   COLONOSCOPY     COLONOSCOPY W/ POLYPECTOMY     GUM SURGERY  1999   INGUINAL HERNIA REPAIR Left    INGUINAL HERNIA REPAIR Right 11/2009   LAPAROSCOPIC CHOLECYSTECTOMY  11/19/2014   w/IOC   MEATOTOMY  06/2003   Distal urethral meatotomy with YAG laser/notes 05/16/2011   POLYPECTOMY     SUPRAPUBIC PROSTATECTOMY  02/15/2003   thelbert 05/16/2011    Social History  reports that he quit smoking about 25 years ago. His smoking use included cigarettes. He started smoking about 65 years ago. He has a 8 pack-year smoking history. He has never used smokeless tobacco. He reports that he does not currently use alcohol. He reports that he does not use drugs.  Allergies[1]  Family History  Problem Relation Age of Onset   Alzheimer's disease Mother    Heart attack Father    Cancer - Lung Sister    Colon cancer Neg Hx    Colon polyps Neg Hx    Esophageal cancer Neg Hx    Rectal cancer Neg Hx    Stomach cancer Neg Hx   Reviewed on mission  Prior to Admission medications  Medication Sig Start Date End Date Taking? Authorizing Provider  acetaminophen  (TYLENOL ) 500 MG tablet Take 500 mg by mouth every 6 (six) hours as needed for mild pain, moderate pain or fever.    [provider]  amLODipine  (NORVASC ) 10 MG tablet Take 10 mg by mouth daily.    [provider]  cholecalciferol (VITAMIN D3) 25 MCG (1000 UNIT) tablet Take 1,000 Units by mouth daily.    [provider]  ciclopirox (LOPROX) 0.77 % cream Apply 1 application topically daily. 01/20/20   [provider]  donepezil  (ARICEPT ) 10 MG tablet Take 10 mg by mouth at bedtime. 11/23/24   [provider]  donepezil  (ARICEPT ) 5 MG tablet 1 tablet at bedtime Orally Once a day; Duration: 30 days 09/24/24   [provider]  dorzolamide -timolol   (COSOPT ) 22.3-6.8 MG/ML ophthalmic solution Place 1 drop into the left eye 2 (two) times daily. 11/21/21   [provider]  ELIQUIS  5 MG TABS tablet TAKE 1 TABLET(5 MG) BY MOUTH TWICE DAILY 07/07/24   Delford [name], MD  esomeprazole  (NEXIUM ) 20 MG capsule Take 20 mg by mouth daily. 12/21/21   [provider]  fexofenadine  (ALLEGRA  ALLERGY) 180 MG tablet Take 1 tablet (180 mg total) by mouth daily for 15 days. 04/25/24 12/10/24  Teddy Sharper, FNP  ketotifen  (ZADITOR ) 0.025 % ophthalmic solution Place 1 drop into both eyes daily.    [provider]  latanoprost  (XALATAN ) 0.005 % ophthalmic solution Place 1 drop into both eyes at bedtime. 12/14/19   [provider]  melatonin (MELATONIN MAXIMUM STRENGTH) 5 MG TABS Take 1 tablet (5 mg total) by mouth every evening. 11/07/23     MELATONIN PO Take 1 tablet by mouth at bedtime.    [provider]  Polyethyl Glycol-Propyl Glycol (  SYSTANE OP) Place 1 drop into both eyes daily as needed (dry eyes).    [provider]  terazosin  (HYTRIN ) 10 MG capsule Take 10 mg by mouth at bedtime.  11/21/12   [provider]  triamcinolone  cream (KENALOG) 0.1 % Apply 1 application topically daily as needed (dry and itching skin).  02/09/20   [provider]    Physical Exam: Vitals:   12/16/24 1329 12/16/24 1333 12/16/24 1343 12/16/24 1415  BP:  134/68  128/68  Pulse:   (!) 45 (!) 46  Resp:   14 18  Temp: (S) (!) 92.4 F (33.6 C)     TempSrc: (S) Rectal     SpO2:   96% 96%  Weight:      Height:        Physical Exam Constitutional:      General: He is not in acute distress.    Appearance: Normal appearance.  HENT:     Head: Normocephalic and atraumatic.     Mouth/Throat:     Mouth: Mucous membranes are moist.     Pharynx: Oropharynx is clear.  Eyes:     Extraocular Movements: Extraocular movements intact.     Pupils: Pupils are equal, round, and reactive to light.  Cardiovascular:      Rate and Rhythm: Normal rate and regular rhythm.     Pulses: Normal pulses.     Heart sounds: Normal heart sounds.  Pulmonary:     Effort: Pulmonary effort is normal. No respiratory distress.     Breath sounds: Normal breath sounds.  Abdominal:     General: Bowel sounds are normal. There is no distension.     Palpations: Abdomen is soft.     Tenderness: There is no abdominal tenderness.  Musculoskeletal:        General: No swelling or deformity.  Skin:    General: Skin is warm and dry.  Neurological:     General: No focal deficit present.     Comments: Mentation appears to be improved, near baseline, though hard of hearing. Strength and sensation grossly intact in setting of generalized weakness.    Labs on Admission: I have personally reviewed following labs and imaging studies  CBC: Recent Labs  Lab 12/16/24 1020 12/16/24 1029  WBC 8.5  --   NEUTROABS 1.4*  --   HGB 10.4* 10.2*  10.2*  HCT 33.3* 30.0*  30.0*  MCV 96.5  --   PLT 59*  --     Basic Metabolic Panel: Recent Labs  Lab 12/16/24 1020 12/16/24 1029  NA 138 138  137  K 4.5 4.3  4.3  CL 105 105  CO2 25  --   GLUCOSE 151* 150*  BUN 47* 44*  CREATININE 1.14 1.20  CALCIUM  8.4*  --     GFR: Estimated Creatinine Clearance: 36 mL/min (by C-G formula based on SCr of 1.2 mg/dL).  Liver Function Tests: Recent Labs  Lab 12/16/24 1020  AST 26  ALT 28  ALKPHOS 73  BILITOT 0.9  PROT 5.2*  ALBUMIN 3.8    Urine analysis:    Component Value Date/Time   BILIRUBINUR neg 09/07/2014 1101   PROTEINUR neg 09/07/2014 1101   UROBILINOGEN 0.2 09/07/2014 1101   NITRITE neg 09/07/2014 1101   LEUKOCYTESUR Negative 09/07/2014 1101    Radiological Exams on Admission: DG Chest Portable 1 View Result Date: 12/16/2024 CLINICAL DATA:  Pleural effusions versus infection. Weakness 4 days. EXAM: PORTABLE CHEST 1 VIEW COMPARISON:  10/25/2024 FINDINGS:  Lungs are adequately inflated demonstrate hazy bilateral  perihilar and bibasilar opacification likely mild interstitial edema with possible small layering effusions. Infection is less likely. Cardiomediastinal silhouette and remainder of the exam is unchanged. IMPRESSION: Hazy bilateral perihilar and bibasilar opacification likely mild interstitial edema with possible small layering effusions. Infection is less likely. Electronically Signed   By: Toribio Agreste M.D.   On: 12/16/2024 13:57   CT ABDOMEN PELVIS W CONTRAST Result Date: 12/16/2024 CLINICAL DATA:  Abdominal pain and gross hematuria with abdominal distension. EXAM: CT ABDOMEN AND PELVIS WITH CONTRAST TECHNIQUE: Multidetector CT imaging of the abdomen and pelvis was performed using the standard protocol following bolus administration of intravenous contrast. RADIATION DOSE REDUCTION: This exam was performed according to the departmental dose-optimization program which includes automated exposure control, adjustment of the mA and/or kV according to patient size and/or use of iterative reconstruction technique. CONTRAST:  75mL OMNIPAQUE  IOHEXOL  350 MG/ML SOLN COMPARISON:  07/07/2020 FINDINGS: Lower chest: Borderline cardiomegaly. Moderate size hiatal hernia unchanged. Lung bases demonstrate bilateral moderate size pleural effusions with associated bibasilar airspace density which may be atelectasis versus infection. Hepatobiliary: Prior cholecystectomy. Mild diffuse low-attenuation of the liver compatible with a degree of steatosis or possible cirrhosis as there is minimal nodular contour to the liver. No focal liver mass. Biliary tree is unremarkable. Pancreas: 2.5 cm simple cystic structure over the head of the pancreas which is new. Somewhat ill-defined 1.3 cm low-attenuation over the more distal aspect of the pancreatic head unchanged. Fatty atrophy of the pancreas. Spleen: Mild stable splenomegaly. Adrenals/Urinary Tract: Adrenal glands are normal. Kidneys are normal in size. No evidence of nephrolithiasis.  Bilateral renal cysts unchanged. Likely parapelvic left renal cysts without significant change. Visualized ureters and bladder are unremarkable. Stomach/Bowel: Stomach is within normal. There is mild stranding of the fat anterior to the body of the stomach. Mild wall thickening of the first and second portion of the duodenal C sweep with adjacent free fluid. Findings may be due to duodenitis/peptic ulcer disease. Appendix not well visualized. Diverticulosis over the distal descending and sigmoid colon. No evidence of active inflammation. Vascular/Lymphatic: Calcified plaque over the abdominal aorta. Possible focal dissection of the infrarenal abdominal aorta as the aorta is otherwise normal in caliber. No adenopathy. Reproductive: Stable enlargement of the prostate with impression upon the bladder base. Other: Mild-to-moderate free fluid over the pelvis which is new. Minimal free fluid over the pericolic gutters. Musculoskeletal: No focal abnormality. Degenerative changes of the spine and hips. IMPRESSION: 1. Mild wall thickening of the first and second portion of the duodenal C sweep with adjacent free fluid. Findings may be due to duodenitis/peptic ulcer disease. 2. New 2.5 cm simple cystic structure over the head of the pancreas. This may represent a pseudocyst or or cyst and less likely low-grade cystic neoplasm. Stable 1.3 cm low-attenuation over the more distal aspect of the pancreatic head. Recommend follow-up CT 2 years. 3. Mild diffuse low-attenuation of the liver compatible with a degree of steatosis or possible cirrhosis. Mild stable splenomegaly. Minimal ascites. 4. Colonic diverticulosis without active inflammation. 5. Table enlargement of the prostate with impression upon the bladder base. 6. Moderate size bilateral pleural effusions with associated bibasilar airspace density which may be atelectasis versus infection. 7. Aortic atherosclerosis. Possible focal dissection of the infrarenal abdominal  aorta as the aorta is otherwise normal in caliber. Aortic Atherosclerosis (ICD10-I70.0). Electronically Signed   By: Toribio Agreste M.D.   On: 12/16/2024 12:51   CT Head Wo Contrast Result Date:  12/16/2024 EXAM: CT HEAD WITHOUT CONTRAST 12/16/2024 10:40:49 AM TECHNIQUE: CT of the head was performed without the administration of intravenous contrast. Automated exposure control, iterative reconstruction, and/or weight based adjustment of the mA/kV was utilized to reduce the radiation dose to as low as reasonably achievable. COMPARISON: MRI of the head dated 12/13/2024. CLINICAL HISTORY: Mental status change, unknown cause. FINDINGS: BRAIN AND VENTRICLES: No acute hemorrhage. No evidence of acute infarct. No hydrocephalus. No extra-axial collection. No mass effect or midline shift. Moderate chronic microvascular ischemic change sequela. Calcified meningioma along right frontal convexity. ORBITS: Bilateral lens replacement. SINUSES: No acute abnormality. SOFT TISSUES AND SKULL: No acute soft tissue abnormality. No skull fracture. IMPRESSION: 1. No acute intracranial abnormality. 2. Moderate chronic microvascular ischemic change sequela. 3. Calcified meningioma along right frontal convexity. Electronically signed by: Evalene Coho MD 12/16/2024 10:47 AM EST RP Workstation: [id number]   EKG: Independently reviewed.  A-fib, 59 beats minute.  Nonspecific T wave changes.  Low voltage multiple leads.  QTc 525.  Assessment/Plan Principal Problem:   Hypothermia Active Problems:   COPD (chronic obstructive pulmonary disease) (HCC)   HTN (hypertension)   Paroxysmal A-fib (HCC)   GERD (gastroesophageal reflux disease)   History of stroke without residual deficits   Mild dementia (HCC)   Occlusion and stenosis of bilateral carotid arteries   Lower urinary tract symptoms due to benign prostatic hyperplasia   Thrombocytopenia   Encephalopathy Bradycardia Weakness Hypothermia Rule out sepsis > Patient  presenting with weakness, bradycardia, confusion. > Recently seen outpatient by PCP and then cardiology with episodes of bradycardia and hypotension in the setting of A-fib.  Antihypertensives were discontinued at that time. > Baseline heart rate appears to be in the 50s from available recent records > Suspect his confusion/slowed speech, bradycardia, weakness are all related to his hypothermia.  Found to be 90.3 degrees on arrival, improving to 92.4 on repeat. > Unclear etiology for hypothermia.  Urinalysis is pending, patient has history of colonization but antibiotics not recommended at this time by urology in ED.  Pleural effusion with atelectasis versus infection.  Chest x-ray read shows infection less likely in the setting of edema and effusions. > CT on pelvis without evidence of obvious infection.  Possible inflammation at the duodenum and other abnormalities as below. > Started on Bair hugger/active rewarming and received 500 cc IV fluid in the ED. - Monitor on progressive unit - Cardiology consult - Continue with active rewarming, spare limbs to avoid peripheral vasodilation and hypotension - Will cover with meropenem  for possible infection/sepsis etiology of his hypothermia without alternative explanation at this time - Follow-up urinalysis, blood cultures - Trend temperature - Check TSH, a.m. cortisol - IV fluids, gentle in the setting of elevated BNP - Supportive care  Abnormal CT > CT findings included pancreatic head cystic structure with recommendation to follow-up in 2 years. (Cyst versus pseudocyst versus (less likely) cystic neoplasm) > CT findings also included aortic atherosclerosis with questionable focal dissection. > CT also showed wall thickening of the duodenum representing likely duodenitis versus PUD. - PPI for duodenitis/PUD - Follow-up as indicated  A-fib > With intermittent SVR in the setting of hypothermia as above.,  Though appeared to have some preceding  this. - Continue with Eliquis  - Treatment for hypothermia as above  Prolonged QTc > QTc prolonged to 525. - AM EKG - Avoid QTc prolonging medications  Transient focal neurologic deficit > Patient has had episodes of slurred speech and at least 1 episode of facial droop. >  Concern for possible TIA versus CVA in the setting of his recent significant bradycardia and subsequent hypoperfusion. > CT head was stable. - Check MRI brain - Swallow screen prior to starting diet  BPH with urinary symptoms > History of ESBL colonization > Urology saw in the ED recommended against placing catheter as patient is likely not producing enough urine at this time, recommend hydrating and monitoring for output. - Continue terazosin  as blood pressure tolerates  Elevated BNP > proBNP came back elevated to 2863. > Unclear if this represents some component of new CHF versus strain from his bradycardia.  Does appear dry on exam. - Cardiology consulted - Echocardiogram - Check magnesium  as above - I's and O's, daily weights - Gentle IV fluids  Hypertension > History of this, now having more issues with hypotension. - Continue with IV fluids  History of CVA Carotid artery disease - Evaluating for possible recurrent CVA in the setting of bradycardia/hypoperfusion as above. - On Eliquis   GERD - Continue PPI  Mild dementia - Donepezil  on hold given bradycardia  Thrombocytopenia > History of this, but not clear baseline.  Currently 59. - Trend CBC - Follow-up smear   DVT prophylaxis: Eliquis   Code Status:   Full, discussed with patient and family bedside.  Patient is full code, would not want to remain on ventilator support long-term. Family Communication:  Updated at bedside, significant other and daughter. Disposition Plan:   Patient is from:  Home  Anticipated DC to:  Pending clinical course  Anticipated DC date:  2 to 7 days  Anticipated DC barriers: None  Consults called:  Urology,  Cardiology  Admission status:  Inpatient, progressive  Severity of Illness: The appropriate patient status for this patient is INPATIENT. Inpatient status is judged to be reasonable and necessary in order to provide the required intensity of service to ensure the patient's safety. The patient's presenting symptoms, physical exam findings, and initial radiographic and laboratory data in the context of their chronic comorbidities is felt to place them at high risk for further clinical deterioration. Furthermore, it is not anticipated that the patient will be medically stable for discharge from the hospital within 2 midnights of admission.   * I certify that at the point of admission it is my clinical judgment that the patient will require inpatient hospital care spanning beyond 2 midnights from the point of admission due to high intensity of service, high risk for further deterioration and high frequency of surveillance required.Jared Marsa KATHEE Seena MD [facility]  How to contact the [facility] Attending or Consulting provider 7A - 7P or covering provider during after hours 7P -7A, for this patient?   Check the care team in [facility] and look for a) attending/consulting [facility] provider listed and b) the [facility] team listed Log into [email or web address] and use [place]'s universal password to access. If you do not have the password, please contact the hospital operator. Locate the [facility] provider you are looking for under [facility] and page to a number that you can be directly reached. If you still have difficulty reaching the provider, please page the [facility] (Director on Call) for the Hospitalists listed on amion for assistance.  12/16/2024, 2:31 PM       [1]  Allergies Allergen Reactions   Aspirin     GI Bleeding Other reaction(s): upset stomach   Bee Venom     Other reaction(s): Unknown   Tamsulosin     Other reaction(s): increased frequency  Nifedipine Other (See Comments)     Headaches  Other reaction(s): headaches

## 2024-12-16 NOTE — ED Provider Notes (Signed)
 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 For past 4 days weakness has increased and patients daughter noticed slowing of patients speech.   Partner at bedside reports more weakness/confusion over last few days. Baseline is walking with cane, A&Ox4, does most of his own ADLs. Recently seen by PCP w/ h/o Afib/ bradycardia and having more hypotensive/bradycardic episodes, so PCP discontinued aricept  and amlodipine . Partner states slower to respond, slurred speech, unable to get up/move around as well as decreased UOP, anorexia. HR initially w/ EMS was 20 bpm, given 1 mg IV atropine and HR then 70s. On my assessment, HR now in 41s which seems like his baseline.      EMS fluids  A&Ox4 HR 20, 1 mg atropine given then HR back to 70 500 fluid 108/66 bp 118 cbg Capnography 17    HX bradycardia, afib , (family mentioned unched bacteria colonization in gut**) 18GLAC   .    Past Medical History:  Diagnosis Date   Acute, but ill-defined, cerebrovascular disease 04/07/2014   Adenomatous colon polyp    Allergy    Atrial fibrillation (HCC)    BPH (benign prostatic hyperplasia)    Cataract    removed both eyes    COPD (chronic obstructive pulmonary disease) (HCC)    Eczema    Erectile dysfunction    Gallstones    GERD (gastroesophageal reflux disease)    Heart murmur    HOH (hard of hearing)    HTN (hypertension)    Meningioma (HCC)    Right frontal   Meralgia paraesthetica    Palpitations    Urinary retention        Home Medications Prior to Admission medications  Medication Sig Start Date End Date Taking? Authorizing Provider  acetaminophen   (TYLENOL ) 500 MG tablet Take 500 mg by mouth every 6 (six) hours as needed for mild pain, moderate pain or fever.    [provider]  amLODipine  (NORVASC ) 10 MG tablet Take 10 mg by mouth daily.    [provider]  cholecalciferol (VITAMIN D3) 25 MCG (1000 UNIT) tablet Take 1,000 Units by mouth daily.    [provider]  ciclopirox (LOPROX) 0.77 % cream Apply 1 application topically daily. 01/20/20   [provider]  donepezil  (ARICEPT ) 10 MG tablet Take 10 mg by mouth at bedtime. 11/23/24   [provider]  donepezil  (ARICEPT ) 5 MG tablet 1 tablet at bedtime Orally Once a day; Duration: 30 days 09/24/24   [provider]  dorzolamide -timolol  (COSOPT ) 22.3-6.8 MG/ML ophthalmic solution Place 1 drop into the left eye 2 (two) times daily. 11/21/21   [provider]  ELIQUIS  5 MG TABS tablet TAKE 1 TABLET(5 MG) BY MOUTH TWICE DAILY 07/07/24   Jared [name], MD  esomeprazole  (NEXIUM ) 20 MG capsule Take 20 mg by mouth daily. 12/21/21   [provider]  fexofenadine  (ALLEGRA  ALLERGY) 180 MG tablet Take 1 tablet (180 mg total) by mouth daily for 15 days. 04/25/24 12/10/24  Teddy Sharper, FNP  ketotifen  (ZADITOR ) 0.025 % ophthalmic solution Place 1 drop into both eyes daily.    [provider]  latanoprost  (  XALATAN ) 0.005 % ophthalmic solution Place 1 drop into both eyes at bedtime. 12/14/19   [provider]  melatonin (MELATONIN MAXIMUM STRENGTH) 5 MG TABS Take 1 tablet (5 mg total) by mouth every evening. 11/07/23     MELATONIN PO Take 1 tablet by mouth at bedtime.    [provider]  Polyethyl Glycol-Propyl Glycol (SYSTANE OP) Place 1 drop into both eyes daily as needed (dry eyes).    [provider]  terazosin  (HYTRIN ) 10 MG capsule Take 10 mg by mouth at bedtime.  11/21/12   [provider]  triamcinolone  cream (KENALOG) 0.1 % Apply 1 application topically daily as needed (dry and itching  skin).  02/09/20   [provider]      Allergies    Aspirin, Bee venom, Tamsulosin, and Nifedipine    Review of Systems   Review of Systems A 10 point review of systems was performed and is negative unless otherwise reported in HPI.  Physical Exam Updated Vital Signs BP (!) 105/94   Pulse 66   Temp (!) 90.3 F (32.4 C) (Rectal)   Resp 17   Ht 5' 11 (1.803 m)   Wt 66.2 kg   SpO2 94%   BMI 20.36 kg/m  Physical Exam General: Normal appearing {Desc; male/male:11659}, lying in bed.  HEENT: PERRLA, Sclera anicteric, MMM, trachea midline.  Cardiology: RRR, no murmurs/rubs/gallops. BL radial and DP pulses equal bilaterally.  Resp: Normal respiratory rate and effort. CTAB, no wheezes, rhonchi, crackles.  Abd: Soft, non-tender, non-distended. No rebound tenderness or guarding.  GU: Deferred. MSK: No peripheral edema or signs of trauma. Extremities without deformity or TTP. No cyanosis or clubbing. Skin: warm, dry. No rashes or lesions. Back: No CVA tenderness Neuro: A&Ox4, CNs II-XII grossly intact. MAEs. Sensation grossly intact.  Psych: Normal mood and affect.   ED Results / Procedures / Treatments   Labs (all labs ordered are listed, but only abnormal results are displayed) Labs Reviewed  I-STAT CG4 LACTIC ACID, ED - Abnormal; Notable for the following components:      Result Value   Lactic Acid, Venous 0.4 (*)    All other components within normal limits  CULTURE, BLOOD (ROUTINE X 2)  CULTURE, BLOOD (ROUTINE X 2)  COMPREHENSIVE METABOLIC PANEL WITH GFR  CBC WITH DIFFERENTIAL/PLATELET  PROTIME-INR  URINALYSIS, W/ REFLEX TO CULTURE (INFECTION SUSPECTED)    EKG None  Radiology No results found.  Procedures Procedures  {Document cardiac monitor, telemetry assessment procedure when appropriate:1}  Medications Ordered in ED Medications - No data to display  ED Course/ Medical Decision Making/ A&P                          Medical Decision  Making Amount and/or Complexity of Data Reviewed Labs: ordered. Decision-making details documented in ED Course. Radiology: ordered.    This patient presents to the ED for concern of ***, this involves an extensive number of treatment options, and is a complaint that carries with it a high risk of complications and morbidity.  I considered the following differential and admission for this acute, potentially life threatening condition.   MDM:    ***  Clinical Course as of 12/16/24 1046  Wed Dec 16, 2024  1023 Lactic Acid, Venous(!): 0.4 [HN]    Clinical Course User Index [HN] Franklyn Sid SAILOR, MD    Labs: I Ordered, and personally interpreted labs.  The pertinent results include:  ***  Imaging Studies  ordered: I ordered imaging studies including *** I independently visualized and interpreted imaging. I agree with the radiologist interpretation  Additional history obtained from ***.  External records from outside source obtained and reviewed including ***  Cardiac Monitoring: The patient was maintained on a cardiac monitor.  I personally viewed and interpreted the cardiac monitored which showed an underlying rhythm of: ***  Reevaluation: After the interventions noted above, I reevaluated the patient and found that they have :{resolved/improved/worsened:23923::improved}  Social Determinants of Health: ***  Disposition:  ***  Co morbidities that complicate the patient evaluation  Past Medical History:  Diagnosis Date   Acute, but ill-defined, cerebrovascular disease 04/07/2014   Adenomatous colon polyp    Allergy    Atrial fibrillation (HCC)    BPH (benign prostatic hyperplasia)    Cataract    removed both eyes    COPD (chronic obstructive pulmonary disease) (HCC)    Eczema    Erectile dysfunction    Gallstones    GERD (gastroesophageal reflux disease)    Heart murmur    HOH (hard of hearing)    HTN (hypertension)    Meningioma (HCC)    Right frontal    Meralgia paraesthetica    Palpitations    Urinary retention      Medicines No orders of the defined types were placed in this encounter.   I have reviewed the patients home medicines and have made adjustments as needed  Problem List / ED Course: Problem List Items Addressed This Visit   None        {Document critical care time when appropriate:1} {Document review of labs and clinical decision tools ie heart score, Chads2Vasc2 etc:1}  {Document your independent review of radiology images, and any outside records:1} {Document your discussion with family members, caretakers, and with consultants:1} {Document social determinants of health affecting pt's care:1} {Document your decision making why or why not admission, treatments were needed:1}  This note was created using dictation software, which may contain spelling or grammatical errors.

## 2024-12-16 NOTE — ED Notes (Signed)
 Pt provided with full linen and gown change.

## 2024-12-16 NOTE — ED Notes (Signed)
 Urology cart ordered from OR.

## 2024-12-16 NOTE — ED Notes (Signed)
Urology cart at bedside 

## 2024-12-16 NOTE — Consult Note (Addendum)
 Urology Consult Note   Requesting Attending Physician:  Jared Sid SAILOR, MD Service Providing Consult: Urology  Consulting Attending: Dr. Gaston   Reason for Consult:  bleeding per urethra  HPI: Jared Mannan, PhD is seen in consultation for reasons noted above at the request of Jared Sid SAILOR, MD. PMH significant for diverticulectomy, BPH s/p suprapubic prostatectomy, COPD, HTN, meningioma, and ESBL UTI.  Patient is a 89 y.o. male presenting to [facility] emergency department for weakness and slurred speech.  His long-term partner is at bedside who reports worsening weakness and confusion for several days.  Recently after seeing his PCP, Aricept  and amlodipine  were discontinued following multiple hypotensive and bradycardic episodes.  EMS was summoned this morning at which time they found the patient to have a HR of 20 bpm.  He was given 1 mg of atropine and heart rate elevated to 70s.  His partner also reports a urinary urgency overnight.  Bladder scan in emergency department of 137 mL.  In-N-Out catheterization was attempted unsuccessfully with bleeding per urethra for which urology was consulted.    Patient was previously followed by our practice, last seen about 5 years ago for BPH.  PMH significant for suprapubic prostatectomy in 2012 with regrowth of tissue, 86 g prostate noted in 2016 with ongoing BPH and LUTS.  On my arrival patient was alert and appeared oriented.  He is extremely hard of hearing.  His long-term live-in partner was an excellent historian.  Case and plan was reviewed with them both and all questions answered to their satisfaction.  ------------------  Assessment:   89 y.o. male with bradycardia and bleeding per urethra after attempted in and out cath.    Recommendations: # BPH # Low UOP   CT A/P collected with no blood products appreciated in urinary system.  Previously established BPH was stable with encroachment into the bladder.  This was most likely  causing the difficulty advancing catheter and consequent hematuria after attempting to In&Out.  Considering his bladder scan is so low, he most likely does not have any urge to urinate at this time.  His low urine output is most likely d/t poor renal perfusion in the context of his severe bradycardia.  I would avoid further catheterization attempts at this time, focus on volume resuscitation, and wait for him to generate some urine.  # Hx of ESBL UTI versus chronic colonization Followed by ID earlier this year for ESBL Kleb pneumonia.  His cultures are not available to me at this time but in reviewing infectious disease notes, the 10,000 CFU's are favoring colonization that is not actionable.  Agree with holding ABX unless symptomatic.  Will not need to follow.  Please feel free to call with questions, concerns, or acute urologic changes.  If patient goes into a urinary obstruction, bladder scan greater than 500 mL, I would inject the full 10 mL of lidocaine  infused lubricant directly into the urethra and then attempt to advance an 70f coud catheter.  Please consult on-call urology if assistance is required.  Case and plan discussed with Dr. Gaston - I REVIEWED CHART AND AGREE WITH WATCHFUL WAITING VS. TRYING TO PLACE CATHETER - WILL FOLLOW  Past Medical History: Past Medical History:  Diagnosis Date   Acute, but ill-defined, cerebrovascular disease 04/07/2014   Adenomatous colon polyp    Allergy    Atrial fibrillation (HCC)    BPH (benign prostatic hyperplasia)    Cataract    removed both eyes    COPD (  chronic obstructive pulmonary disease) (HCC)    Eczema    Erectile dysfunction    Gallstones    GERD (gastroesophageal reflux disease)    Heart murmur    HOH (hard of hearing)    HTN (hypertension)    Meningioma (HCC)    Right frontal   Meralgia paraesthetica    Palpitations    Urinary retention     Past Surgical History:  Past Surgical History:  Procedure Laterality Date    BLADDER DIVERTICULECTOMY  02/15/2003   thelbert 05/16/2011   CARDIAC CATHETERIZATION     CARDIOVERSION N/A 06/17/2020   Procedure: CARDIOVERSION;  Surgeon: Delford [name], MD;  Location: [facility] ENDOSCOPY;  Service: Cardiovascular;  Laterality: N/A;   CATARACT EXTRACTION Bilateral 2013   CHOLECYSTECTOMY N/A 11/19/2014   Procedure: LAPAROSCOPIC CHOLECYSTECTOMY WITH INTRAOPERATIVE CHOLANGIOGRAM;  Surgeon: Krystal Russell, MD;  Location: [facility] OR;  Service: General;  Laterality: N/A;   COLONOSCOPY     COLONOSCOPY W/ POLYPECTOMY     GUM SURGERY  1999   INGUINAL HERNIA REPAIR Left    INGUINAL HERNIA REPAIR Right 11/2009   LAPAROSCOPIC CHOLECYSTECTOMY  11/19/2014   w/IOC   MEATOTOMY  06/2003   Distal urethral meatotomy with YAG laser/notes 05/16/2011   POLYPECTOMY     SUPRAPUBIC PROSTATECTOMY  02/15/2003   thelbert 05/16/2011    Medication: Current Facility-Administered Medications  Medication Dose Route Frequency Provider Last Rate Last Admin   pantoprazole  (PROTONIX ) injection 40 mg  40 mg Intravenous Once Jared Sid SAILOR, MD       Current Outpatient Medications  Medication Sig Dispense Refill   acetaminophen  (TYLENOL ) 500 MG tablet Take 500 mg by mouth every 6 (six) hours as needed for mild pain, moderate pain or fever.     amLODipine  (NORVASC ) 10 MG tablet Take 10 mg by mouth daily.     cholecalciferol (VITAMIN D3) 25 MCG (1000 UNIT) tablet Take 1,000 Units by mouth daily.     ciclopirox (LOPROX) 0.77 % cream Apply 1 application topically daily.     donepezil  (ARICEPT ) 10 MG tablet Take 10 mg by mouth at bedtime.     donepezil  (ARICEPT ) 5 MG tablet 1 tablet at bedtime Orally Once a day; Duration: 30 days     dorzolamide -timolol  (COSOPT ) 22.3-6.8 MG/ML ophthalmic solution Place 1 drop into the left eye 2 (two) times daily.     ELIQUIS  5 MG TABS tablet TAKE 1 TABLET(5 MG) BY MOUTH TWICE DAILY 60 tablet 5   esomeprazole  (NEXIUM ) 20 MG capsule Take 20 mg by mouth daily.     fexofenadine  (ALLEGRA   ALLERGY) 180 MG tablet Take 1 tablet (180 mg total) by mouth daily for 15 days. 15 tablet 0   ketotifen  (ZADITOR ) 0.025 % ophthalmic solution Place 1 drop into both eyes daily.     latanoprost  (XALATAN ) 0.005 % ophthalmic solution Place 1 drop into both eyes at bedtime.     melatonin (MELATONIN MAXIMUM STRENGTH) 5 MG TABS Take 1 tablet (5 mg total) by mouth every evening. 30 tablet 0   MELATONIN PO Take 1 tablet by mouth at bedtime.     Polyethyl Glycol-Propyl Glycol (SYSTANE OP) Place 1 drop into both eyes daily as needed (dry eyes).     terazosin  (HYTRIN ) 10 MG capsule Take 10 mg by mouth at bedtime.      triamcinolone  cream (KENALOG) 0.1 % Apply 1 application topically daily as needed (dry and itching skin).       Allergies: Allergies[1]  Social History: Social History[2]  Family History Family History  Problem Relation Age of Onset   Alzheimer's disease Mother    Heart attack Father    Cancer - Lung Sister    Colon cancer Neg Hx    Colon polyps Neg Hx    Esophageal cancer Neg Hx    Rectal cancer Neg Hx    Stomach cancer Neg Hx     ROS   Objective   Vital signs in last 24 hours: BP 101/79   Pulse (!) 45   Temp (!) 90.3 F (32.4 C) (Rectal)   Resp 16   Ht 5' 11 (1.803 m)   Wt 66.2 kg   SpO2 96%   BMI 20.36 kg/m   Physical Exam General: A&O, resting, appropriate HEENT: [place]/AT Pulmonary: Normal work of breathing Cardiovascular: no cyanosis GU: Patient and incontinence brief.  Small amount of blood around urethral meatus.   Most Recent Labs: Lab Results  Component Value Date   WBC 8.5 12/16/2024   HGB 10.2 (L) 12/16/2024   HGB 10.2 (L) 12/16/2024   HCT 30.0 (L) 12/16/2024   HCT 30.0 (L) 12/16/2024   PLT 59 (L) 12/16/2024    Lab Results  Component Value Date   NA 137 12/16/2024   NA 138 12/16/2024   K 4.3 12/16/2024   K 4.3 12/16/2024   CL 105 12/16/2024   CO2 25 12/16/2024   BUN 44 (H) 12/16/2024   CREATININE 1.20 12/16/2024   CALCIUM  8.4 (L)  12/16/2024    Lab Results  Component Value Date   INR 2.1 (H) 12/16/2024     Urine Culture: @[id number] (laburin,org,[id number],[id number])@   IMAGING: CT ABDOMEN PELVIS W CONTRAST Result Date: 12/16/2024 CLINICAL DATA:  Abdominal pain and gross hematuria with abdominal distension. EXAM: CT ABDOMEN AND PELVIS WITH CONTRAST TECHNIQUE: Multidetector CT imaging of the abdomen and pelvis was performed using the standard protocol following bolus administration of intravenous contrast. RADIATION DOSE REDUCTION: This exam was performed according to the departmental dose-optimization program which includes automated exposure control, adjustment of the mA and/or kV according to patient size and/or use of iterative reconstruction technique. CONTRAST:  75mL OMNIPAQUE  IOHEXOL  350 MG/ML SOLN COMPARISON:  07/07/2020 FINDINGS: Lower chest: Borderline cardiomegaly. Moderate size hiatal hernia unchanged. Lung bases demonstrate bilateral moderate size pleural effusions with associated bibasilar airspace density which may be atelectasis versus infection. Hepatobiliary: Prior cholecystectomy. Mild diffuse low-attenuation of the liver compatible with a degree of steatosis or possible cirrhosis as there is minimal nodular contour to the liver. No focal liver mass. Biliary tree is unremarkable. Pancreas: 2.5 cm simple cystic structure over the head of the pancreas which is new. Somewhat ill-defined 1.3 cm low-attenuation over the more distal aspect of the pancreatic head unchanged. Fatty atrophy of the pancreas. Spleen: Mild stable splenomegaly. Adrenals/Urinary Tract: Adrenal glands are normal. Kidneys are normal in size. No evidence of nephrolithiasis. Bilateral renal cysts unchanged. Likely parapelvic left renal cysts without significant change. Visualized ureters and bladder are unremarkable. Stomach/Bowel: Stomach is within normal. There is mild stranding of the fat anterior to the body of the stomach. Mild wall thickening of the  first and second portion of the duodenal C sweep with adjacent free fluid. Findings may be due to duodenitis/peptic ulcer disease. Appendix not well visualized. Diverticulosis over the distal descending and sigmoid colon. No evidence of active inflammation. Vascular/Lymphatic: Calcified plaque over the abdominal aorta. Possible focal dissection of the infrarenal abdominal aorta as the aorta is otherwise normal in caliber. No adenopathy. Reproductive: Stable enlargement of the  prostate with impression upon the bladder base. Other: Mild-to-moderate free fluid over the pelvis which is new. Minimal free fluid over the pericolic gutters. Musculoskeletal: No focal abnormality. Degenerative changes of the spine and hips. IMPRESSION: 1. Mild wall thickening of the first and second portion of the duodenal C sweep with adjacent free fluid. Findings may be due to duodenitis/peptic ulcer disease. 2. New 2.5 cm simple cystic structure over the head of the pancreas. This may represent a pseudocyst or or cyst and less likely low-grade cystic neoplasm. Stable 1.3 cm low-attenuation over the more distal aspect of the pancreatic head. Recommend follow-up CT 2 years. 3. Mild diffuse low-attenuation of the liver compatible with a degree of steatosis or possible cirrhosis. Mild stable splenomegaly. Minimal ascites. 4. Colonic diverticulosis without active inflammation. 5. Table enlargement of the prostate with impression upon the bladder base. 6. Moderate size bilateral pleural effusions with associated bibasilar airspace density which may be atelectasis versus infection. 7. Aortic atherosclerosis. Possible focal dissection of the infrarenal abdominal aorta as the aorta is otherwise normal in caliber. Aortic Atherosclerosis (ICD10-I70.0). Electronically Signed   By: Toribio Agreste M.D.   On: 12/16/2024 12:51   CT Head Wo Contrast Result Date: 12/16/2024 EXAM: CT HEAD WITHOUT CONTRAST 12/16/2024 10:40:49 AM TECHNIQUE: CT of the head  was performed without the administration of intravenous contrast. Automated exposure control, iterative reconstruction, and/or weight based adjustment of the mA/kV was utilized to reduce the radiation dose to as low as reasonably achievable. COMPARISON: MRI of the head dated 12/13/2024. CLINICAL HISTORY: Mental status change, unknown cause. FINDINGS: BRAIN AND VENTRICLES: No acute hemorrhage. No evidence of acute infarct. No hydrocephalus. No extra-axial collection. No mass effect or midline shift. Moderate chronic microvascular ischemic change sequela. Calcified meningioma along right frontal convexity. ORBITS: Bilateral lens replacement. SINUSES: No acute abnormality. SOFT TISSUES AND SKULL: No acute soft tissue abnormality. No skull fracture. IMPRESSION: 1. No acute intracranial abnormality. 2. Moderate chronic microvascular ischemic change sequela. 3. Calcified meningioma along right frontal convexity. Electronically signed by: Evalene Coho MD 12/16/2024 10:47 AM EST RP Workstation: [id number]    ------  Ole Bourdon, NP Pager: [phone]   Please contact the urology consult pager with any further questions/concerns.      [1]  Allergies Allergen Reactions   Aspirin     GI Bleeding Other reaction(s): upset stomach   Bee Venom     Other reaction(s): Unknown   Tamsulosin     Other reaction(s): increased frequency   Nifedipine Other (See Comments)    Headaches  Other reaction(s): headaches  [2]  Social History Tobacco Use   Smoking status: Former    Current packs/day: 0.00    Average packs/day: 0.2 packs/day for 40.0 years (8.0 ttl pk-yrs)    Types: Cigarettes    Start date: 11/12/1959    Quit date: 11/12/1999    Years since quitting: 25.1   Smokeless tobacco: Never  Vaping Use   Vaping status: Never Used  Substance Use Topics   Alcohol use: Not Currently    Comment: occ   Drug use: No

## 2024-12-16 NOTE — ED Triage Notes (Signed)
 Pt bib gcems from home for weakness. For past 4 days weakness has increased and patients daughter noticed slowing of patients speech.    EMS fluids  A&Ox4 HR 20, 1 mg atropine given then HR back to 70 500 fluid 108/66 bp 118 cbg Capnography 17   HX bradycardia, afib , (family mentioned unched bacteria colonization in gut**) 18GLAC

## 2024-12-16 NOTE — ED Notes (Signed)
 Patient transported to CT

## 2024-12-16 NOTE — ED Notes (Addendum)
 This RN tried catherizing patient (another RN present)two times and clots came out with blood  , but no urine. MD notified.

## 2024-12-17 ENCOUNTER — Encounter (HOSPITAL_COMMUNITY): Admission: EM | Disposition: E | Payer: Self-pay | Source: Home / Self Care | Attending: Internal Medicine

## 2024-12-17 ENCOUNTER — Inpatient Hospital Stay (HOSPITAL_COMMUNITY)

## 2024-12-17 ENCOUNTER — Encounter (HOSPITAL_COMMUNITY): Payer: Self-pay | Admitting: Cardiovascular Disease

## 2024-12-17 ENCOUNTER — Other Ambulatory Visit: Payer: Self-pay

## 2024-12-17 DIAGNOSIS — I442 Atrioventricular block, complete: Secondary | ICD-10-CM

## 2024-12-17 DIAGNOSIS — I4891 Unspecified atrial fibrillation: Secondary | ICD-10-CM

## 2024-12-17 DIAGNOSIS — Z950 Presence of cardiac pacemaker: Secondary | ICD-10-CM

## 2024-12-17 DIAGNOSIS — A419 Sepsis, unspecified organism: Secondary | ICD-10-CM | POA: Diagnosis not present

## 2024-12-17 DIAGNOSIS — R001 Bradycardia, unspecified: Secondary | ICD-10-CM | POA: Diagnosis not present

## 2024-12-17 DIAGNOSIS — G9341 Metabolic encephalopathy: Secondary | ICD-10-CM

## 2024-12-17 DIAGNOSIS — I3139 Other pericardial effusion (noninflammatory): Secondary | ICD-10-CM

## 2024-12-17 DIAGNOSIS — J90 Pleural effusion, not elsewhere classified: Secondary | ICD-10-CM | POA: Diagnosis not present

## 2024-12-17 DIAGNOSIS — N401 Enlarged prostate with lower urinary tract symptoms: Secondary | ICD-10-CM | POA: Diagnosis not present

## 2024-12-17 HISTORY — PX: TEMPORARY PACEMAKER: CATH118268

## 2024-12-17 LAB — CORTISOL-AM, BLOOD: Cortisol - AM: 22.1 ug/dL (ref 6.7–22.6)

## 2024-12-17 LAB — COMPREHENSIVE METABOLIC PANEL WITH GFR
ALT: 29 U/L (ref 0–44)
AST: 25 U/L (ref 15–41)
Albumin: 3.9 g/dL (ref 3.5–5.0)
Alkaline Phosphatase: 78 U/L (ref 38–126)
Anion gap: 9 (ref 5–15)
BUN: 42 mg/dL — ABNORMAL HIGH (ref 8–23)
CO2: 25 mmol/L (ref 22–32)
Calcium: 8.7 mg/dL — ABNORMAL LOW (ref 8.9–10.3)
Chloride: 105 mmol/L (ref 98–111)
Creatinine, Ser: 1.13 mg/dL (ref 0.61–1.24)
GFR, Estimated: >60 mL/min (ref >60)
Glucose, Bld: 115 mg/dL — ABNORMAL HIGH (ref 70–99)
Potassium: 4.9 mmol/L (ref 3.5–5.1)
Sodium: 138 mmol/L (ref 135–145)
Total Bilirubin: 1.1 mg/dL (ref 0.0–1.2)
Total Protein: 5.3 g/dL — ABNORMAL LOW (ref 6.5–8.1)

## 2024-12-17 LAB — CBC
HCT: 33.8 % — ABNORMAL LOW (ref 39.0–52.0)
Hemoglobin: 10.7 g/dL — ABNORMAL LOW (ref 13.0–17.0)
MCH: 30.4 pg (ref 26.0–34.0)
MCHC: 31.7 g/dL (ref 30.0–36.0)
MCV: 96 fL (ref 80.0–100.0)
Platelets: 79 K/uL — ABNORMAL LOW (ref 150–400)
RBC: 3.52 MIL/uL — ABNORMAL LOW (ref 4.22–5.81)
RDW: 14.6 % (ref 11.5–15.5)
WBC: 15.9 K/uL — ABNORMAL HIGH (ref 4.0–10.5)
nRBC: 0 % (ref 0.0–0.2)

## 2024-12-17 LAB — PATHOLOGIST SMEAR REVIEW

## 2024-12-17 LAB — LIPASE, BLOOD: Lipase: 26 U/L (ref 11–51)

## 2024-12-17 LAB — PHOSPHORUS: Phosphorus: 3.7 mg/dL (ref 2.5–4.6)

## 2024-12-17 LAB — PROTIME-INR
INR: 2.1 — ABNORMAL HIGH (ref 0.8–1.2)
Prothrombin Time: 24.4 s — ABNORMAL HIGH (ref 11.4–15.2)

## 2024-12-17 LAB — GLUCOSE, CAPILLARY: Glucose-Capillary: 92 mg/dL (ref 70–99)

## 2024-12-17 LAB — TSH: TSH: 2.37 u[IU]/mL (ref 0.350–4.500)

## 2024-12-17 LAB — LACTIC ACID, PLASMA: Lactic Acid, Venous: 0.8 mmol/L (ref 0.5–1.9)

## 2024-12-17 LAB — PROCALCITONIN: Procalcitonin: <0.1 ng/mL

## 2024-12-17 LAB — TROPONIN T, HIGH SENSITIVITY: Troponin T High Sensitivity: 35 ng/L — ABNORMAL HIGH (ref 0–19)

## 2024-12-17 LAB — MAGNESIUM: Magnesium: 2.3 mg/dL (ref 1.7–2.4)

## 2024-12-17 MED ORDER — HEPARIN (PORCINE) IN NACL 1000-0.9 UT/500ML-% IV SOLN
INTRAVENOUS | Status: DC | PRN
  Administered 2024-12-17: 16:00:00 500 mL via SURGICAL_CAVITY

## 2024-12-17 MED ORDER — LIDOCAINE HCL (PF) 1 % IJ SOLN
INTRAMUSCULAR | Status: AC
  Filled 2024-12-17: qty 30

## 2024-12-17 MED ORDER — CALCIUM GLUCONATE-NACL 1-0.675 GM/50ML-% IV SOLN
1.0000 g | Freq: Once | INTRAVENOUS | Status: AC
  Administered 2024-12-17: 13:00:00 1000 mg via INTRAVENOUS
  Filled 2024-12-17: qty 50

## 2024-12-17 MED ORDER — FUROSEMIDE 10 MG/ML IJ SOLN
40.0000 mg | Freq: Once | INTRAMUSCULAR | Status: AC
  Administered 2024-12-17: 15:00:00 40 mg via INTRAVENOUS
  Filled 2024-12-17: qty 4

## 2024-12-17 MED ORDER — LATANOPROST 0.005 % OP SOLN
1.0000 [drp] | Freq: Every day | OPHTHALMIC | Status: DC
  Administered 2024-12-18 – 2024-12-19 (×3): 1 [drp] via OPHTHALMIC
  Filled 2024-12-17: qty 2.5

## 2024-12-17 MED ORDER — CHLORHEXIDINE GLUCONATE CLOTH 2 % EX PADS
6.0000 | MEDICATED_PAD | Freq: Every day | CUTANEOUS | Status: DC
  Administered 2024-12-17 – 2024-12-20 (×4): 6 via TOPICAL

## 2024-12-17 MED ORDER — SODIUM CHLORIDE 0.9 % IV SOLN
INTRAVENOUS | Status: AC | PRN
  Administered 2024-12-17: 16:00:00 10 mL/h via INTRAVENOUS

## 2024-12-17 MED ORDER — LIDOCAINE HCL (PF) 1 % IJ SOLN
INTRAMUSCULAR | Status: DC | PRN
  Administered 2024-12-17: 16:00:00 10 mL

## 2024-12-17 NOTE — H&P (View-Only) (Signed)
 Cardiology Consultation   Patient ID: Jared Danese, PhD MRN: [id number]; DOB: [birth year]  Admit date: 12/16/2024 Date of Consult: 12/17/2024  PCP:  [name], MD   [facility] Providers Cardiologist:  Maude Emmer, MD      Patient Profile: Jared Mannan, PhD is a 89 y.o. male with a hx of hypertension, COPD, history of CVA, atrial fibrillation who is being seen 12/17/2024 for the evaluation of heart failure, afib, and bradycardia at the request of Anmar Al-Sultani.  History of Present Illness: Mr. Jared Neal is a 89 year old male with prior cardiac history listed below.  Was initially diagnosed with A-fib on 01/2020.  Atrial fibrillation appears to have be asymptomatic. was started on Eliquis  and had a cardioversion on 05/2020.  Heart rates tend to be bradycardic when patient is in sinus rhythm.  Since has gone back into A-fib and rate control strategy has been adopted.   TTE on 07/2020 showed a normal LVEF of 65 to 70%, moderate LVH, basal septal hypertrophy, normal RV systolic function, mildly dilated left and right atrium, and borderline aortic root dilation measuring 38 mm.  The patient was last seen for outpatient follow-up on 11/2024.  He has chronic T wave changes on the lateral leads of his EKG.  He denied any anginal symptoms and was remaining active so no stress testing was done.  The patient is a retired warden/ranger.  The patient was hospitalized on 12/16/2024 for weakness and confusion.  The patient was found to have hypothermia.  Urology was consulted and a Foley catheter was placed.  The patient did have gross hematuria following this procedure.  Because of this the Eliquis  was held.  The patient is alert and orientated.  The patients daughter and partner were present for the interview.  The patient and his family reported that he came to the hospital because of concerns for him not being able to urinate overnight.  They reported that he has had increasing DOE,  weakness, and fatigue since April of this year.  He has also become increasingly unsteady on his feet.  Prior to this he was able to walk 2 miles a day but is no longer able to do this.  He can still walk around his house.  Patient stated that he does get lightheaded when he stands up.  Denies lower extremity edema, abdominal distention, chest pain.   Labs showed TSH within normal limits, normal a.m. cortisol, potassium of 4.9, creatinine of 1.13, magnesium  of 2.3, leukocytosis of 15.9, normocytic anemia of 10.7, elevated proBNP of 2863, and elevated high-sensitivity troponins 28 > 35.  Chest CT showed scattered multivessel coronary atherosclerosis, scattered aortic atherosclerosis, moderate bilateral pleural effusions with right greater than left, and small to moderate pericardial effusion.  Brain MRI showed no acute abnormalities.  EKG showed atrial fibrillation with a rate of 59, low voltage, prolonged QTc with a QTcB of 471.  CT abdomen and pelvis with contrast showed duodenal wall thickening, cystic structure at pancreas head, possible cirrhosis, chronic diverticulosis, aortic atherosclerosis, possible focal dissection in infrarenal abdominal aorta.  Past Medical History:  Diagnosis Date   Acute, but ill-defined, cerebrovascular disease 04/07/2014   Adenomatous colon polyp    Allergy    Atrial fibrillation (HCC)    BPH (benign prostatic hyperplasia)    Cataract    removed both eyes    Community acquired pneumonia 03/05/2022   COPD (chronic obstructive pulmonary disease) (HCC)    Eczema    Erectile dysfunction    Gallstones  GERD (gastroesophageal reflux disease)    Heart murmur    HOH (hard of hearing)    HTN (hypertension)    Meningioma (HCC)    Right frontal   Meralgia paraesthetica    Palpitations    Urinary retention     Past Surgical History:  Procedure Laterality Date   BLADDER DIVERTICULECTOMY  02/15/2003   thelbert 05/16/2011   CARDIAC CATHETERIZATION      CARDIOVERSION N/A 06/17/2020   Procedure: CARDIOVERSION;  Surgeon: Delford [name], MD;  Location: [facility] ENDOSCOPY;  Service: Cardiovascular;  Laterality: N/A;   CATARACT EXTRACTION Bilateral 2013   CHOLECYSTECTOMY N/A 11/19/2014   Procedure: LAPAROSCOPIC CHOLECYSTECTOMY WITH INTRAOPERATIVE CHOLANGIOGRAM;  Surgeon: Krystal Russell, MD;  Location: [facility] OR;  Service: General;  Laterality: N/A;   COLONOSCOPY     COLONOSCOPY W/ POLYPECTOMY     GUM SURGERY  1999   INGUINAL HERNIA REPAIR Left    INGUINAL HERNIA REPAIR Right 11/2009   LAPAROSCOPIC CHOLECYSTECTOMY  11/19/2014   w/IOC   MEATOTOMY  06/2003   Distal urethral meatotomy with YAG laser/notes 05/16/2011   POLYPECTOMY     SUPRAPUBIC PROSTATECTOMY  02/15/2003   thelbert 05/16/2011     Home Medications:  Prior to Admission medications  Medication Sig Start Date End Date Taking? Authorizing Provider  acetaminophen  (TYLENOL ) 500 MG tablet Take 500 mg by mouth every 6 (six) hours as needed for mild pain, moderate pain or fever.   Yes [provider]  amLODipine  (NORVASC ) 10 MG tablet Take 10 mg by mouth daily.   Yes [provider]  ammonium lactate (LAC-HYDRIN) 12 % lotion Apply 1 Application topically at bedtime. Apply to feet   Yes [provider]  ciclopirox (LOPROX) 0.77 % cream Apply 1 application topically daily. 01/20/20  Yes [provider]  dorzolamide -timolol  (COSOPT ) 22.3-6.8 MG/ML ophthalmic solution Place 1 drop into the left eye 2 (two) times daily. 11/21/21  Yes [provider]  ELIQUIS  5 MG TABS tablet TAKE 1 TABLET(5 MG) BY MOUTH TWICE DAILY 07/07/24  Yes Nishan, Peter C, MD  esomeprazole  (NEXIUM ) 20 MG capsule Take 20 mg by mouth daily. 12/21/21  Yes [provider]  ketotifen  (ALAWAY ) 0.035 % ophthalmic solution Place 1 drop into both eyes daily.   Yes [provider]  latanoprost  (XALATAN ) 0.005 % ophthalmic solution Place 1 drop into both eyes at bedtime. 12/14/19  Yes  [provider]  melatonin (MELATONIN MAXIMUM STRENGTH) 5 MG TABS Take 1 tablet (5 mg total) by mouth every evening. Patient taking differently: Take 5 mg by mouth daily as needed (sleep). 11/07/23  Yes   terazosin  (HYTRIN ) 10 MG capsule Take 10 mg by mouth at bedtime.  11/21/12  Yes [provider]  zinc oxide (ENDIT) 20 % ointment Apply 1 Application topically at bedtime. Apply I application to bottom and right hip to diaper rash   Yes [provider]  donepezil  (ARICEPT ) 10 MG tablet Take 10 mg by mouth at bedtime. 11/23/24   [provider]    Scheduled Meds:  dorzolamide -timolol   1 drop Left Eye BID   pantoprazole   40 mg Oral Daily   Continuous Infusions:  calcium  gluconate     lactated ringers  75 mL/hr at 12/17/24 1010   meropenem  (MERREM ) IV Stopped (12/17/24 0002)   PRN Meds: acetaminophen  **OR** acetaminophen , melatonin, polyethylene glycol  Allergies:   Allergies[1]  Social History:   Social History   Socioeconomic History   Marital status: Divorced    Spouse name:  Not on file   Number of children: 2   Years of education: Ph. D   Highest education level: Not on file  Occupational History   Occupation: Retired-psychologist  Tobacco Use   Smoking status: Former    Current packs/day: 0.00    Average packs/day: 0.2 packs/day for 40.0 years (8.0 ttl pk-yrs)    Types: Cigarettes    Start date: 11/12/1959    Quit date: 11/12/1999    Years since quitting: 25.1   Smokeless tobacco: Never  Vaping Use   Vaping status: Never Used  Substance and Sexual Activity   Alcohol use: Not Currently    Comment: occ   Drug use: No   Sexual activity: Not Currently  Other Topics Concern   Not on file  Social History Narrative   Not on file   Social Drivers of Health   Tobacco Use: Medium Risk (12/16/2024)   Patient History    Smoking Tobacco Use: Former    Smokeless Tobacco Use: Never    Passive Exposure: Not on Actuary  Strain: Not on file  Food Insecurity: Not on file  Transportation Needs: Not on file  Physical Activity: Not on file  Stress: Not on file  Social Connections: Not on file  Intimate Partner Violence: Not on file  Depression (PHQ2-9): Not on file  Alcohol Screen: Not on file  Housing: Not on file  Utilities: Not on file  Health Literacy: Not on file    Family History:    Family History  Problem Relation Age of Onset   Alzheimer's disease Mother    Heart attack Father    Cancer - Lung Sister    Colon cancer Neg Hx    Colon polyps Neg Hx    Esophageal cancer Neg Hx    Rectal cancer Neg Hx    Stomach cancer Neg Hx      ROS:  Please see the history of present illness.   All other ROS reviewed and negative.     Physical Exam/Data: Vitals:   12/17/24 0600 12/17/24 0645 12/17/24 0900 12/17/24 1000  BP: (!) 131/58  138/68 108/62  Pulse: (!) 58 61 (!) 59   Resp: (!) 31 (!) 25 (!) 31   Temp: 97.8 F (36.6 C) (!) 97 F (36.1 C)    TempSrc:  Rectal    SpO2: 91% 92% 91%   Weight:      Height:        Intake/Output Summary (Last 24 hours) at 12/17/2024 1148 Last data filed at 12/17/2024 0629 Gross per 24 hour  Intake 701.19 ml  Output 725 ml  Net -23.81 ml      12/16/2024    9:47 AM 12/10/2024    8:39 AM 06/08/2024    9:50 AM  Last 3 Weights  Weight (lbs) 146 lb 146 lb 141 lb  Weight (kg) 66.225 kg 66.225 kg 63.957 kg     Body mass index is 20.36 kg/m.  General:  Well nourished, well developed, in no acute distress.  Alert and orientated on room air.  Patient was tachypneic on exam. HEENT: normal Neck: Positive JVD Vascular: Difficult to hear carotids given increased upper breath sounds.  Distal pulses 2+ bilaterally Cardiac:  normal S1, S2; irregularly irregular rhythm; no murmur  Lungs: Slight bibasilar crackles. Abd: soft, nontender, no hepatomegaly  Ext: no edema Musculoskeletal:  No deformities. Skin: warm and dry  Neuro:  no focal abnormalities  noted Psych:  Normal affect   EKG:  The EKG was personally reviewed and demonstrates:  atrial fibrillation with a rate of 59, low voltage, prolonged QTc with a QTcB of 471. Telemetry:  Telemetry was personally reviewed and demonstrates:  Atrial fibrillation with heart rates in the 40-60's  Relevant CV Studies: Echo pending  Laboratory Data: High Sensitivity Troponin:  No results for input(s): TROPONINIHS in the last 720 hours.  Recent Labs  Lab 12/16/24 1320 12/17/24 0903  TRNPT 28* 35*      Chemistry Recent Labs  Lab 12/16/24 1020 12/16/24 1029 12/17/24 0025  NA 138 138  137 138  K 4.5 4.3  4.3 4.9  CL 105 105 105  CO2 25  --  25  GLUCOSE 151* 150* 115*  BUN 47* 44* 42*  CREATININE 1.14 1.20 1.13  CALCIUM  8.4*  --  8.7*  MG  --   --  2.3  GFRNONAA 60*  --  >60  ANIONGAP 8  --  9    Recent Labs  Lab 12/16/24 1020 12/17/24 0025  PROT 5.2* 5.3*  ALBUMIN 3.8 3.9  AST 26 25  ALT 28 29  ALKPHOS 73 78  BILITOT 0.9 1.1   Lipids No results for input(s): CHOL, TRIG, HDL, LABVLDL, LDLCALC, CHOLHDL in the last 168 hours.  Hematology Recent Labs  Lab 12/16/24 1020 12/16/24 1029 12/17/24 0025  WBC 8.5  --  15.9*  RBC 3.45*  --  3.52*  HGB 10.4* 10.2*  10.2* 10.7*  HCT 33.3* 30.0*  30.0* 33.8*  MCV 96.5  --  96.0  MCH 30.1  --  30.4  MCHC 31.2  --  31.7  RDW 14.4  --  14.6  PLT 59*  --  79*   Thyroid   Recent Labs  Lab 12/17/24 0025  TSH 2.370    BNP Recent Labs  Lab 12/16/24 1320  PROBNP 2,863.0*    DDimer No results for input(s): DDIMER in the last 168 hours.  Radiology/Studies:  CT CHEST WO CONTRAST Result Date: 12/17/2024 CLINICAL DATA:  Shortness of breath (Ped 0-17y) Concern for aspiration pneumonia EXAM: CT CHEST WITHOUT CONTRAST TECHNIQUE: Multidetector CT imaging of the chest was performed following the standard protocol without IV contrast. RADIATION DOSE REDUCTION: This exam was performed according to the departmental  dose-optimization program which includes automated exposure control, adjustment of the mA and/or kV according to patient size and/or use of iterative reconstruction technique. COMPARISON:  December 16, 2024 FINDINGS: Cardiovascular: Mild cardiomegaly. Small to moderate volume pericardial effusion. Scattered multi-vessel coronary atherosclerosis. No aortic aneurysm. Scattered aortic atherosclerosis. Mediastinum/Nodes: No mediastinal mass.Mildly enlarged multistation mediastinal lymph nodes measuring up to 1.1 cm, likely reactive. No axillary or hilar lymphadenopathy. Lungs/Pleura: The midline trachea and bronchi are patent. Moderate volume bilateral pleural effusions, larger on the right than the left with compressive atelectasis in both upper and lower lobes. No pneumothorax. Minimal intralobular septal thickening noted predominantly in the upper lobes bilaterally. Musculoskeletal: No acute fracture or destructive bone lesion. Moderate joint space loss of both shoulders, likely degenerative. Loose bodies in the left shoulder joint. Multilevel degenerative disc disease of the spine. Upper Abdomen: No acute abnormality in the partially visualized upper abdomen. Similar appearing splenomegaly. IMPRESSION: 1. Cardiomegaly with minimal intralobular septal thickening noted in the lungs, possibly reflecting early interstitial edema. Moderate volume bilateral pleural effusions, larger on the right than the left, with compressive atelectasis in the lungs. 2. Mildly enlarged multistation mediastinal lymph nodes, measuring up to 1.1 cm, likely reactive. 3. Small to moderate volume pericardial effusion. Aortic  Atherosclerosis (ICD10-I70.0). Electronically Signed   By: Rogelia Myers M.D.   On: 12/17/2024 10:56   MR BRAIN WO CONTRAST Result Date: 12/16/2024 EXAM: MRI BRAIN WITHOUT CONTRAST 12/16/2024 04:57:39 PM TECHNIQUE: Multiplanar multisequence MRI of the head/brain was performed without the administration of  intravenous contrast. COMPARISON: Head CT 12/16/2024 and MRI 12/13/2024. CLINICAL HISTORY: Neuro deficit, acute, stroke suspected; Transient ischemic attack (TIA). Worsening weakness and slowed speech. FINDINGS: LIMITATIONS: The examination is mildly motion degraded. BRAIN AND VENTRICLES: No acute infarct, midline shift, extra-axial fluid collection, or hydrocephalus is identified. A small focus of chronic hemorrhage is again noted in the left temporo-occipital region along the medial margin of the atrium of the left lateral ventricle. Scattered small T2 hyperintensities in the cerebral white matter bilaterally are unchanged and nonspecific but compatible with mild chronic small vessel ischemic disease. There is a small chronic right cerebellar infarct. There is mild cerebral atrophy. A 1.7 cm extra-axial mass in the left middle cranial fossa and smaller extra-axial masses along the anterior falx and right frontoparietal convexity are all unchanged from the recent prior MRI and are without significant associated mass effect or brain edema. Major intracranial vascular flow voids are preserved. ORBITS: Bilateral cataract extraction. SINUSES AND MASTOIDS: Trace left mastoid fluid. Clear paranasal sinuses. BONES AND SOFT TISSUES: Normal marrow signal. Unchanged 2.2 x 1.1 cm superficial cystic focus in the left preauricular region. IMPRESSION: 1. No acute intracranial abnormality. 2. 4 unchanged extra-axial masses compatible with meningiomas. No significant mass effect or brain edema. 3. Mild chronic small vessel ischemic disease. 4. Small chronic right cerebellar infarct. Electronically signed by: Dasie Hamburg MD 12/16/2024 05:23 PM EST RP Workstation: [id number]   DG Chest Portable 1 View Result Date: 12/16/2024 CLINICAL DATA:  Pleural effusions versus infection. Weakness 4 days. EXAM: PORTABLE CHEST 1 VIEW COMPARISON:  10/25/2024 FINDINGS: Lungs are adequately inflated demonstrate hazy bilateral perihilar and  bibasilar opacification likely mild interstitial edema with possible small layering effusions. Infection is less likely. Cardiomediastinal silhouette and remainder of the exam is unchanged. IMPRESSION: Hazy bilateral perihilar and bibasilar opacification likely mild interstitial edema with possible small layering effusions. Infection is less likely. Electronically Signed   By: Toribio Agreste M.D.   On: 12/16/2024 13:57   CT ABDOMEN PELVIS W CONTRAST Result Date: 12/16/2024 CLINICAL DATA:  Abdominal pain and gross hematuria with abdominal distension. EXAM: CT ABDOMEN AND PELVIS WITH CONTRAST TECHNIQUE: Multidetector CT imaging of the abdomen and pelvis was performed using the standard protocol following bolus administration of intravenous contrast. RADIATION DOSE REDUCTION: This exam was performed according to the departmental dose-optimization program which includes automated exposure control, adjustment of the mA and/or kV according to patient size and/or use of iterative reconstruction technique. CONTRAST:  75mL OMNIPAQUE  IOHEXOL  350 MG/ML SOLN COMPARISON:  07/07/2020 FINDINGS: Lower chest: Borderline cardiomegaly. Moderate size hiatal hernia unchanged. Lung bases demonstrate bilateral moderate size pleural effusions with associated bibasilar airspace density which may be atelectasis versus infection. Hepatobiliary: Prior cholecystectomy. Mild diffuse low-attenuation of the liver compatible with a degree of steatosis or possible cirrhosis as there is minimal nodular contour to the liver. No focal liver mass. Biliary tree is unremarkable. Pancreas: 2.5 cm simple cystic structure over the head of the pancreas which is new. Somewhat ill-defined 1.3 cm low-attenuation over the more distal aspect of the pancreatic head unchanged. Fatty atrophy of the pancreas. Spleen: Mild stable splenomegaly. Adrenals/Urinary Tract: Adrenal glands are normal. Kidneys are normal in size. No evidence of nephrolithiasis. Bilateral  renal cysts unchanged.  Likely parapelvic left renal cysts without significant change. Visualized ureters and bladder are unremarkable. Stomach/Bowel: Stomach is within normal. There is mild stranding of the fat anterior to the body of the stomach. Mild wall thickening of the first and second portion of the duodenal C sweep with adjacent free fluid. Findings may be due to duodenitis/peptic ulcer disease. Appendix not well visualized. Diverticulosis over the distal descending and sigmoid colon. No evidence of active inflammation. Vascular/Lymphatic: Calcified plaque over the abdominal aorta. Possible focal dissection of the infrarenal abdominal aorta as the aorta is otherwise normal in caliber. No adenopathy. Reproductive: Stable enlargement of the prostate with impression upon the bladder base. Other: Mild-to-moderate free fluid over the pelvis which is new. Minimal free fluid over the pericolic gutters. Musculoskeletal: No focal abnormality. Degenerative changes of the spine and hips. IMPRESSION: 1. Mild wall thickening of the first and second portion of the duodenal C sweep with adjacent free fluid. Findings may be due to duodenitis/peptic ulcer disease. 2. New 2.5 cm simple cystic structure over the head of the pancreas. This may represent a pseudocyst or or cyst and less likely low-grade cystic neoplasm. Stable 1.3 cm low-attenuation over the more distal aspect of the pancreatic head. Recommend follow-up CT 2 years. 3. Mild diffuse low-attenuation of the liver compatible with a degree of steatosis or possible cirrhosis. Mild stable splenomegaly. Minimal ascites. 4. Colonic diverticulosis without active inflammation. 5. Table enlargement of the prostate with impression upon the bladder base. 6. Moderate size bilateral pleural effusions with associated bibasilar airspace density which may be atelectasis versus infection. 7. Aortic atherosclerosis. Possible focal dissection of the infrarenal abdominal aorta as the  aorta is otherwise normal in caliber. Aortic Atherosclerosis (ICD10-I70.0). Electronically Signed   By: Toribio Agreste M.D.   On: 12/16/2024 12:51   CT Head Wo Contrast Result Date: 12/16/2024 EXAM: CT HEAD WITHOUT CONTRAST 12/16/2024 10:40:49 AM TECHNIQUE: CT of the head was performed without the administration of intravenous contrast. Automated exposure control, iterative reconstruction, and/or weight based adjustment of the mA/kV was utilized to reduce the radiation dose to as low as reasonably achievable. COMPARISON: MRI of the head dated 12/13/2024. CLINICAL HISTORY: Mental status change, unknown cause. FINDINGS: BRAIN AND VENTRICLES: No acute hemorrhage. No evidence of acute infarct. No hydrocephalus. No extra-axial collection. No mass effect or midline shift. Moderate chronic microvascular ischemic change sequela. Calcified meningioma along right frontal convexity. ORBITS: Bilateral lens replacement. SINUSES: No acute abnormality. SOFT TISSUES AND SKULL: No acute soft tissue abnormality. No skull fracture. IMPRESSION: 1. No acute intracranial abnormality. 2. Moderate chronic microvascular ischemic change sequela. 3. Calcified meningioma along right frontal convexity. 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   Possible heart failure Pericardial effusion Tachypnea Since April the patient has had worsening weakness and fatigue.  Patient reported some possible shortness of breath.  Chest CT today showed moderate bilateral pleural effusions with right greater than left, possible early interstitial edema. and small to moderate pericardial effusion. Labs showed elevated proBNP of 2863. Patient was started on IV  lactated Ringer 's.  It appears like these were recently stopped. Echo pending. May consider 20mg  IV lasix    Atrial fibrillation Bradycardia Atrial fibrillation is asymptomatic.  Patient denies any chest  pain or palpitations.  Denies lightheadedness or syncope.  Stated that he does get lightheaded when standing up.   Reviewing notes from Dr. Delford it appears like the patient has a longstanding history of bradycardia. Reported that the last dose of donezepil was on Monday 12/14/2024.  The patient also takes timolol  eyedrops. Hold Eliquis  due to hematuria.  May restart when cleared to do so by urology. Avoid AV nodal agents   HTN Blood pressure is overall stable. Hold amlodipine  10 mg daily.   Elevated troponins 28 > 35. Suspect is secondary to demand ischemia possibly due to sepsis.   QT prolongation EKG showed atrial fibrillation with a rate of 59, low voltage, prolonged QTc with a QTcB of 471. Avoid QT prolonging agents.   Otherwise management per primary    Risk Assessment/Risk Scores:     New [place]  Heart Association (NYHA) Functional Class NYHA Class IV  CHA2DS2-VASc Score = 6  :1} This indicates a 9.7% annual risk of stroke. The patient's score is based upon: CHF History: 0 HTN History: 1 Diabetes History: 0 Stroke History: 2 Vascular Disease History: 1 Age Score: 2 Gender Score: 0        For questions or updates, please contact [facility] Please consult [email or web address] for contact info under     Signed, Morse Clause, PA-C  12/17/2024 11:48 AM     [1]  Allergies Allergen Reactions   Aspirin     GI Bleeding Other reaction(s): upset stomach   Bee Venom     Other reaction(s): Unknown   Tamsulosin     Other reaction(s): increased frequency   Nifedipine Other (See Comments)    Headaches  Other reaction(s): headaches

## 2024-12-17 NOTE — Progress Notes (Signed)
 eLink Physician-Brief Progress Note Patient Name: Jared Hirt, PhD DOB: Feb 22, 1929 MRN: [id number]   Date of Service  12/17/2024  HPI/Events of Note  Patient admitted wit suspected sepsis of urinary tract origin but developed symptomatic bradycardia and is s/p temporary transvenous pacemaker insertion.  eICU Interventions  New Patient Evaluation.        Sharnell Knight U Curtiss Mahmood 12/17/2024, 8:43 PM

## 2024-12-17 NOTE — Interval H&P Note (Signed)
 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  The various methods of treatment have been discussed with the patient and family. After consideration of risks, benefits and other options for treatment, the patient has consented to  Procedures: TEMPORARY PACEMAKER (N/A) as a surgical intervention.  The patient's history has been reviewed, patient examined, no change in status, stable for surgery.  I have reviewed the patient's chart and labs.  Questions were answered to the patient's satisfaction.     Ozell Fell

## 2024-12-17 NOTE — Progress Notes (Signed)
° °  Brief Progress Note   _____________________________________________________________________________________________________________  Patient Name: Jared Mannan, PhD Patient DOB: 05-19-1929 Date: @TODAY @      Data: Reviewed vital signs, labs, and clinical notes.    Action: No action required at this time.    Response: Cards consulted.  _____________________________________________________________________________________________________________  [facility] RN Expeditor Elianny Buxbaum S Janellie Tennison Please contact us  directly via secure chat (search for [facility]) or by calling us  at [phone] [facility]).

## 2024-12-17 NOTE — Consult Note (Signed)
 NAME:  Jared Hockenbury, PhD, MRN:  [id number], DOB:  [birth year], LOS: 1 ADMISSION DATE:  12/16/2024, CONSULTATION DATE:  12/17/2024 REFERRING MD:  Aloha Daria Bitter, MD, CHIEF COMPLAINT:  Symptomatic Bradycardia   History of Present Illness:  This 89 year old gentleman is admitted to the hospitalist service.  He has a history of hypertension CVA atrial fibrillation, COPD, mild dementia, thrombocytopenia, BPH presents to the emergency room with progressive weakness confusion.  CT abdomen pelvis did not show anything acute.  Patient was having a difficult time with urinary obstruction.  He has hematuria.  Urology has seen him and exchanged his Foley and irrigating it.  Patient went into bradycardia with significant low heart rates.  Cardiology has seen him.  He is taken to Cath Lab for pacemaker placement.  ICU is asked to admit this patient after he pacemaker placement to monitor hemodynamics closely.  Pertinent  Medical History   Past Medical History:  Diagnosis Date   Acute, but ill-defined, cerebrovascular disease 04/07/2014   Adenomatous colon polyp    Allergy    Atrial fibrillation (HCC)    BPH (benign prostatic hyperplasia)    Cataract    removed both eyes    Community acquired pneumonia 03/05/2022   COPD (chronic obstructive pulmonary disease) (HCC)    Eczema    Erectile dysfunction    Gallstones    GERD (gastroesophageal reflux disease)    Heart murmur    HOH (hard of hearing)    HTN (hypertension)    Meningioma (HCC)    Right frontal   Meralgia paraesthetica    Palpitations    Urinary retention    Significant Hospital Events: Including procedures, antibiotic start and stop dates in addition to other pertinent events   12/17/2024 - Pacemaker placed.  Interim History / Subjective:  Called to see patient on the floor for symptomatic bradycardia.  Patient is being urgently taken down to Cath Lab for pacemaker placement.  Objective    Blood pressure 117/69, pulse (!)  33, temperature (!) 96 F (35.6 C), temperature source Rectal, resp. rate (!) 31, height 5' 11 (1.803 m), weight 66.2 kg, SpO2 92%.        Intake/Output Summary (Last 24 hours) at 12/17/2024 1617 Last data filed at 12/17/2024 9370 Gross per 24 hour  Intake 201.19 ml  Output 725 ml  Net -523.81 ml   Filed Weights   12/16/24 0947  Weight: 66.2 kg    Examination: General: acutely-ill elderly male lying in bed, in NAD HEENT: AT/[place], PERRL, 3mm bilaterally Pulm: mild hypoxia w/ SpO2 89; improved with O2 via [place], diminished breath sounds in bases CV: TPM; Paced at 60 bpm, GI: soft, non distended, non tender to palpation GU: Foley in situ; hematuria noted in urometer Neuro: A&O x3, no focal deficits   Resolved problem list   Assessment and Plan   Atrial Fibrillation on Eliquis  Symptomatic Bradycardia Pericardial effusion noted on CT small to moderate  - Cardiology following - Pacemaker on 12/17/2024; paced at 60bpm - Echo pending - Continue telemetry - Monitor hemodynamics - Avoid AV nodal blockers - Cont to hold Eliquis  in setting of ongoing hematuria  BPH Urinary bladder outlet obstruction Hematuria following traumatic foley placement  - Urology following - Continue bladder irrigation - Trend cbc - Eliquis  held  Hypothermia Sepsis History of ESBL Klebsiella pneumonia colonization  - On empiric meropenem  - ID is consulted by hospitalist team; appreciate recs  Bilateral pleural effusions Mediastinal lymphadenopathy  -Pleural effusions likely due to CHF  monitor for now - 40mg  Lasix  given per cards  History of mild dementia Acute metabolic encephalopathy -Home donepezil  held in setting of bradycardia  Chronic Anemia Acute on Chronic Thrombocytopneia -Trend cbc with ongoing hematuria   Code Status: Full DVT Prophylaxis: held 2/2 hematuria GI ppx: Protonix   Labs   CBC: Recent Labs  Lab 12/16/24 1020 12/16/24 1029 12/17/24 0025  WBC 8.5  --   15.9*  NEUTROABS 1.4*  --   --   HGB 10.4* 10.2*  10.2* 10.7*  HCT 33.3* 30.0*  30.0* 33.8*  MCV 96.5  --  96.0  PLT 59*  --  79*    Basic Metabolic Panel: Recent Labs  Lab 12/16/24 1020 12/16/24 1029 12/17/24 0025 12/17/24 0903  NA 138 138  137 138  --   K 4.5 4.3  4.3 4.9  --   CL 105 105 105  --   CO2 25  --  25  --   GLUCOSE 151* 150* 115*  --   BUN 47* 44* 42*  --   CREATININE 1.14 1.20 1.13  --   CALCIUM  8.4*  --  8.7*  --   MG  --   --  2.3  --   PHOS  --   --   --  3.7   GFR: Estimated Creatinine Clearance: 38.2 mL/min (by C-G formula based on SCr of 1.13 mg/dL). Recent Labs  Lab 12/16/24 1007 12/16/24 1020 12/16/24 1242 12/17/24 0025 12/17/24 0903 12/17/24 1323  PROCALCITON  --   --   --   --  <0.10  --   WBC  --  8.5  --  15.9*  --   --   LATICACIDVEN 0.4*  --  0.3*  --   --  0.8    Liver Function Tests: Recent Labs  Lab 12/16/24 1020 12/17/24 0025  AST 26 25  ALT 28 29  ALKPHOS 73 78  BILITOT 0.9 1.1  PROT 5.2* 5.3*  ALBUMIN 3.8 3.9   Recent Labs  Lab 12/17/24 0025  LIPASE 26   No results for input(s): AMMONIA in the last 168 hours.  ABG    Component Value Date/Time   HCO3 23.1 12/16/2024 1029   TCO2 24 12/16/2024 1029   TCO2 22 12/16/2024 1029   ACIDBASEDEF 2.0 12/16/2024 1029   O2SAT 99 12/16/2024 1029     Coagulation Profile: Recent Labs  Lab 12/16/24 1020 12/17/24 0903  INR 2.1* 2.1*    Cardiac Enzymes: No results for input(s): CKTOTAL, CKMB, CKMBINDEX, TROPONINI in the last 168 hours.  HbA1C: No results found for: HGBA1C  CBG: No results for input(s): GLUCAP in the last 168 hours.  Review of Systems:   12 point review of systems is done which is negative for anything else other than what is mentioned in H & P.  Past Medical History:  He,  has a past medical history of Acute, but ill-defined, cerebrovascular disease (04/07/2014), Adenomatous colon polyp, Allergy, Atrial fibrillation (HCC), BPH  (benign prostatic hyperplasia), Cataract, Community acquired pneumonia (03/05/2022), COPD (chronic obstructive pulmonary disease) (HCC), Eczema, Erectile dysfunction, Gallstones, GERD (gastroesophageal reflux disease), Heart murmur, HOH (hard of hearing), HTN (hypertension), Meningioma (HCC), Meralgia paraesthetica, Palpitations, and Urinary retention.   Surgical History:   Past Surgical History:  Procedure Laterality Date   BLADDER DIVERTICULECTOMY  02/15/2003   thelbert 05/16/2011   CARDIAC CATHETERIZATION     CARDIOVERSION N/A 06/17/2020   Procedure: CARDIOVERSION;  Surgeon: Delford [name], MD;  Location: [facility] ENDOSCOPY;  Service: Cardiovascular;  Laterality: N/A;   CATARACT EXTRACTION Bilateral 2013   CHOLECYSTECTOMY N/A 11/19/2014   Procedure: LAPAROSCOPIC CHOLECYSTECTOMY WITH INTRAOPERATIVE CHOLANGIOGRAM;  Surgeon: Krystal Russell, MD;  Location[facility];  Service: General;  Laterality: N/A;   COLONOSCOPY     COLONOSCOPY W/ POLYPECTOMY     GUM SURGERY  1999   INGUINAL HERNIA REPAIR Left    INGUINAL HERNIA REPAIR Right 11/2009   LAPAROSCOPIC CHOLECYSTECTOMY  11/19/2014   w/IOC   MEATOTOMY  06/2003   Distal urethral meatotomy with YAG laser/notes 05/16/2011   POLYPECTOMY     SUPRAPUBIC PROSTATECTOMY  02/15/2003   thelbert 05/16/2011     Social History:   reports that he quit smoking about 25 years ago. His smoking use included cigarettes. He started smoking about 65 years ago. He has a 8 pack-year smoking history. He has never used smokeless tobacco. He reports that he does not currently use alcohol. He reports that he does not use drugs.   Family History:  His family history includes Alzheimer's disease in his mother; Cancer - Lung in his sister; Heart attack in his father. There is no history of Colon cancer, Colon polyps, Esophageal cancer, Rectal cancer, or Stomach cancer.   Allergies Allergies[1]   Home Medications  Prior to Admission medications  Medication Sig Start Date End Date  Taking? Authorizing Provider  acetaminophen  (TYLENOL ) 500 MG tablet Take 500 mg by mouth every 6 (six) hours as needed for mild pain, moderate pain or fever.   Yes [provider]  amLODipine  (NORVASC ) 10 MG tablet Take 10 mg by mouth daily.   Yes [provider]  ammonium lactate (LAC-HYDRIN) 12 % lotion Apply 1 Application topically at bedtime. Apply to feet   Yes [provider]  ciclopirox (LOPROX) 0.77 % cream Apply 1 application topically daily. 01/20/20  Yes [provider]  dorzolamide -timolol  (COSOPT ) 22.3-6.8 MG/ML ophthalmic solution Place 1 drop into the left eye 2 (two) times daily. 11/21/21  Yes [provider]  ELIQUIS  5 MG TABS tablet TAKE 1 TABLET(5 MG) BY MOUTH TWICE DAILY 07/07/24  Yes Nishan, Peter C, MD  esomeprazole  (NEXIUM ) 20 MG capsule Take 20 mg by mouth daily. 12/21/21  Yes [provider]  ketotifen  (ALAWAY ) 0.035 % ophthalmic solution Place 1 drop into both eyes daily.   Yes [provider]  latanoprost  (XALATAN ) 0.005 % ophthalmic solution Place 1 drop into both eyes at bedtime. 12/14/19  Yes [provider]  melatonin (MELATONIN MAXIMUM STRENGTH) 5 MG TABS Take 1 tablet (5 mg total) by mouth every evening. Patient taking differently: Take 5 mg by mouth daily as needed (sleep). 11/07/23  Yes   terazosin  (HYTRIN ) 10 MG capsule Take 10 mg by mouth at bedtime.  11/21/12  Yes [provider]  zinc oxide (ENDIT) 20 % ointment Apply 1 Application topically at bedtime. Apply I application to bottom and right hip to diaper rash   Yes [provider]  donepezil  (ARICEPT ) 10 MG tablet Take 10 mg by mouth at bedtime. 11/23/24   [provider]     Critical care time: 35 minutes.   Telvin Reinders, DNP, AGACNP-BC [facility]  Please see [email or web address] for pager details.  From 7A-7P if no response, please call [phone]. After hours, please call ELink  [phone].     [1]  Allergies Allergen Reactions   Aspirin     GI Bleeding Other reaction(s): upset stomach   Bee Venom  Other reaction(s): Unknown   Tamsulosin     Other reaction(s): increased frequency   Nifedipine Other (See Comments)    Headaches  Other reaction(s): headaches

## 2024-12-17 NOTE — Consult Note (Addendum)
 Cardiology Consultation   Patient ID: Jared Danese, PhD MRN: [id number]; DOB: [birth year]  Admit date: 12/16/2024 Date of Consult: 12/17/2024  PCP:  [name], MD   [facility] Providers Cardiologist:  Maude Emmer, MD      Patient Profile: Jared Mannan, PhD is a 89 y.o. male with a hx of hypertension, COPD, history of CVA, atrial fibrillation who is being seen 12/17/2024 for the evaluation of heart failure, afib, and bradycardia at the request of Anmar Al-Sultani.  History of Present Illness: Jared Neal is a 89 year old male with prior cardiac history listed below.  Was initially diagnosed with A-fib on 01/2020.  Atrial fibrillation appears to have be asymptomatic. was started on Eliquis  and had a cardioversion on 05/2020.  Heart rates tend to be bradycardic when patient is in sinus rhythm.  Since has gone back into A-fib and rate control strategy has been adopted.   TTE on 07/2020 showed a normal LVEF of 65 to 70%, moderate LVH, basal septal hypertrophy, normal RV systolic function, mildly dilated left and right atrium, and borderline aortic root dilation measuring 38 mm.  The patient was last seen for outpatient follow-up on 11/2024.  He has chronic T wave changes on the lateral leads of his EKG.  He denied any anginal symptoms and was remaining active so no stress testing was done.  The patient is a retired warden/ranger.  The patient was hospitalized on 12/16/2024 for weakness and confusion.  The patient was found to have hypothermia.  Urology was consulted and a Foley catheter was placed.  The patient did have gross hematuria following this procedure.  Because of this the Eliquis  was held.  The patient is alert and orientated.  The patients daughter and partner were present for the interview.  The patient and his family reported that he came to the hospital because of concerns for him not being able to urinate overnight.  They reported that he has had increasing DOE,  weakness, and fatigue since April of this year.  He has also become increasingly unsteady on his feet.  Prior to this he was able to walk 2 miles a day but is no longer able to do this.  He can still walk around his house.  Patient stated that he does get lightheaded when he stands up.  Denies lower extremity edema, abdominal distention, chest pain.   Labs showed TSH within normal limits, normal a.m. cortisol, potassium of 4.9, creatinine of 1.13, magnesium  of 2.3, leukocytosis of 15.9, normocytic anemia of 10.7, elevated proBNP of 2863, and elevated high-sensitivity troponins 28 > 35.  Chest CT showed scattered multivessel coronary atherosclerosis, scattered aortic atherosclerosis, moderate bilateral pleural effusions with right greater than left, and small to moderate pericardial effusion.  Brain MRI showed no acute abnormalities.  EKG showed atrial fibrillation with a rate of 59, low voltage, prolonged QTc with a QTcB of 471.  CT abdomen and pelvis with contrast showed duodenal wall thickening, cystic structure at pancreas head, possible cirrhosis, chronic diverticulosis, aortic atherosclerosis, possible focal dissection in infrarenal abdominal aorta.  Past Medical History:  Diagnosis Date   Acute, but ill-defined, cerebrovascular disease 04/07/2014   Adenomatous colon polyp    Allergy    Atrial fibrillation (HCC)    BPH (benign prostatic hyperplasia)    Cataract    removed both eyes    Community acquired pneumonia 03/05/2022   COPD (chronic obstructive pulmonary disease) (HCC)    Eczema    Erectile dysfunction    Gallstones  GERD (gastroesophageal reflux disease)    Heart murmur    HOH (hard of hearing)    HTN (hypertension)    Meningioma (HCC)    Right frontal   Meralgia paraesthetica    Palpitations    Urinary retention     Past Surgical History:  Procedure Laterality Date   BLADDER DIVERTICULECTOMY  02/15/2003   thelbert 05/16/2011   CARDIAC CATHETERIZATION      CARDIOVERSION N/A 06/17/2020   Procedure: CARDIOVERSION;  Surgeon: Delford [name], MD;  Location: [facility] ENDOSCOPY;  Service: Cardiovascular;  Laterality: N/A;   CATARACT EXTRACTION Bilateral 2013   CHOLECYSTECTOMY N/A 11/19/2014   Procedure: LAPAROSCOPIC CHOLECYSTECTOMY WITH INTRAOPERATIVE CHOLANGIOGRAM;  Surgeon: Krystal Russell, MD;  Location: [facility] OR;  Service: General;  Laterality: N/A;   COLONOSCOPY     COLONOSCOPY W/ POLYPECTOMY     GUM SURGERY  1999   INGUINAL HERNIA REPAIR Left    INGUINAL HERNIA REPAIR Right 11/2009   LAPAROSCOPIC CHOLECYSTECTOMY  11/19/2014   w/IOC   MEATOTOMY  06/2003   Distal urethral meatotomy with YAG laser/notes 05/16/2011   POLYPECTOMY     SUPRAPUBIC PROSTATECTOMY  02/15/2003   thelbert 05/16/2011     Home Medications:  Prior to Admission medications  Medication Sig Start Date End Date Taking? Authorizing Provider  acetaminophen  (TYLENOL ) 500 MG tablet Take 500 mg by mouth every 6 (six) hours as needed for mild pain, moderate pain or fever.   Yes [provider]  amLODipine  (NORVASC ) 10 MG tablet Take 10 mg by mouth daily.   Yes [provider]  ammonium lactate (LAC-HYDRIN) 12 % lotion Apply 1 Application topically at bedtime. Apply to feet   Yes [provider]  ciclopirox (LOPROX) 0.77 % cream Apply 1 application topically daily. 01/20/20  Yes [provider]  dorzolamide -timolol  (COSOPT ) 22.3-6.8 MG/ML ophthalmic solution Place 1 drop into the left eye 2 (two) times daily. 11/21/21  Yes [provider]  ELIQUIS  5 MG TABS tablet TAKE 1 TABLET(5 MG) BY MOUTH TWICE DAILY 07/07/24  Yes Nishan, Peter C, MD  esomeprazole  (NEXIUM ) 20 MG capsule Take 20 mg by mouth daily. 12/21/21  Yes [provider]  ketotifen  (ALAWAY ) 0.035 % ophthalmic solution Place 1 drop into both eyes daily.   Yes [provider]  latanoprost  (XALATAN ) 0.005 % ophthalmic solution Place 1 drop into both eyes at bedtime. 12/14/19  Yes  [provider]  melatonin (MELATONIN MAXIMUM STRENGTH) 5 MG TABS Take 1 tablet (5 mg total) by mouth every evening. Patient taking differently: Take 5 mg by mouth daily as needed (sleep). 11/07/23  Yes   terazosin  (HYTRIN ) 10 MG capsule Take 10 mg by mouth at bedtime.  11/21/12  Yes [provider]  zinc oxide (ENDIT) 20 % ointment Apply 1 Application topically at bedtime. Apply I application to bottom and right hip to diaper rash   Yes [provider]  donepezil  (ARICEPT ) 10 MG tablet Take 10 mg by mouth at bedtime. 11/23/24   [provider]    Scheduled Meds:  dorzolamide -timolol   1 drop Left Eye BID   pantoprazole   40 mg Oral Daily   Continuous Infusions:  calcium  gluconate     lactated ringers  75 mL/hr at 12/17/24 1010   meropenem  (MERREM ) IV Stopped (12/17/24 0002)   PRN Meds: acetaminophen  **OR** acetaminophen , melatonin, polyethylene glycol  Allergies:   Allergies[1]  Social History:   Social History   Socioeconomic History   Marital status: Divorced    Spouse name:  Not on file   Number of children: 2   Years of education: Ph. D   Highest education level: Not on file  Occupational History   Occupation: Retired-psychologist  Tobacco Use   Smoking status: Former    Current packs/day: 0.00    Average packs/day: 0.2 packs/day for 40.0 years (8.0 ttl pk-yrs)    Types: Cigarettes    Start date: 11/12/1959    Quit date: 11/12/1999    Years since quitting: 25.1   Smokeless tobacco: Never  Vaping Use   Vaping status: Never Used  Substance and Sexual Activity   Alcohol use: Not Currently    Comment: occ   Drug use: No   Sexual activity: Not Currently  Other Topics Concern   Not on file  Social History Narrative   Not on file   Social Drivers of Health   Tobacco Use: Medium Risk (12/16/2024)   Patient History    Smoking Tobacco Use: Former    Smokeless Tobacco Use: Never    Passive Exposure: Not on Actuary  Strain: Not on file  Food Insecurity: Not on file  Transportation Needs: Not on file  Physical Activity: Not on file  Stress: Not on file  Social Connections: Not on file  Intimate Partner Violence: Not on file  Depression (PHQ2-9): Not on file  Alcohol Screen: Not on file  Housing: Not on file  Utilities: Not on file  Health Literacy: Not on file    Family History:    Family History  Problem Relation Age of Onset   Alzheimer's disease Mother    Heart attack Father    Cancer - Lung Sister    Colon cancer Neg Hx    Colon polyps Neg Hx    Esophageal cancer Neg Hx    Rectal cancer Neg Hx    Stomach cancer Neg Hx      ROS:  Please see the history of present illness.   All other ROS reviewed and negative.     Physical Exam/Data: Vitals:   12/17/24 0600 12/17/24 0645 12/17/24 0900 12/17/24 1000  BP: (!) 131/58  138/68 108/62  Pulse: (!) 58 61 (!) 59   Resp: (!) 31 (!) 25 (!) 31   Temp: 97.8 F (36.6 C) (!) 97 F (36.1 C)    TempSrc:  Rectal    SpO2: 91% 92% 91%   Weight:      Height:        Intake/Output Summary (Last 24 hours) at 12/17/2024 1148 Last data filed at 12/17/2024 0629 Gross per 24 hour  Intake 701.19 ml  Output 725 ml  Net -23.81 ml      12/16/2024    9:47 AM 12/10/2024    8:39 AM 06/08/2024    9:50 AM  Last 3 Weights  Weight (lbs) 146 lb 146 lb 141 lb  Weight (kg) 66.225 kg 66.225 kg 63.957 kg     Body mass index is 20.36 kg/m.  General:  Well nourished, well developed, in no acute distress.  Alert and orientated on room air.  Patient was tachypneic on exam. HEENT: normal Neck: Positive JVD Vascular: Difficult to hear carotids given increased upper breath sounds.  Distal pulses 2+ bilaterally Cardiac:  normal S1, S2; irregularly irregular rhythm; no murmur  Lungs: Slight bibasilar crackles. Abd: soft, nontender, no hepatomegaly  Ext: no edema Musculoskeletal:  No deformities. Skin: warm and dry  Neuro:  no focal abnormalities  noted Psych:  Normal affect   EKG:  The EKG was personally reviewed and demonstrates:  atrial fibrillation with a rate of 59, low voltage, prolonged QTc with a QTcB of 471. Telemetry:  Telemetry was personally reviewed and demonstrates:  Atrial fibrillation with heart rates in the 40-60's  Relevant CV Studies: Echo pending  Laboratory Data: High Sensitivity Troponin:  No results for input(s): TROPONINIHS in the last 720 hours.  Recent Labs  Lab 12/16/24 1320 12/17/24 0903  TRNPT 28* 35*      Chemistry Recent Labs  Lab 12/16/24 1020 12/16/24 1029 12/17/24 0025  NA 138 138  137 138  K 4.5 4.3  4.3 4.9  CL 105 105 105  CO2 25  --  25  GLUCOSE 151* 150* 115*  BUN 47* 44* 42*  CREATININE 1.14 1.20 1.13  CALCIUM  8.4*  --  8.7*  MG  --   --  2.3  GFRNONAA 60*  --  >60  ANIONGAP 8  --  9    Recent Labs  Lab 12/16/24 1020 12/17/24 0025  PROT 5.2* 5.3*  ALBUMIN 3.8 3.9  AST 26 25  ALT 28 29  ALKPHOS 73 78  BILITOT 0.9 1.1   Lipids No results for input(s): CHOL, TRIG, HDL, LABVLDL, LDLCALC, CHOLHDL in the last 168 hours.  Hematology Recent Labs  Lab 12/16/24 1020 12/16/24 1029 12/17/24 0025  WBC 8.5  --  15.9*  RBC 3.45*  --  3.52*  HGB 10.4* 10.2*  10.2* 10.7*  HCT 33.3* 30.0*  30.0* 33.8*  MCV 96.5  --  96.0  MCH 30.1  --  30.4  MCHC 31.2  --  31.7  RDW 14.4  --  14.6  PLT 59*  --  79*   Thyroid   Recent Labs  Lab 12/17/24 0025  TSH 2.370    BNP Recent Labs  Lab 12/16/24 1320  PROBNP 2,863.0*    DDimer No results for input(s): DDIMER in the last 168 hours.  Radiology/Studies:  CT CHEST WO CONTRAST Result Date: 12/17/2024 CLINICAL DATA:  Shortness of breath (Ped 0-17y) Concern for aspiration pneumonia EXAM: CT CHEST WITHOUT CONTRAST TECHNIQUE: Multidetector CT imaging of the chest was performed following the standard protocol without IV contrast. RADIATION DOSE REDUCTION: This exam was performed according to the departmental  dose-optimization program which includes automated exposure control, adjustment of the mA and/or kV according to patient size and/or use of iterative reconstruction technique. COMPARISON:  December 16, 2024 FINDINGS: Cardiovascular: Mild cardiomegaly. Small to moderate volume pericardial effusion. Scattered multi-vessel coronary atherosclerosis. No aortic aneurysm. Scattered aortic atherosclerosis. Mediastinum/Nodes: No mediastinal mass.Mildly enlarged multistation mediastinal lymph nodes measuring up to 1.1 cm, likely reactive. No axillary or hilar lymphadenopathy. Lungs/Pleura: The midline trachea and bronchi are patent. Moderate volume bilateral pleural effusions, larger on the right than the left with compressive atelectasis in both upper and lower lobes. No pneumothorax. Minimal intralobular septal thickening noted predominantly in the upper lobes bilaterally. Musculoskeletal: No acute fracture or destructive bone lesion. Moderate joint space loss of both shoulders, likely degenerative. Loose bodies in the left shoulder joint. Multilevel degenerative disc disease of the spine. Upper Abdomen: No acute abnormality in the partially visualized upper abdomen. Similar appearing splenomegaly. IMPRESSION: 1. Cardiomegaly with minimal intralobular septal thickening noted in the lungs, possibly reflecting early interstitial edema. Moderate volume bilateral pleural effusions, larger on the right than the left, with compressive atelectasis in the lungs. 2. Mildly enlarged multistation mediastinal lymph nodes, measuring up to 1.1 cm, likely reactive. 3. Small to moderate volume pericardial effusion. Aortic  Atherosclerosis (ICD10-I70.0). Electronically Signed   By: Rogelia Myers M.D.   On: 12/17/2024 10:56   MR BRAIN WO CONTRAST Result Date: 12/16/2024 EXAM: MRI BRAIN WITHOUT CONTRAST 12/16/2024 04:57:39 PM TECHNIQUE: Multiplanar multisequence MRI of the head/brain was performed without the administration of  intravenous contrast. COMPARISON: Head CT 12/16/2024 and MRI 12/13/2024. CLINICAL HISTORY: Neuro deficit, acute, stroke suspected; Transient ischemic attack (TIA). Worsening weakness and slowed speech. FINDINGS: LIMITATIONS: The examination is mildly motion degraded. BRAIN AND VENTRICLES: No acute infarct, midline shift, extra-axial fluid collection, or hydrocephalus is identified. A small focus of chronic hemorrhage is again noted in the left temporo-occipital region along the medial margin of the atrium of the left lateral ventricle. Scattered small T2 hyperintensities in the cerebral white matter bilaterally are unchanged and nonspecific but compatible with mild chronic small vessel ischemic disease. There is a small chronic right cerebellar infarct. There is mild cerebral atrophy. A 1.7 cm extra-axial mass in the left middle cranial fossa and smaller extra-axial masses along the anterior falx and right frontoparietal convexity are all unchanged from the recent prior MRI and are without significant associated mass effect or brain edema. Major intracranial vascular flow voids are preserved. ORBITS: Bilateral cataract extraction. SINUSES AND MASTOIDS: Trace left mastoid fluid. Clear paranasal sinuses. BONES AND SOFT TISSUES: Normal marrow signal. Unchanged 2.2 x 1.1 cm superficial cystic focus in the left preauricular region. IMPRESSION: 1. No acute intracranial abnormality. 2. 4 unchanged extra-axial masses compatible with meningiomas. No significant mass effect or brain edema. 3. Mild chronic small vessel ischemic disease. 4. Small chronic right cerebellar infarct. Electronically signed by: Dasie Hamburg MD 12/16/2024 05:23 PM EST RP Workstation: [id number]   DG Chest Portable 1 View Result Date: 12/16/2024 CLINICAL DATA:  Pleural effusions versus infection. Weakness 4 days. EXAM: PORTABLE CHEST 1 VIEW COMPARISON:  10/25/2024 FINDINGS: Lungs are adequately inflated demonstrate hazy bilateral perihilar and  bibasilar opacification likely mild interstitial edema with possible small layering effusions. Infection is less likely. Cardiomediastinal silhouette and remainder of the exam is unchanged. IMPRESSION: Hazy bilateral perihilar and bibasilar opacification likely mild interstitial edema with possible small layering effusions. Infection is less likely. Electronically Signed   By: Toribio Agreste M.D.   On: 12/16/2024 13:57   CT ABDOMEN PELVIS W CONTRAST Result Date: 12/16/2024 CLINICAL DATA:  Abdominal pain and gross hematuria with abdominal distension. EXAM: CT ABDOMEN AND PELVIS WITH CONTRAST TECHNIQUE: Multidetector CT imaging of the abdomen and pelvis was performed using the standard protocol following bolus administration of intravenous contrast. RADIATION DOSE REDUCTION: This exam was performed according to the departmental dose-optimization program which includes automated exposure control, adjustment of the mA and/or kV according to patient size and/or use of iterative reconstruction technique. CONTRAST:  75mL OMNIPAQUE  IOHEXOL  350 MG/ML SOLN COMPARISON:  07/07/2020 FINDINGS: Lower chest: Borderline cardiomegaly. Moderate size hiatal hernia unchanged. Lung bases demonstrate bilateral moderate size pleural effusions with associated bibasilar airspace density which may be atelectasis versus infection. Hepatobiliary: Prior cholecystectomy. Mild diffuse low-attenuation of the liver compatible with a degree of steatosis or possible cirrhosis as there is minimal nodular contour to the liver. No focal liver mass. Biliary tree is unremarkable. Pancreas: 2.5 cm simple cystic structure over the head of the pancreas which is new. Somewhat ill-defined 1.3 cm low-attenuation over the more distal aspect of the pancreatic head unchanged. Fatty atrophy of the pancreas. Spleen: Mild stable splenomegaly. Adrenals/Urinary Tract: Adrenal glands are normal. Kidneys are normal in size. No evidence of nephrolithiasis. Bilateral  renal cysts unchanged.  Likely parapelvic left renal cysts without significant change. Visualized ureters and bladder are unremarkable. Stomach/Bowel: Stomach is within normal. There is mild stranding of the fat anterior to the body of the stomach. Mild wall thickening of the first and second portion of the duodenal C sweep with adjacent free fluid. Findings may be due to duodenitis/peptic ulcer disease. Appendix not well visualized. Diverticulosis over the distal descending and sigmoid colon. No evidence of active inflammation. Vascular/Lymphatic: Calcified plaque over the abdominal aorta. Possible focal dissection of the infrarenal abdominal aorta as the aorta is otherwise normal in caliber. No adenopathy. Reproductive: Stable enlargement of the prostate with impression upon the bladder base. Other: Mild-to-moderate free fluid over the pelvis which is new. Minimal free fluid over the pericolic gutters. Musculoskeletal: No focal abnormality. Degenerative changes of the spine and hips. IMPRESSION: 1. Mild wall thickening of the first and second portion of the duodenal C sweep with adjacent free fluid. Findings may be due to duodenitis/peptic ulcer disease. 2. New 2.5 cm simple cystic structure over the head of the pancreas. This may represent a pseudocyst or or cyst and less likely low-grade cystic neoplasm. Stable 1.3 cm low-attenuation over the more distal aspect of the pancreatic head. Recommend follow-up CT 2 years. 3. Mild diffuse low-attenuation of the liver compatible with a degree of steatosis or possible cirrhosis. Mild stable splenomegaly. Minimal ascites. 4. Colonic diverticulosis without active inflammation. 5. Table enlargement of the prostate with impression upon the bladder base. 6. Moderate size bilateral pleural effusions with associated bibasilar airspace density which may be atelectasis versus infection. 7. Aortic atherosclerosis. Possible focal dissection of the infrarenal abdominal aorta as the  aorta is otherwise normal in caliber. Aortic Atherosclerosis (ICD10-I70.0). Electronically Signed   By: Toribio Agreste M.D.   On: 12/16/2024 12:51   CT Head Wo Contrast Result Date: 12/16/2024 EXAM: CT HEAD WITHOUT CONTRAST 12/16/2024 10:40:49 AM TECHNIQUE: CT of the head was performed without the administration of intravenous contrast. Automated exposure control, iterative reconstruction, and/or weight based adjustment of the mA/kV was utilized to reduce the radiation dose to as low as reasonably achievable. COMPARISON: MRI of the head dated 12/13/2024. CLINICAL HISTORY: Mental status change, unknown cause. FINDINGS: BRAIN AND VENTRICLES: No acute hemorrhage. No evidence of acute infarct. No hydrocephalus. No extra-axial collection. No mass effect or midline shift. Moderate chronic microvascular ischemic change sequela. Calcified meningioma along right frontal convexity. ORBITS: Bilateral lens replacement. SINUSES: No acute abnormality. SOFT TISSUES AND SKULL: No acute soft tissue abnormality. No skull fracture. IMPRESSION: 1. No acute intracranial abnormality. 2. Moderate chronic microvascular ischemic change sequela. 3. Calcified meningioma along right frontal convexity. 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   Possible heart failure Pericardial effusion Tachypnea Since April the patient has had worsening weakness and fatigue.  Patient reported some possible shortness of breath.  Chest CT today showed moderate bilateral pleural effusions with right greater than left, possible early interstitial edema. and small to moderate pericardial effusion. Labs showed elevated proBNP of 2863. Patient was started on IV  lactated Ringer 's.  It appears like these were recently stopped. Echo pending. May consider 20mg  IV lasix    Atrial fibrillation Bradycardia Atrial fibrillation is asymptomatic.  Patient denies any chest  pain or palpitations.  Denies lightheadedness or syncope.  Stated that he does get lightheaded when standing up.   Reviewing notes from Dr. Delford it appears like the patient has a longstanding history of bradycardia. Reported that the last dose of donezepil was on Monday 12/14/2024.  The patient also takes timolol  eyedrops. Hold Eliquis  due to hematuria.  May restart when cleared to do so by urology. Avoid AV nodal agents   HTN Blood pressure is overall stable. Hold amlodipine  10 mg daily.   Elevated troponins 28 > 35. Suspect is secondary to demand ischemia possibly due to sepsis.   QT prolongation EKG showed atrial fibrillation with a rate of 59, low voltage, prolonged QTc with a QTcB of 471. Avoid QT prolonging agents.   Otherwise management per primary    Risk Assessment/Risk Scores:     [place]  Heart Association (NYHA) Functional Class NYHA Class IV  CHA2DS2-VASc Score = 6  :1} This indicates a 9.7% annual risk of stroke. The patient's score is based upon: CHF History: 0 HTN History: 1 Diabetes History: 0 Stroke History: 2 Vascular Disease History: 1 Age Score: 2 Gender Score: 0        For questions or updates, please contact [facility] Please consult [email or web address] for contact info under     Signed, Morse Clause, PA-C  12/17/2024 11:48 AM     [1]  Allergies Allergen Reactions   Aspirin     GI Bleeding Other reaction(s): upset stomach   Bee Venom     Other reaction(s): Unknown   Tamsulosin     Other reaction(s): increased frequency   Nifedipine Other (See Comments)    Headaches  Other reaction(s): headaches

## 2024-12-17 NOTE — TOC Initial Note (Signed)
 Transition of Care [facility]) - Initial/Assessment Note    Patient Details  Name: Jared Windhorst, PhD MRN: [id number] Date of Birth: 28-Dec-1929  [facility]) CM/SW Contact:    Arlana JINNY Nicholaus [place]RA[place] Phone Number: [phone] 12/17/2024, 4:11 PM  Clinical Narrative:   HF CSW met with patient, spouse, and daughter at bedside. Patient appeared lethargic. Patients wife stated that they live together alone. Patients wife stated that he has not history of HH services. Patients wife stated that he uses a cane. Patient stated that he has a PCP. However, the family does not want to schedule a follow up with the current PCP and will be looking for a new one if the patient agrees. Patients wife stated that the family will provide transportation at dc.   HF CSW/CM will continue to follow and monitor for dc readiness.                       Patient Goals and CMS Choice            Expected Discharge Plan and Services                                              Prior Living Arrangements/Services                       Activities of Daily Living      Permission Sought/Granted                  Emotional Assessment              Admission diagnosis:  Dehydration [E86.0] Hypothermia [T68.XXXA] Thrombocytopenia [D69.6] Generalized weakness [R53.1] Acute urinary retention [R33.8] Paroxysmal A-fib (HCC) [I48.0] Hypothermia, initial encounter [T68.XXXA] Patient Active Problem List   Diagnosis Date Noted   Symptomatic bradycardia 12/17/2024   History of stroke without residual deficits 12/16/2024   Mild dementia (HCC) 12/16/2024   Occlusion and stenosis of bilateral carotid arteries 12/16/2024   Lower urinary tract symptoms due to benign prostatic hyperplasia 12/16/2024   Unstable balance 12/16/2024   Arteriosclerosis of both carotid arteries 12/16/2024   Hardening of the aorta (main artery of the heart) 12/16/2024   Thrombocytopenia 12/16/2024    Hypothermia 12/16/2024   Prolonged QT interval 12/16/2024   Elevated brain natriuretic peptide (BNP) level 12/16/2024   Hypoperfusion of brain with transient focal neurological signs and symptoms 12/16/2024   Sensorineural hearing loss, bilateral 07/10/2024   Impacted cerumen of both ears 07/10/2024   Hematuria 06/13/2024   Bacteriuria, asymptomatic 06/08/2024   Carrier of extended spectrum beta lactamase (ESBL) producing bacteria 06/08/2024   Paroxysmal A-fib (HCC) 03/06/2022   Dehydration 03/06/2022   Symptomatic cholelithiasis 11/19/2014   Acute, but ill-defined, cerebrovascular disease 04/07/2014   Murmur 12/16/2013   Abnormal ECG 12/15/2012   Palpitations    HTN (hypertension)    GERD (gastroesophageal reflux disease)    Urinary retention    Eczema    COPD (chronic obstructive pulmonary disease) (HCC)    Erectile dysfunction    Meralgia paraesthetica    PCP:  [name], MD Pharmacy:   [facility] [phone] [place], [place] - [address] AT [facility] OF [address] [place] [address] Phone: [phone] Fax: [phone]  [facility] #[id number] [place], [place] -  [address] AT [facility] OF [facility] [address] & [facility] [address] [place] [address] Phone: [phone] Fax: [phone]     Social Drivers of Health (SDOH) Social History: SDOH Screenings   Tobacco Use: Medium Risk (12/16/2024)   SDOH Interventions:     Readmission Risk Interventions     No data to display

## 2024-12-17 NOTE — ED Notes (Addendum)
 Per urology at bedside irrigate catheter about every two hours. If still bloody this afternoon pt may need CBI. Urology irrigated catheter. Urology bladder scanned patient.

## 2024-12-17 NOTE — ED Notes (Signed)
 Messaged Dr Sultani to see if eliquis  needs held.

## 2024-12-17 NOTE — Progress Notes (Signed)
 Subjective: Patient reportedly went into urinary retention overnight at which time an 45 French coud catheter was placed.  Not draining with 500 mL of urinary retention and balloon in prostate on arrival.  Scant bloody drainage in bag and patient reporting significant pain.  Significant other, nurse, and hospitalist all at bedside as well.  Objective: Vital signs in last 24 hours: Temp:  [90.3 F (32.4 C)-98 F (36.7 C)] 97 F (36.1 C) (12/18 0645) Pulse Rate:  [32-66] 61 (12/18 0645) Resp:  [14-31] 25 (12/18 0645) BP: (101-153)/(51-104) 131/58 (12/18 0600) SpO2:  [89 %-100 %] 92 % (12/18 0645) Weight:  [66.2 kg] 66.2 kg (12/17 0947)  Assessment/Plan: 89 year old male with Hx of suprapubic prostatectomy in 2012, regrowth of tissue with 88 g prostate noted by 2016.  Gross hematuria following traumatic Foley placement while on Eliquis .  # Gross hematuria # BPH # Chronic anticoagulation # Traumatic Foley placement  Checked on patient this morning.  Bladder scan of 500 mL with scant bloody output in bag.  Took down balloon and advanced catheter with immediate return of bloody urine.  Hand irrigated several 100 cc of sterile water.  Only around 10 to 20 mL of small clot material returned.  Primary team to hold Eliquis .  Hand irrigate Q2 and as needed.  If patient is still bleeding significantly midday will exchange his catheter for three-way and initiate CBI.  Significantly bradycardic while in the room, down to 18 bpm. Do not want to bearing-down for Foley exchange at this time.  Intake/Output from previous day: 12/17 0701 - 12/18 0700 In: 701.2 [IV Piggyback:701.2] Out: 725 [Urine:725]  Intake/Output this shift: No intake/output data recorded.  Physical Exam:  General: Alert and oriented CV: No cyanosis Lungs: equal chest rise Gu: 78f coud catheter in place draining bloody urine  Lab Results: Recent Labs    12/16/24 1020 12/16/24 1029 12/17/24 0025  HGB 10.4*  10.2*  10.2* 10.7*  HCT 33.3* 30.0*  30.0* 33.8*   BMET Recent Labs    12/16/24 1020 12/16/24 1029 12/17/24 0025  NA 138 138  137 138  K 4.5 4.3  4.3 4.9  CL 105 105 105  CO2 25  --  25  GLUCOSE 151* 150* 115*  BUN 47* 44* 42*  CREATININE 1.14 1.20 1.13  CALCIUM  8.4*  --  8.7*  HGB 10.4* 10.2*  10.2* 10.7*  WBC 8.5  --  15.9*     Studies/Results: MR BRAIN WO CONTRAST Result Date: 12/16/2024 EXAM: MRI BRAIN WITHOUT CONTRAST 12/16/2024 04:57:39 PM TECHNIQUE: Multiplanar multisequence MRI of the head/brain was performed without the administration of intravenous contrast. COMPARISON: Head CT 12/16/2024 and MRI 12/13/2024. CLINICAL HISTORY: Neuro deficit, acute, stroke suspected; Transient ischemic attack (TIA). Worsening weakness and slowed speech. FINDINGS: LIMITATIONS: The examination is mildly motion degraded. BRAIN AND VENTRICLES: No acute infarct, midline shift, extra-axial fluid collection, or hydrocephalus is identified. A small focus of chronic hemorrhage is again noted in the left temporo-occipital region along the medial margin of the atrium of the left lateral ventricle. Scattered small T2 hyperintensities in the cerebral white matter bilaterally are unchanged and nonspecific but compatible with mild chronic small vessel ischemic disease. There is a small chronic right cerebellar infarct. There is mild cerebral atrophy. A 1.7 cm extra-axial mass in the left middle cranial fossa and smaller extra-axial masses along the anterior falx and right frontoparietal convexity are all unchanged from the recent prior MRI and are without significant associated mass effect or  brain edema. Major intracranial vascular flow voids are preserved. ORBITS: Bilateral cataract extraction. SINUSES AND MASTOIDS: Trace left mastoid fluid. Clear paranasal sinuses. BONES AND SOFT TISSUES: Normal marrow signal. Unchanged 2.2 x 1.1 cm superficial cystic focus in the left preauricular region. IMPRESSION: 1. No  acute intracranial abnormality. 2. 4 unchanged extra-axial masses compatible with meningiomas. No significant mass effect or brain edema. 3. Mild chronic small vessel ischemic disease. 4. Small chronic right cerebellar infarct. Electronically signed by: Dasie Hamburg MD 12/16/2024 05:23 PM EST RP Workstation: [id number]   DG Chest Portable 1 View Result Date: 12/16/2024 CLINICAL DATA:  Pleural effusions versus infection. Weakness 4 days. EXAM: PORTABLE CHEST 1 VIEW COMPARISON:  10/25/2024 FINDINGS: Lungs are adequately inflated demonstrate hazy bilateral perihilar and bibasilar opacification likely mild interstitial edema with possible small layering effusions. Infection is less likely. Cardiomediastinal silhouette and remainder of the exam is unchanged. IMPRESSION: Hazy bilateral perihilar and bibasilar opacification likely mild interstitial edema with possible small layering effusions. Infection is less likely. Electronically Signed   By: Toribio Agreste M.D.   On: 12/16/2024 13:57   CT ABDOMEN PELVIS W CONTRAST Result Date: 12/16/2024 CLINICAL DATA:  Abdominal pain and gross hematuria with abdominal distension. EXAM: CT ABDOMEN AND PELVIS WITH CONTRAST TECHNIQUE: Multidetector CT imaging of the abdomen and pelvis was performed using the standard protocol following bolus administration of intravenous contrast. RADIATION DOSE REDUCTION: This exam was performed according to the departmental dose-optimization program which includes automated exposure control, adjustment of the mA and/or kV according to patient size and/or use of iterative reconstruction technique. CONTRAST:  75mL OMNIPAQUE  IOHEXOL  350 MG/ML SOLN COMPARISON:  07/07/2020 FINDINGS: Lower chest: Borderline cardiomegaly. Moderate size hiatal hernia unchanged. Lung bases demonstrate bilateral moderate size pleural effusions with associated bibasilar airspace density which may be atelectasis versus infection. Hepatobiliary: Prior cholecystectomy. Mild  diffuse low-attenuation of the liver compatible with a degree of steatosis or possible cirrhosis as there is minimal nodular contour to the liver. No focal liver mass. Biliary tree is unremarkable. Pancreas: 2.5 cm simple cystic structure over the head of the pancreas which is new. Somewhat ill-defined 1.3 cm low-attenuation over the more distal aspect of the pancreatic head unchanged. Fatty atrophy of the pancreas. Spleen: Mild stable splenomegaly. Adrenals/Urinary Tract: Adrenal glands are normal. Kidneys are normal in size. No evidence of nephrolithiasis. Bilateral renal cysts unchanged. Likely parapelvic left renal cysts without significant change. Visualized ureters and bladder are unremarkable. Stomach/Bowel: Stomach is within normal. There is mild stranding of the fat anterior to the body of the stomach. Mild wall thickening of the first and second portion of the duodenal C sweep with adjacent free fluid. Findings may be due to duodenitis/peptic ulcer disease. Appendix not well visualized. Diverticulosis over the distal descending and sigmoid colon. No evidence of active inflammation. Vascular/Lymphatic: Calcified plaque over the abdominal aorta. Possible focal dissection of the infrarenal abdominal aorta as the aorta is otherwise normal in caliber. No adenopathy. Reproductive: Stable enlargement of the prostate with impression upon the bladder base. Other: Mild-to-moderate free fluid over the pelvis which is new. Minimal free fluid over the pericolic gutters. Musculoskeletal: No focal abnormality. Degenerative changes of the spine and hips. IMPRESSION: 1. Mild wall thickening of the first and second portion of the duodenal C sweep with adjacent free fluid. Findings may be due to duodenitis/peptic ulcer disease. 2. New 2.5 cm simple cystic structure over the head of the pancreas. This may represent a pseudocyst or or cyst and less likely low-grade cystic neoplasm.  Stable 1.3 cm low-attenuation over the more  distal aspect of the pancreatic head. Recommend follow-up CT 2 years. 3. Mild diffuse low-attenuation of the liver compatible with a degree of steatosis or possible cirrhosis. Mild stable splenomegaly. Minimal ascites. 4. Colonic diverticulosis without active inflammation. 5. Table enlargement of the prostate with impression upon the bladder base. 6. Moderate size bilateral pleural effusions with associated bibasilar airspace density which may be atelectasis versus infection. 7. Aortic atherosclerosis. Possible focal dissection of the infrarenal abdominal aorta as the aorta is otherwise normal in caliber. Aortic Atherosclerosis (ICD10-I70.0). Electronically Signed   By: Toribio Agreste M.D.   On: 12/16/2024 12:51   CT Head Wo Contrast Result Date: 12/16/2024 EXAM: CT HEAD WITHOUT CONTRAST 12/16/2024 10:40:49 AM TECHNIQUE: CT of the head was performed without the administration of intravenous contrast. Automated exposure control, iterative reconstruction, and/or weight based adjustment of the mA/kV was utilized to reduce the radiation dose to as low as reasonably achievable. COMPARISON: MRI of the head dated 12/13/2024. CLINICAL HISTORY: Mental status change, unknown cause. FINDINGS: BRAIN AND VENTRICLES: No acute hemorrhage. No evidence of acute infarct. No hydrocephalus. No extra-axial collection. No mass effect or midline shift. Moderate chronic microvascular ischemic change sequela. Calcified meningioma along right frontal convexity. ORBITS: Bilateral lens replacement. SINUSES: No acute abnormality. SOFT TISSUES AND SKULL: No acute soft tissue abnormality. No skull fracture. IMPRESSION: 1. No acute intracranial abnormality. 2. Moderate chronic microvascular ischemic change sequela. 3. Calcified meningioma along right frontal convexity. Electronically signed by: Evalene Coho MD 12/16/2024 10:47 AM EST RP Workstation: [id number]      LOS: 1 day   Ole Bourdon, NP [facility] Pager: [phone]  12/17/2024, 9:43 AM

## 2024-12-17 NOTE — ED Notes (Signed)
 PT to ct

## 2024-12-17 NOTE — Plan of Care (Signed)
 Came to check on patient and replace Foley catheter with three-way hematuria catheter and CBI.  In phone conversation with nurse, bleeding was ongoing though no concern for clot obstruction.  On my arrival his daughter and cardiology were in the room.  He had not been hand irrigated but thankfully urine was clear medium red without clot material.There was concern on the part of cardiology as his heart rate was dipping into the low 20s/high teens that he may vagal down during catheter exchange, triggering a code event.  Shared decision was made to hand irrigate every 4 at a minimum out of an abundance of caution until he can have his pacemaker placed.  I expect that after his Eliquis  washes out his bleeding will stop without intervention.  I will see him again tomorrow

## 2024-12-17 NOTE — Progress Notes (Signed)
 Echocardiogram Patient in cath lab - will need to reschedule exam for tomorrow.  Jared Neal 12/17/2024, 4:43 PM

## 2024-12-17 NOTE — ED Notes (Signed)
 Dr. Sultani notified pt placed on 2L nasal cannula. Oxygen was reading 90%

## 2024-12-17 NOTE — Progress Notes (Signed)
 SLP Cancellation Note  Patient Details Name: Jared Wenberg, PhD MRN: [id number] DOB: 27-Jan-1929   Cancelled evaluation: Pt NPO for pacemaker procedure this am. Spoke with daughter in the room.  After procedure, unless showing overt s/s of aspiration, would allow sips of water and applesauce from floor stock. Hold PO if shows s/s of dysphagia.  SLP will f/u next date.  Leyah Bocchino L. Vona, MA CCC/SLP Clinical Specialist - Acute Care SLP Acute Rehabilitation Services Office number [phone]            Vona Palma Laurice 12/17/2024, 2:34 PM

## 2024-12-17 NOTE — Hospital Course (Signed)
 89 year old male with history of HTN, prior CVA, Afib, carotid artery disease, COPD, mild dementia, thrombocytopenia, BPH, ESBL colonization, and GERD who presented to the ED on 12/16/2024 with 4 days of progressive weakness, confusion, slowed speech, and intermittent slurred speech with at least one reported episode of left facial droop. Prior to admission, he was evaluated by his PCP for bradycardia and hypotension in the setting of Afib, and amlodipine  and donepezil  were discontinued. At baseline, patient ambulates with a cane, is alert and oriented x4, and independent with ADLs. EMS was called for worsening symptoms and found the patient bradycardic to the 20s. He received atropine with improvement of heart rate to the 70s and was transported to the ED.  In the ED, he was noted to be hypothermic (T 90.6F, improved with Bair Hugger), with heart rates ranging from 40s-60s and systolic blood pressures in the 100s.  On lab work, CBC showed no leukocytosis, Hgb 10.4, platelets 59.  CMP notable for BUN 47, creatinine 1.14, calcium  8.4.  PT elevated to 24.6, INR 2.1.  VBG unremarkable.  Blood cultures were drawn.  Ionized calcium  1.07.  Lactic acid WNL x 2.  proBNP elevated to 2863.  High sensitivity troponin 28.  UA showed red turbid urine limiting sample processing.  EKG showed Afib with SVR (59 bpm). CT head showed no acute intracranial process with chronic changes including a known meningioma. CT abdomen/pelvis demonstrated duodenal wall thickening concerning for duodenitis vs PUD, a cystic lesion at the pancreatic head (favored cyst vs pseudocyst; outpatient follow-up recommended in 2 years), hepatic steatosis vs cirrhosis, diverticulosis, BPH, aortic atherosclerosis with possible focal dissection, and pleural effusion with adjacent atelectasis vs infection. Patient received 500 cc IV fluids and placed on Bair hugger. Urology was consulted for difficult Foley placement and recommended continued hydration,  suspecting poor urine output due to hypoperfusion from bradycardia rather than urinary retention.

## 2024-12-17 NOTE — Progress Notes (Signed)
 PROGRESS NOTE    Marrio Scribner, PhD  FMW:[id number] DOB: [birth year] DOA: 12/16/2024 PCP: [name], MD    Brief Narrative:  89 year old male with history of HTN, prior CVA, Afib, carotid artery disease, COPD, mild dementia, thrombocytopenia, BPH, ESBL colonization, and GERD who presented to the ED on 12/16/2024 with 4 days of progressive weakness, confusion, slowed speech, and intermittent slurred speech with at least one reported episode of left facial droop. Prior to admission, he was evaluated by his PCP for bradycardia and hypotension in the setting of Afib, and amlodipine  and donepezil  were discontinued. At baseline, patient ambulates with a cane, is alert and oriented x4, and independent with ADLs. EMS was called for worsening symptoms and found the patient bradycardic to the 20s. He received atropine with improvement of heart rate to the 70s and was transported to the ED.  In the ED, he was noted to be hypothermic (T 90.97F, improved with Bair Hugger), with heart rates ranging from 40s-60s and systolic blood pressures in the 100s.  On lab work, CBC showed no leukocytosis, Hgb 10.4, platelets 59.  CMP notable for BUN 47, creatinine 1.14, calcium  8.4.  PT elevated to 24.6, INR 2.1.  VBG unremarkable.  Blood cultures were drawn.  Ionized calcium  1.07.  Lactic acid WNL x 2.  proBNP elevated to 2863.  High sensitivity troponin 28.  UA showed red turbid urine limiting sample processing.  EKG showed Afib with SVR (59 bpm). CT head showed no acute intracranial process with chronic changes including a known meningioma. CT abdomen/pelvis demonstrated duodenal wall thickening concerning for duodenitis vs PUD, a cystic lesion at the pancreatic head (favored cyst vs pseudocyst; outpatient follow-up recommended in 2 years), hepatic steatosis vs cirrhosis, diverticulosis, BPH, aortic atherosclerosis with possible focal dissection, and pleural effusion with adjacent atelectasis vs infection. Patient  received 500 cc IV fluids and placed on Bair hugger. Urology was consulted for difficult Foley placement and recommended continued hydration, suspecting poor urine output due to hypoperfusion from bradycardia rather than urinary retention.   Assessment and Plan:  Afib with SVR Bradycardia - Patient with known history of Afib with intermittent SVR and bradycardia, follows with outpatient cardiology, last seen on 12/10/2024 by Dr. Delford. Per review of note, plan was to stop Aricept  if HR < 50, which was ultimately stopped on 12/14/2024 - Eliquis  held given hematuria - Cardiology consulted given persistent bradycardia and frequent drops to 18-30s  - Continuous telemetry - Update: informed by Dr. Floretta of plans for placement of TVP given noted episodes of asystole on examination   Encephalopathy - improved Dementia Transient focal neurologic deficit - resolved - Per chart review of PCP note on 11/23/2024, patient with mild dementia with concerns for rapid progression, referred to [facility] at the time.  - Currently presenting with confusion in the setting of bradycardia and hypothermia. Also noted to have episodes of slurred speech and at least 1 episode of facial droop. - CT head negative for acute findings, stable chronic findings - MRI brain negative for acute ischemic stroke, showed small chronic right cerebellar infarct, unchanged extra-axial masses compatible with meningiomas without significant mass effect or brain edema, mild chronic small vessel ischemic disease - Currently awake but lethargic, oriented x 4. No dysarthria or facial droop.  Hypothermia - improving Leukocytosis Known history of ESBL Klebsiella pneumoniae colonization - Temp 90.3 F on arrival.  Unclear etiology, possible infectious (though no clear infectious etiology identified) versus low-flow state 2/2 bradycardia - TSH wnl. Cortisol level checked  at midnight but 22 effectively ruling out adrenal  insufficiency - Blood cultures NGTD - UA processing limited by blood, repeat UA ordered but unlikely to be of much yield given persistent hematuria as well as initiation of meropenem  - WBC 15.9 today - Procalcitonin < 0.1 - CT chest (12/18) showed small-moderate pericardial effusion, and mildly enlarged likely reactive mediastinal lymph nodes - Continue meropenem  for cardiomegaly with early interstitial pulmonary edema, moderate right greater than left bilateral pleural effusions with compressive atelectasis, now - ID consulted for further recommendations   Concern for CHF R > L bilateral pleural effusions Pericardial effusion - Presenting with generalized weakness and bradycardia, with proBNP elevated to 2863.  Right greater than left bilateral pleural effusions as well as early interstitial edema and small to moderate pericardial effusion noted on CT chest - Echo pending - Cardiology consulted, recommending IV Lasix  for diuresis  BPH Gross hematuria - Patient with gross hematuria this morning after likely traumatic Foley placement exacerbated by Eliquis  - Urology evaluated patient, noted 500 mL on bladder scan, scant bloody drainage in bag, with return of bloody urine after balloon deflation and catheter advancement.  Urology following with every 2 hours hand irrigation ordered and possible CBI initiation later today if continues to bleed - Eliquis  held  Concern for dysphagia - Patient was audible gurgling respirations consistent with retained upper airway secretions - Concern for possible dysphagia given inability to manage/clear secretions - SLP consulted for evaluation - RT consulted for assistance with airway clearance - NPO until cleared by SLP  Thrombocytopenia - Acute on chronic, 59K on presentation, noted 120K 2 years ago -  Improved to 79K today - Continue to monitor specially in light of   Hypocalcemia - 1g calcium  gluconate given  Abnormal CT findings  - CT  abd/pelvis with abnormal incidental findings including pancreatic head cystic structure with recommendation to follow-up in 2 years. (Cyst versus pseudocyst versus (less likely) cystic neoplasm), atherosclerosis with questionable focal dissection, and duodenal wall thickening representing likely duodenitis versus PUD. - PPI for duodenitis/PUD - Follow-up as indicated  GOC - Patient currently oriented x 4, states he wants full scope of treatment including   Mild dementia - Donepezil  held prior to arrival to the hospital given bradycardia  Debility Generalized weakness - PT/OT consulted for evaluation  DVT prophylaxis:  apixaban  (ELIQUIS ) tablet 5 mg   Code Status:   Code Status: Full Code  Family Communication: Discussed with patient's wife at bedside  Disposition Plan: Pending clinical improvement   Level of care: Progressive  Consultants:  Cardiology, infectious disease, palliative care  Procedures:  None  Antimicrobials: Meropenem    Subjective: Patient examined at bedside.  Patient's significant other was at bedside at the time of evaluation.  Was able to provide some history.  Patient alert and oriented x 4, complaining of some pain from Foley catheter which was being addressed by urology at same time.  Patient currently denying any chest pain, dizziness, lightheadedness, or presyncope.  Had multiple episodes of bradycardia down to the 20s to 30s and as low as 18 while being examined, reportedly asymptomatic throughout.  Discussed patient's wishes for CODE STATUS which he expressed as wanting to be full code.  Objective: Vitals:   12/17/24 0312 12/17/24 0400 12/17/24 0500 12/17/24 0600  BP:  125/63 (!) 153/104 (!) 131/58  Pulse:  (!) 53 (!) 32 (!) 58  Resp:  (!) 27 (!) 31 (!) 31  Temp:    97.8 F (36.6 C)  TempSrc:  SpO2: 98% 94% 94% 91%  Weight:      Height:        Intake/Output Summary (Last 24 hours) at 12/17/2024 9347 Last data filed at 12/17/2024  9370 Gross per 24 hour  Intake 701.19 ml  Output 725 ml  Net -23.81 ml   Filed Weights   12/16/24 0947  Weight: 66.2 kg    Examination:  Gen: NAD, A&Ox4 HEENT: NCAT, EOMI Neck: Supple, + JVD CV: Irregular rhythm, bradycardic Resp: Tachypneic, audible gurgling respirations, decreased breath sounds at bilateral bases Abd: Soft, NTND Ext: No LE edema Skin: Warm, dry Neuro: No focal deficits, generalized weakness Psych: Calm, cooperative, appropriate affect   Data Reviewed: I have personally reviewed following labs and imaging studies  CBC: Recent Labs  Lab 12/16/24 1020 12/16/24 1029 12/17/24 0025  WBC 8.5  --  15.9*  NEUTROABS 1.4*  --   --   HGB 10.4* 10.2*  10.2* 10.7*  HCT 33.3* 30.0*  30.0* 33.8*  MCV 96.5  --  96.0  PLT 59*  --  79*   Basic Metabolic Panel: Recent Labs  Lab 12/16/24 1020 12/16/24 1029 12/17/24 0025  NA 138 138  137 138  K 4.5 4.3  4.3 4.9  CL 105 105 105  CO2 25  --  25  GLUCOSE 151* 150* 115*  BUN 47* 44* 42*  CREATININE 1.14 1.20 1.13  CALCIUM  8.4*  --  8.7*  MG  --   --  2.3   GFR: Estimated Creatinine Clearance: 38.2 mL/min (by C-G formula based on SCr of 1.13 mg/dL). Liver Function Tests: Recent Labs  Lab 12/16/24 1020 12/17/24 0025  AST 26 25  ALT 28 29  ALKPHOS 73 78  BILITOT 0.9 1.1  PROT 5.2* 5.3*  ALBUMIN 3.8 3.9   Recent Labs  Lab 12/17/24 0025  LIPASE 26   No results for input(s): AMMONIA in the last 168 hours. Coagulation Profile: Recent Labs  Lab 12/16/24 1020  INR 2.1*   Cardiac Enzymes: No results for input(s): CKTOTAL, CKMB, CKMBINDEX, TROPONINI in the last 168 hours. BNP (last 3 results) Recent Labs    12/16/24 1320  PROBNP 2,863.0*   HbA1C: No results for input(s): HGBA1C in the last 72 hours. CBG: No results for input(s): GLUCAP in the last 168 hours. Lipid Profile: No results for input(s): CHOL, HDL, LDLCALC, TRIG, CHOLHDL, LDLDIRECT in the last 72  hours. Thyroid  Function Tests: Recent Labs    12/17/24 0025  TSH 2.370   Anemia Panel: No results for input(s): VITAMINB12, FOLATE, FERRITIN, TIBC, IRON, RETICCTPCT in the last 72 hours. Sepsis Labs: Recent Labs  Lab 12/16/24 1007 12/16/24 1242  LATICACIDVEN 0.4* 0.3*    No results found for this or any previous visit (from the past 240 hours).   Radiology Studies: MR BRAIN WO CONTRAST Result Date: 12/16/2024 EXAM: MRI BRAIN WITHOUT CONTRAST 12/16/2024 04:57:39 PM TECHNIQUE: Multiplanar multisequence MRI of the head/brain was performed without the administration of intravenous contrast. COMPARISON: Head CT 12/16/2024 and MRI 12/13/2024. CLINICAL HISTORY: Neuro deficit, acute, stroke suspected; Transient ischemic attack (TIA). Worsening weakness and slowed speech. FINDINGS: LIMITATIONS: The examination is mildly motion degraded. BRAIN AND VENTRICLES: No acute infarct, midline shift, extra-axial fluid collection, or hydrocephalus is identified. A small focus of chronic hemorrhage is again noted in the left temporo-occipital region along the medial margin of the atrium of the left lateral ventricle. Scattered small T2 hyperintensities in the cerebral white matter bilaterally are unchanged and nonspecific but compatible with  mild chronic small vessel ischemic disease. There is a small chronic right cerebellar infarct. There is mild cerebral atrophy. A 1.7 cm extra-axial mass in the left middle cranial fossa and smaller extra-axial masses along the anterior falx and right frontoparietal convexity are all unchanged from the recent prior MRI and are without significant associated mass effect or brain edema. Major intracranial vascular flow voids are preserved. ORBITS: Bilateral cataract extraction. SINUSES AND MASTOIDS: Trace left mastoid fluid. Clear paranasal sinuses. BONES AND SOFT TISSUES: Normal marrow signal. Unchanged 2.2 x 1.1 cm superficial cystic focus in the left preauricular  region. IMPRESSION: 1. No acute intracranial abnormality. 2. 4 unchanged extra-axial masses compatible with meningiomas. No significant mass effect or brain edema. 3. Mild chronic small vessel ischemic disease. 4. Small chronic right cerebellar infarct. Electronically signed by: Dasie Hamburg MD 12/16/2024 05:23 PM EST RP Workstation: [id number]   DG Chest Portable 1 View Result Date: 12/16/2024 CLINICAL DATA:  Pleural effusions versus infection. Weakness 4 days. EXAM: PORTABLE CHEST 1 VIEW COMPARISON:  10/25/2024 FINDINGS: Lungs are adequately inflated demonstrate hazy bilateral perihilar and bibasilar opacification likely mild interstitial edema with possible small layering effusions. Infection is less likely. Cardiomediastinal silhouette and remainder of the exam is unchanged. IMPRESSION: Hazy bilateral perihilar and bibasilar opacification likely mild interstitial edema with possible small layering effusions. Infection is less likely. Electronically Signed   By: Toribio Agreste M.D.   On: 12/16/2024 13:57   CT ABDOMEN PELVIS W CONTRAST Result Date: 12/16/2024 CLINICAL DATA:  Abdominal pain and gross hematuria with abdominal distension. EXAM: CT ABDOMEN AND PELVIS WITH CONTRAST TECHNIQUE: Multidetector CT imaging of the abdomen and pelvis was performed using the standard protocol following bolus administration of intravenous contrast. RADIATION DOSE REDUCTION: This exam was performed according to the departmental dose-optimization program which includes automated exposure control, adjustment of the mA and/or kV according to patient size and/or use of iterative reconstruction technique. CONTRAST:  75mL OMNIPAQUE  IOHEXOL  350 MG/ML SOLN COMPARISON:  07/07/2020 FINDINGS: Lower chest: Borderline cardiomegaly. Moderate size hiatal hernia unchanged. Lung bases demonstrate bilateral moderate size pleural effusions with associated bibasilar airspace density which may be atelectasis versus infection. Hepatobiliary:  Prior cholecystectomy. Mild diffuse low-attenuation of the liver compatible with a degree of steatosis or possible cirrhosis as there is minimal nodular contour to the liver. No focal liver mass. Biliary tree is unremarkable. Pancreas: 2.5 cm simple cystic structure over the head of the pancreas which is new. Somewhat ill-defined 1.3 cm low-attenuation over the more distal aspect of the pancreatic head unchanged. Fatty atrophy of the pancreas. Spleen: Mild stable splenomegaly. Adrenals/Urinary Tract: Adrenal glands are normal. Kidneys are normal in size. No evidence of nephrolithiasis. Bilateral renal cysts unchanged. Likely parapelvic left renal cysts without significant change. Visualized ureters and bladder are unremarkable. Stomach/Bowel: Stomach is within normal. There is mild stranding of the fat anterior to the body of the stomach. Mild wall thickening of the first and second portion of the duodenal C sweep with adjacent free fluid. Findings may be due to duodenitis/peptic ulcer disease. Appendix not well visualized. Diverticulosis over the distal descending and sigmoid colon. No evidence of active inflammation. Vascular/Lymphatic: Calcified plaque over the abdominal aorta. Possible focal dissection of the infrarenal abdominal aorta as the aorta is otherwise normal in caliber. No adenopathy. Reproductive: Stable enlargement of the prostate with impression upon the bladder base. Other: Mild-to-moderate free fluid over the pelvis which is new. Minimal free fluid over the pericolic gutters. Musculoskeletal: No focal abnormality. Degenerative changes of the  spine and hips. IMPRESSION: 1. Mild wall thickening of the first and second portion of the duodenal C sweep with adjacent free fluid. Findings may be due to duodenitis/peptic ulcer disease. 2. New 2.5 cm simple cystic structure over the head of the pancreas. This may represent a pseudocyst or or cyst and less likely low-grade cystic neoplasm. Stable 1.3 cm  low-attenuation over the more distal aspect of the pancreatic head. Recommend follow-up CT 2 years. 3. Mild diffuse low-attenuation of the liver compatible with a degree of steatosis or possible cirrhosis. Mild stable splenomegaly. Minimal ascites. 4. Colonic diverticulosis without active inflammation. 5. Table enlargement of the prostate with impression upon the bladder base. 6. Moderate size bilateral pleural effusions with associated bibasilar airspace density which may be atelectasis versus infection. 7. Aortic atherosclerosis. Possible focal dissection of the infrarenal abdominal aorta as the aorta is otherwise normal in caliber. Aortic Atherosclerosis (ICD10-I70.0). Electronically Signed   By: Toribio Agreste M.D.   On: 12/16/2024 12:51   CT Head Wo Contrast Result Date: 12/16/2024 EXAM: CT HEAD WITHOUT CONTRAST 12/16/2024 10:40:49 AM TECHNIQUE: CT of the head was performed without the administration of intravenous contrast. Automated exposure control, iterative reconstruction, and/or weight based adjustment of the mA/kV was utilized to reduce the radiation dose to as low as reasonably achievable. COMPARISON: MRI of the head dated 12/13/2024. CLINICAL HISTORY: Mental status change, unknown cause. FINDINGS: BRAIN AND VENTRICLES: No acute hemorrhage. No evidence of acute infarct. No hydrocephalus. No extra-axial collection. No mass effect or midline shift. Moderate chronic microvascular ischemic change sequela. Calcified meningioma along right frontal convexity. ORBITS: Bilateral lens replacement. SINUSES: No acute abnormality. SOFT TISSUES AND SKULL: No acute soft tissue abnormality. No skull fracture. IMPRESSION: 1. No acute intracranial abnormality. 2. Moderate chronic microvascular ischemic change sequela. 3. Calcified meningioma along right frontal convexity. Electronically signed by: Evalene Coho MD 12/16/2024 10:47 AM EST RP Workstation: [id number]    Scheduled Meds:  apixaban   5 mg Oral BID    dorzolamide -timolol   1 drop Left Eye BID   pantoprazole   40 mg Oral Daily   Continuous Infusions:  lactated ringers  75 mL/hr at 12/16/24 1739   meropenem  (MERREM ) IV Stopped (12/17/24 0002)     Unresulted Labs (From admission, onward)     Start     Ordered   12/17/24 0644  Protime-INR  Once,   R        12/17/24 0643   12/16/24 1020  Pathologist smear review  Once,   R        12/16/24 1020   12/16/24 0953  Culture, blood (Routine x 2)  BLOOD CULTURE X 2,   R (with STAT occurrences)      12/16/24 0952             LOS:  LOS: 1 day   Time Spent: 65 minutes  Dmari Schubring Al-Sultani, MD [facility]  If 7PM-7AM, please contact night-coverage  12/17/2024, 6:52 AM

## 2024-12-18 ENCOUNTER — Inpatient Hospital Stay (HOSPITAL_COMMUNITY)

## 2024-12-18 DIAGNOSIS — T68XXXA Hypothermia, initial encounter: Secondary | ICD-10-CM | POA: Diagnosis not present

## 2024-12-18 DIAGNOSIS — I480 Paroxysmal atrial fibrillation: Secondary | ICD-10-CM | POA: Diagnosis not present

## 2024-12-18 DIAGNOSIS — Z66 Do not resuscitate: Secondary | ICD-10-CM

## 2024-12-18 DIAGNOSIS — R7989 Other specified abnormal findings of blood chemistry: Secondary | ICD-10-CM

## 2024-12-18 DIAGNOSIS — R627 Adult failure to thrive: Secondary | ICD-10-CM | POA: Diagnosis not present

## 2024-12-18 DIAGNOSIS — Z7189 Other specified counseling: Secondary | ICD-10-CM | POA: Diagnosis not present

## 2024-12-18 DIAGNOSIS — Z515 Encounter for palliative care: Secondary | ICD-10-CM | POA: Diagnosis not present

## 2024-12-18 DIAGNOSIS — E43 Unspecified severe protein-calorie malnutrition: Secondary | ICD-10-CM | POA: Insufficient documentation

## 2024-12-18 DIAGNOSIS — I495 Sick sinus syndrome: Principal | ICD-10-CM

## 2024-12-18 DIAGNOSIS — R531 Weakness: Secondary | ICD-10-CM | POA: Diagnosis not present

## 2024-12-18 DIAGNOSIS — E860 Dehydration: Secondary | ICD-10-CM

## 2024-12-18 DIAGNOSIS — R338 Other retention of urine: Secondary | ICD-10-CM

## 2024-12-18 DIAGNOSIS — I4891 Unspecified atrial fibrillation: Secondary | ICD-10-CM

## 2024-12-18 DIAGNOSIS — R001 Bradycardia, unspecified: Secondary | ICD-10-CM

## 2024-12-18 LAB — COMPREHENSIVE METABOLIC PANEL WITH GFR
ALT: 23 U/L (ref 0–44)
AST: 18 U/L (ref 15–41)
Albumin: 3.4 g/dL — ABNORMAL LOW (ref 3.5–5.0)
Alkaline Phosphatase: 70 U/L (ref 38–126)
Anion gap: 7 (ref 5–15)
BUN: 41 mg/dL — ABNORMAL HIGH (ref 8–23)
CO2: 28 mmol/L (ref 22–32)
Calcium: 8.8 mg/dL — ABNORMAL LOW (ref 8.9–10.3)
Chloride: 104 mmol/L (ref 98–111)
Creatinine, Ser: 1.11 mg/dL (ref 0.61–1.24)
GFR, Estimated: >60 mL/min
Glucose, Bld: 99 mg/dL (ref 70–99)
Potassium: 4.3 mmol/L (ref 3.5–5.1)
Sodium: 140 mmol/L (ref 135–145)
Total Bilirubin: 1.2 mg/dL (ref 0.0–1.2)
Total Protein: 4.8 g/dL — ABNORMAL LOW (ref 6.5–8.1)

## 2024-12-18 LAB — CORTISOL: Cortisol, Plasma: 17.8 ug/dL

## 2024-12-18 LAB — CBC
HCT: 33.1 % — ABNORMAL LOW (ref 39.0–52.0)
Hemoglobin: 10.3 g/dL — ABNORMAL LOW (ref 13.0–17.0)
MCH: 29.8 pg (ref 26.0–34.0)
MCHC: 31.1 g/dL (ref 30.0–36.0)
MCV: 95.7 fL (ref 80.0–100.0)
Platelets: 64 K/uL — ABNORMAL LOW (ref 150–400)
RBC: 3.46 MIL/uL — ABNORMAL LOW (ref 4.22–5.81)
RDW: 14.9 % (ref 11.5–15.5)
WBC: 20 K/uL — ABNORMAL HIGH (ref 4.0–10.5)
nRBC: 0.4 % — ABNORMAL HIGH (ref 0.0–0.2)

## 2024-12-18 LAB — ECHOCARDIOGRAM COMPLETE
Area-P 1/2: 3.68 cm2
Height: 71 in
MV M vel: 4.26 m/s
MV Peak grad: 72.4 mmHg
S' Lateral: 3.3 cm
Weight: 2331.58 [oz_av]

## 2024-12-18 LAB — BASIC METABOLIC PANEL WITH GFR
Anion gap: 8 (ref 5–15)
BUN: 41 mg/dL — ABNORMAL HIGH (ref 8–23)
CO2: 30 mmol/L (ref 22–32)
Calcium: 8.6 mg/dL — ABNORMAL LOW (ref 8.9–10.3)
Chloride: 101 mmol/L (ref 98–111)
Creatinine, Ser: 1.29 mg/dL — ABNORMAL HIGH (ref 0.61–1.24)
GFR, Estimated: 52 mL/min — ABNORMAL LOW
Glucose, Bld: 124 mg/dL — ABNORMAL HIGH (ref 70–99)
Potassium: 4.2 mmol/L (ref 3.5–5.1)
Sodium: 139 mmol/L (ref 135–145)

## 2024-12-18 LAB — AMMONIA: Ammonia: 16 umol/L (ref 9–35)

## 2024-12-18 MED ORDER — FUROSEMIDE 10 MG/ML IJ SOLN
40.0000 mg | Freq: Once | INTRAMUSCULAR | Status: AC
  Administered 2024-12-18: 40 mg via INTRAVENOUS
  Filled 2024-12-18: qty 4

## 2024-12-18 MED ORDER — FUROSEMIDE 10 MG/ML IJ SOLN: 20.0000 mg | Freq: Once | INTRAMUSCULAR | Status: DC

## 2024-12-18 MED ORDER — ENSURE PLUS HIGH PROTEIN PO LIQD: 237.0000 mL | Freq: Two times a day (BID) | ORAL | Status: DC

## 2024-12-18 MED ORDER — DORZOLAMIDE HCL 2 % OP SOLN
1.0000 [drp] | Freq: Two times a day (BID) | OPHTHALMIC | Status: DC
  Administered 2024-12-18 – 2024-12-20 (×4): 1 [drp] via OPHTHALMIC
  Filled 2024-12-18: qty 10

## 2024-12-18 MED ORDER — POLYETHYLENE GLYCOL 3350 17 G PO PACK
17.0000 g | PACK | Freq: Two times a day (BID) | ORAL | Status: DC
  Administered 2024-12-18 – 2024-12-19 (×2): 17 g via ORAL
  Filled 2024-12-18 (×3): qty 1

## 2024-12-18 MED ORDER — PERFLUTREN LIPID MICROSPHERE
1.0000 mL | INTRAVENOUS | Status: AC | PRN
  Administered 2024-12-18: 2 mL via INTRAVENOUS

## 2024-12-18 NOTE — Progress Notes (Signed)
 eLink Physician-Brief Progress Note Patient Name: Jared Mahabir, PhD DOB: June 01, 1929 MRN: [id number]   Date of Service  12/18/2024  HPI/Events of Note  Patient desaturated into the 80's on high flow oxygen. Modest response to Lasix .  eICU Interventions  Trial of BIPAP to try to prevent intubation but I did have a conversation with the patient and his significant other (POA) letting them know he is at very high risk for requiring intubation.        Jared Neal 12/18/2024, 5:00 AM

## 2024-12-18 NOTE — Progress Notes (Signed)
 Initial Nutrition Assessment  DOCUMENTATION CODES:   Severe malnutrition in context of social or environmental circumstances (chronically decreased appetite in context of aging)  INTERVENTION:  Magic cup TID with meals, each supplement provides 290 kcal and 9 grams of protein  Mighty Shake TID with meals, each supplement provides 330 kcals and 9 grams of protein  Liberalized diet to regular w/ nectar thick to provide increased options to pt and promote better PO intake   Agree w/ palliative consult to establish GOC  NUTRITION DIAGNOSIS:   Severe Malnutrition related to social / environmental circumstances (chronically decreased appetite in context of aging) as evidenced by severe fat depletion, severe muscle depletion.  GOAL:   Patient will meet greater than or equal to 90% of their needs  MONITOR:   PO intake, Supplement acceptance  REASON FOR ASSESSMENT:   Consult Assessment of nutrition requirement/status  ASSESSMENT:   Pt with hx of HTN, prior CVA, atrial fibrillation, COPD, mild dementia, thrombocytopenia, and BPH. Admitted for worsening weakness and confusion.  12/18 admitted to 6E, transferred to [facility] following significant bradycardia and low heart rates s/p pacemaker placement  Pt's hematuria has resolved but continues to have fluid overload (noticed in abdomen area) and undergoing diuresis. CCM recommends palliative consult given pt's deconditioning and poor nutrition status.   Spoke with pt who was resting in bed. Pt endorses poor appetite today (0% of lunch eaten at bedside), states he is very fatigued and not hungry but has hopes that appetite will improve as day goes on. Pt reports issues with constipation, has not had bowel movement since 12/15, on miralax  BID. Will continue to monitor as constipation may be impacting satiety.   PTA, pt reports he was eating 3x per day. Reports he would not miss or skip meals but portions have declined over last year. Reports  significant other would do most cooking in the home. Breakfast consisted of eggs. Lunch and dinner consists of some type of meat with vegetables. Pt endorses no issues with chewing. Pt reports he does not avoid any specific foods and eats a variety. Pt was not drinking any ONS PTA.  Nutrition focused physical exam shows severe fat and severe muscle depletions. Likely in the setting of atrophy due to decline in movement, but also in combination with malnutrition in the setting of chronically decreased appetite. Pt meets criteria for severe malnutrition. Pt reports he was using a cane for mobility PTA but was not having any trouble getting around.  SLP eval shows some dysphagia with thin liquids, SLP recommends nectar thick. Switched pt to [employer] and magic cups to meet thickness recommendations. Liberalized pt's diet to increase options available and promote increased PO intake. Suspect pt will continue to have issues with low appetite given lethargy and agree with palliative consult to establish GOC.   NUTRITION - FOCUSED PHYSICAL EXAM: Pt reports using only a cane at baseline and getting around well PTA Flowsheet Row Most Recent Value  Orbital Region Severe depletion  Upper Arm Region Severe depletion  Thoracic and Lumbar Region Severe depletion  Buccal Region Moderate depletion  Temple Region Moderate depletion  Clavicle Bone Region Severe depletion  Clavicle and Acromion Bone Region Severe depletion  Scapular Bone Region Severe depletion  Dorsal Hand Moderate depletion  Patellar Region Severe depletion  Anterior Thigh Region Severe depletion  Posterior Calf Region Severe depletion  Edema (RD Assessment) Mild  [around abdomen area]  Hair Reviewed  Eyes Reviewed  Mouth Reviewed  Skin Reviewed  Nails  Reviewed    Diet Order:   Diet Order             Diet regular Room service appropriate? Yes with Assist; Fluid consistency: Nectar Thick  Diet effective now                    EDUCATION NEEDS:   Education needs have been addressed  Skin:  Skin Assessment: Reviewed RN Assessment  Last BM:  12/15  Height:   Ht Readings from Last 1 Encounters:  12/16/24 5' 11 (1.803 m)    Weight:   Wt Readings from Last 1 Encounters:  12/18/24 66.1 kg    Ideal Body Weight:  78.2 kg  BMI:  Body mass index is 20.32 kg/m.  Estimated Nutritional Needs:   Kcal:  1600-1800  Protein:  70-90g  Fluid:  1.6-1.8L    Josette Glance, MS, RDN, LDN Clinical Dietitian I Please reach out via secure chat

## 2024-12-18 NOTE — Consult Note (Cosign Needed Addendum)
 "                              Consultation Note Date: 12/18/2024   Patient Name: Jared Fruth, PhD  DOB: 11/17/1929  MRN: [id number]  Age / Sex: 89 y.o., male  PCP: [name], MD Referring Physician: Claudene Toribio BROCKS, MD  Reason for Consultation: Establishing goals of care  HPI/Patient Profile: 89 y.o. male  with past medical history significant of hypertension, CVA, GERD, A-fib, carotid artery disease, mild dementia, thrombocytopenia, COPD, BPH with urinary symptoms, ESBL colonization, status post left shoulder repair, and cholecystectomy admitted on 12/16/2024 with weakness and confusion.  Family reports he has had some intermittent slurred speech as well.  Also had at least 1 episode where he had left facial droop.  EMS was called due to persistent/worsening symptoms, and found patient to be bradycardic in the 20s.  Patient was then given 1 mg of atropine with improvement of heart rate to the 70s and transported to the ED for further evaluation.    Worth to note that patient also has a history of BPH, significant for suprapubic prostatectomy back in 2012, with regrowth of tissue.  Noted for consequent hematuria after attempting for Foley placement in the ED.   Patient has had 1 inpatient admission in the last 6 months.  This is hospital day 2.  PMT has been consulted to assist with goals of care conversation. Patient/Family face treatment option decisions, advanced directive decisions and anticipatory care needs.   Family face treatment option decision, advance directive decisions and anticipatory care needs.   Clinical Assessment and Goals of Care:  I have reviewed medical records including: EPIC notes: Reviewed cardiology note from 12/17/2024, detailing plan of care.  Patient has chronic bradycardia and in atrial fibrillation the plan was for him to have temporary venous pacer.  Patient also noted for suspicion of new congestive heart failure, will likely need diuresis.  Reviewed  critical care note from 12/17/2024, detailing plan of care mainly for A-fib, BPH (hematuria following traumatic Foley placement), hypothermia, sepsis, bilateral pleural effusion.  Reviewed to track clinical course and prognostication.  MAR: Reviewed PRN meds received over the last 24 hours. Reviewed to assess needs for medication adjustment to optimize comfort.  Available advanced directives in ACP: None Labs: Reviewed labs from 12/18/2024: Worsening leukocytosis from 15.9 yesterday to 20.0 today.  Hemoglobin low but stable at 10.3.  Reviewed to assist with prescribing and prognostication  Met with patient/family to discuss diagnosis prognosis, GOC, EOL wishes, disposition and options.  I introduced Palliative Medicine as specialized medical care for people living with serious illness. It focuses on providing relief from the symptoms and stress of a serious illness. The goal is to improve quality of life for both the patient and the family.  Created space and opportunity for family to explore thoughts and feelings regarding patient's current medical condition.   Patient is observed resting comfortably in bed. Not in any form of acute distress. No signs or gestures indicating pain or discomfort. Alert and oriented x 3, able to make needs known. Able to engage in a meaningful conversation.    The history for this encounter was obtained primarily from patient's significant other [name], and her 2 children, [name] and [name] during a goals of care family meeting.  The patient was admitted to the hospital following 4 days of progressive weakness, during which his daughter also observed a slowing of his speech.  His medical history is significant for bradycardia and atrial fibrillation.  In the days leading up to admission, he experienced episodes of low blood pressure, and bradycardia, and presented to the emergency department hypothermic.  His blood pressure was also soft.  The family demonstrates a good  understanding of his acute medical condition, recognizing that he is currently being treated for bradycardia with medications and a temporary pacemaker.  They reported earlier this morning, the patient developed respiratory distress, which raise concerns about his CODE STATUS.  Regarding his chronic conditions, the patient's S0 shared that his heart rate has historically been in the 50s, but in the last 4 days it dropped into the low 30s, prompting the ED visit.  She also noted that his medication regimen Aricept  may have contributed to the bradycardia, and that he has had poor oral intake.  Over the last few months, [name] describes a steady decline in his overall health.  When discussing hopes and worries, the patient and his family expressed hope that he will improve with current medical interventions, return to his baseline, and eventually be able to go home with his significant other, [name].  However they are worried that patient will fail to improve, that he will continue to decline or experience another acute episode like respiratory distress this morning.  Code status discussed with family. Full Code: All possible life-saving measures will be used if the heart or breathing stops. DNR-Limited: Some interventions may be withheld, but other treatments may continue. DNR-Interventions: No CPR or chest compressions, however may intubate, use advanced airway interventions and cardioversion/ACLS medications if appropriate or indicated. DNR Comfort: No resuscitation or aggressive interventions; all care is focused on comfort.  In addition, the process of compassionate extubation was explained, emphasizing a peaceful and comfort focused approach if needed.  The risk and complications of aggressive interventions, especially given his advanced age and disease burden were discussed, and DNR was recommended.  A  discussion was had today regarding advanced directives.  Concepts specific to code status. The  difference between a aggressive medical intervention path  and a palliative comfort care path for this patient at this time was had.  Values and goals of care important to patient and family were attempted to be elicited.    Discussed the importance of continued conversation with family and the medical providers regarding overall plan of care and treatment options, ensuring decisions are within the context of the patients values and GOCs.   At the conclusion of the family meeting, the patient and his family expressed the desire to continue with the current medical interventions and allow time to assess outcomes, with the hope for clinical improvement.  Regarding CODE STATUS, both the patient and his family elected to transition from full code to DNR with prearrest interventions.  They clearly expressed that no CPR or chest compressions are to be performed in the event of cardiac arrest.  However, they are agreeable to a limited, trial based approach that may include intubation, advanced airway management and the use of cardioversion and ACLS medications when appropriate or clinically indicated.  They also acknowledged that if the patient were to further decline despite of this prearrest interventions, including intubation, they would likely transition care to a comfort focused pathway.  The family is 61 that the patient would not want to live in a vegetative state or with poor quality of life, preferring to allow a natural death without chest compressions.  Questions and concerns were addressed. The family was encouraged  to call with questions or concerns.  PMT will continue to support holistically.  Social History: Patient lives with his wife.  Functional and Nutritional State: Patient is currently with functional limitation due to acute and chronic illnesses.  At baseline patient is able to walk with a cane, is alert and oriented x 4 and able to perform all ADLs.  Appetite has been  suboptimal.   Palliative Symptoms: Increasing weakness, loss of appetite, bradycardia.  Advance Directives: A detailed discussion regarding advanced directives was held with the patient and family today, highlighting the benefits of having clear documentation to guide care in accordance with the patients wishes. The patient has previously completed advanced directive documents. I advised the family to provide a copy for inclusion in our medical records to ensure their preferences are honored.   Code Status: Change to DNR-Interventions Concepts specific to code status, artificial feeding and hydration, continued IV antibiotics and rehospitalization was held. The difference between aggressive medical intervention and a palliative comfort path for this patient at this time was had.    Primary Decision Maker: Patient and  Significant Other (HCPOA) Ronda Outlaw    SUMMARY OF RECOMMENDATIONS   Code Status: Changed from full code to DNR-pre arrest interventions Goal of care is medical stabilization and recovery to the extent this is possible Per patient and family's desire, no chest compressions, but desires prearrest interventions such as intubation for trial period as appropriate. Continue to provide psycho-social and emotional support to patient and family Palliative medicine team will continue to follow.   Symptom Management: Per ICU team Palliative medicine is available to assist as needed.   Palliative Prophylaxis:  Aspiration, Bowel Regimen, Delirium Protocol, Frequent Pain Assessment, and Turn Reposition   Prognosis:  Unable to determine  Discharge Planning: To Be Determined    Discussed with: Patient, patient's family, nursing staff, and medical team.     Primary Diagnoses: Present on Admission:  Paroxysmal A-fib (HCC)  HTN (hypertension)  GERD (gastroesophageal reflux disease)  COPD (chronic obstructive pulmonary disease) (HCC)  Mild dementia (HCC)  Lower urinary  tract symptoms due to benign prostatic hyperplasia  Occlusion and stenosis of bilateral carotid arteries  Thrombocytopenia  Hypothermia  Traumatic hematuria    Physical Exam Vitals and nursing note reviewed.  HENT:     Head: Normocephalic and atraumatic.     Mouth/Throat:     Mouth: Mucous membranes are moist.  Cardiovascular:     Comments: Bradycardia Pulmonary:     Effort: Pulmonary effort is normal.     Comments: Not in distress. O2 supplement at 5LPM/[place] Abdominal:     General: Abdomen is flat.     Palpations: Abdomen is soft.  Genitourinary:    Comments: Foley catheter in place  Musculoskeletal:     Comments: Generalized weakness   Skin:    General: Skin is warm and dry.  Neurological:     General: No focal deficit present.     Mental Status: He is alert.     Vital Signs: BP 114/76   Pulse 60   Temp (!) 97.4 F (36.3 C) (Axillary)   Resp (!) 25   Ht 5' 11 (1.803 m)   Wt 66.1 kg   SpO2 100%   BMI 20.32 kg/m  Pain Scale: 0-10 POSS *See Group Information*: 1-Acceptable,Awake and alert Pain Score: 0-No pain   SpO2: SpO2: 100 % O2 Device:SpO2: 100 % O2 Flow Rate: .O2 Flow Rate (L/min): 15 L/min   Palliative Assessment/Data: 30 to 40%  I personally spent a total of 90 minutes in the care of the patient today including preparing to see the patient, getting/reviewing separately obtained history, performing a medically appropriate exam/evaluation, counseling and educating, placing orders, referring and communicating with other health care professionals, documenting clinical information in the EHR, independently interpreting results, communicating results, and coordinating care.      Kathlyne JULIANNA Tracie Mickey, NP  Palliative Medicine Team Team phone # [phone]  Thank you for allowing the Palliative Medicine Team to assist in the care of this patient. Please utilize secure chat with additional questions, if there is no response within 30 minutes  please call the above phone number.  Palliative Medicine Team providers are available by phone from 7am to 7pm daily and can be reached through the team cell phone.  Should this patient require assistance outside of these hours, please call the patient's attending physician.   "

## 2024-12-18 NOTE — Progress Notes (Addendum)
 eLink Physician-Brief Progress Note Patient Name: Jared Gunderman, PhD DOB: [birth year] MRN: [id number]   Date of Service  12/18/2024  HPI/Events of Note  Patient with increasing oxygen requirements and sounds wet on auscultation. Previous CXR suggestive of early pulmonary edema.  eICU Interventions  Lasix  40 mg iv x 1 ordered.        Geniva Lohnes U Brennan Litzinger 12/18/2024, 3:28 AM

## 2024-12-18 NOTE — Progress Notes (Addendum)
 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 EMS called d/t progressive weakness, change in mental status and bradycardia. Family reported he had not voided most of the day. Found have severe bradycardia to 20s, improved with atropine. Recurrent episodes after admission with asystole lasting up to 4 seconds s/p TVP.  Significant events:   12/18: Symptomatic bradycardia with asystolic episode up to 4 seconds. S/p TVP.  Subjective:    Increasing O2 requirement overnight, briefly required BiPAP.  Received IV lasix .   Has been covered with Meropenem  for possible sepsis.  Reports he's had poor appetite for some time. Has lost a lot of weight the last few months. Donepezil  was recently discontinued.   Objective:   Weight Range: 66.1 kg Body mass index is 20.32 kg/m.   Vital Signs:   Temp:  [95.7 F (35.4 C)-97.7 F (36.5 C)] 97.4 F (36.3 C) (12/19 0500) Pulse Rate:  [33-76] 60 (12/19 0800) Resp:  [11-31] 25 (12/19 0800) BP: (84-164)/(44-128) 114/76 (12/19 0800) SpO2:  [86 %-100 %] 100 % (12/19 0800) FiO2 (%):  [50 %] 50 % (12/19 0750) Weight:  [66.1 kg-67.9 kg] 66.1 kg (12/19 0500) Last BM Date : 12/14/24  Weight change: Filed Weights   12/16/24 0947 12/17/24 2000 12/18/24 0500  Weight: 66.2 kg 67.9 kg 66.1 kg    Intake/Output:   Intake/Output Summary (Last 24 hours) at 12/18/2024 0855 Last data filed at 12/18/2024 0700 Gross per 24 hour  Intake 245 ml  Output 2660 ml  Net -2415 ml     Physical Exam   General: Thin, chronically ill appearing elderly male Cor: Regular rate & rhythm. No murmurs. R internal jugular TVP.  Lungs: breathing nonlabored Extremities: no edema  Telemetry   V paced 60  Labs   CBC Recent Labs     12/16/24 1020 12/16/24 1029 12/17/24 0025 12/18/24 0201  WBC 8.5  --  15.9* 20.0*  NEUTROABS 1.4*  --   --   --   HGB 10.4*   < > 10.7* 10.3*  HCT 33.3*   < > 33.8* 33.1*  MCV 96.5  --  96.0 95.7  PLT 59*  --  79* 64*   < > = values in this interval not displayed.   Basic Metabolic Panel Recent Labs    87/81/74 0025 12/17/24 0903 12/18/24 0201  NA 138  --  140  K 4.9  --  4.3  CL 105  --  104  CO2 25  --  28  GLUCOSE 115*  --  99  BUN 42*  --  41*  CREATININE 1.13  --  1.11  CALCIUM  8.7*  --  8.8*  MG 2.3  --   --   PHOS  --  3.7  --    Liver Function Tests Recent Labs    12/17/24 0025 12/18/24 0201  AST 25 18  ALT 29 23  ALKPHOS 78 70  BILITOT 1.1 1.2  PROT 5.3* 4.8*  ALBUMIN 3.9 3.4*   Recent Labs    12/17/24 0025  LIPASE 26   Cardiac Enzymes No results for input(s): CKTOTAL, CKMB, CKMBINDEX, TROPONINI in the last 72 hours.  BNP: BNP (last 3 results) No results for input(s): BNP in the last 8760 hours.  ProBNP (last 3  results) Recent Labs    12/16/24 1320  PROBNP 2,863.0*     D-Dimer No results for input(s): DDIMER in the last 72 hours. Hemoglobin A1C No results for input(s): HGBA1C in the last 72 hours. Fasting Lipid Panel No results for input(s): CHOL, HDL, LDLCALC, TRIG, CHOLHDL, LDLDIRECT in the last 72 hours. Medications:   Scheduled Medications:  Chlorhexidine  Gluconate Cloth  6 each Topical Daily   dorzolamide -timolol   1 drop Left Eye BID   latanoprost   1 drop Both Eyes QHS   pantoprazole   40 mg Oral Daily    Infusions:  meropenem  (MERREM ) IV Stopped (12/17/24 2234)    PRN Medications: acetaminophen  **OR** acetaminophen , melatonin, perflutren  lipid microspheres (DEFINITY ) IV suspension, polyethylene glycol  Assessment/Plan   Bradycardia -Hx chronic bradycardia, rates typically 50s off AV nodal agents -Not certain if bradycardia is primary driver for failure to thrive picture or d/t combination of  other medical issues -HR 20s when EMS arrived on 12/17, improved to 70s after atropine. Later had asystolic episodes up to 4 seconds in duration and underwent TVP 12/18. -Some concern that there may be vagally mediated component d/t urinary issues.  -Note hypothermia on presentation -Keep off donepezil  as it can exacerbate bradycardia  -Hopefully bradycardia improves without need for PPM  Atrial fibrillation -Chronic -On Eliquis , currently on hold d/t hematuria  Acute CHF Bilateral pleural effusions -In setting of severe bradycardia -Echo in 2021 w/ preserved EF  -Echo today pending, on preliminary review LV function appears okay -Received IV lasix  X 2 with good response  Pericardial effusion -Small to moderate on CT -Will assess on echo  BPH Urinary retention  Hematuria -Bladder irrigation per Urology  Hypothermia -Treated empirically with merrem  for possible sepsis with merrem , no clear source of infection. PCT negative. Unable to interpret UA d/t blood. -Hx ESBL Klebsiella pneumoniae colonization that is likely not actionable  Length of Stay: 2  FINCH, LINDSAY N, PA-C  12/18/2024, 8:55 AM  Advanced Heart Failure Team Pager [phone] (M-F; 7a - 5p)   Please visit [email or web address]: For overnight coverage please call cardiology fellow first. If fellow not available call Shock/ECMO MD on call.  For ECMO / Mechanical Support (Impella, IABP, LVAD) issues call Shock / ECMO MD on call.    Patient seen with PA, I formulated the plan and agree with the above note.   He is currently RV-paced.  Underlying rhythm today is AF with rate around 40 bpm.  BP stable. He is awake and alert.   PCT < 0.1, currently on meropenem  due to concern for ?urosepsis.   General: NAD, frail Neck: JVP 10-12 cm, no thyromegaly or thyroid  nodule.  Lungs: Clear to auscultation bilaterally with normal respiratory effort. CV: Nondisplaced PMI.  Heart regular S1/S2, no S3/S4, no murmur.  No peripheral edema.     Abdomen: Soft, nontender, no hepatosplenomegaly, no distention.  Skin: Intact without lesions or rashes.  Neurologic: Alert and oriented x 3.  Psych: Normal affect. Extremities: No clubbing or cyanosis.  HEENT: Normal.   History of permanent atrial fibrillation, baseline HR in 50s per wife.  HR noted to be in 30s by wife for 4 days prior to admission.  In hospital, had long pauses in the setting of foley placement for hematuria and bladder irrigation.  Now with TTVP.  Currently paced at 60, underlying rhythm is AF around 40.  Suspect sick sinus syndrome.  He was recently taken off Aricept .  Will follow over weekend for HR recovery, may need PPM.  He is not currently anticoagulated due to hematuria.   Reviewed with CCM, probably not infected.  PCT < 0.1.  Initial concern due to hypothermia.  Urine difficult to interpret due to hematuria.  He is on meropenem  for now.   Echo showed EF 60-65%, normal RV, PASP 44, dilated IVC.  Mild volume overload on exam.  Will give Lasix  40 mg IV bid today.   CRITICAL CARE Performed by: Ezra Shuck  Total critical care time: 40 minutes  Critical care time was exclusive of separately billable procedures and treating other patients.  Critical care was necessary to treat or prevent imminent or life-threatening deterioration.  Critical care was time spent personally by me on the following activities: development of treatment plan with patient and/or surrogate as well as nursing, discussions with consultants, evaluation of patient's response to treatment, examination of patient, obtaining history from patient or surrogate, ordering and performing treatments and interventions, ordering and review of laboratory studies, ordering and review of radiographic studies, pulse oximetry and re-evaluation of patient's condition.   Ezra Shuck 12/18/2024 2:02 PM  "

## 2024-12-18 NOTE — Progress Notes (Signed)
 PT Cancellation Note  Patient Details Name: Jared Desaulniers, Jared Neal MRN: [id number] DOB: 1929-12-25   Cancelled Treatment:    Reason Eval/Treat Not Completed: (P) Other (comment) Pt family is awaiting Palliative Consult. RN will inform therapy of results. PT will follow back if available today, otherwise will follow back another day.  Jared Neal PT, DPT Acute Rehabilitation Services Please use secure chat or  Call Office [phone]    Jared Neal [facility] 12/18/2024, 12:42 PM

## 2024-12-18 NOTE — Progress Notes (Signed)
 "  1 Day Post-Op Subjective: Patient reportedly went into urinary retention overnight at which time an 78 French coud catheter was placed.  Not draining with 500 mL of urinary retention and balloon in prostate on arrival.  Scant bloody drainage in bag and patient reporting significant pain.  Significant other, nurse, and hospitalist all at bedside as well.  Objective: Vital signs in last 24 hours: Temp:  [95.7 F (35.4 C)-97.7 F (36.5 C)] 97.6 F (36.4 C) (12/19 1100) Pulse Rate:  [39-76] 59 (12/19 1230) Resp:  [11-27] 23 (12/19 1230) BP: (84-141)/(44-94) 95/53 (12/19 1230) SpO2:  [86 %-100 %] 98 % (12/19 1230) FiO2 (%):  [50 %] 50 % (12/19 0750) Weight:  [66.1 kg-67.9 kg] 66.1 kg (12/19 0500)  Assessment/Plan: 89 year old male with Hx of suprapubic prostatectomy in 2012, regrowth of tissue with 86 g prostate noted by 2016.  Gross hematuria following traumatic Foley placement while on Eliquis .  # Gross hematuria # BPH # Chronic anticoagulation # Traumatic Foley placement  Thankfully urine has completely cleared as of this morning.  No need to exchange his Foley catheter.  Since he had a traumatic Foley insertion prior to going into retention, and a large prostate, I cannot completely say whether or not he experienced urinary retention secondary to traumatic Foley insertion,  BPH, or both.  Would recommend keeping Foley catheter in place for at least a week to allow any edema from trauma to subside either way and consider voiding trial at that time.  Can be completed in the clinic if he is ready to discharge home by then.  Please start Flomax 0.4 mg daily if medically reasonable. Urology will follow peripherally.  Please call with questions.   Intake/Output from previous day: 12/18 0701 - 12/19 0700 In: 245 [IV Piggyback:100] Out: 2660 [Urine:2660]  Intake/Output this shift: Total I/O In: -  Out: 255 [Urine:255]  Physical Exam:  General: Alert and oriented CV: No  cyanosis Lungs: equal chest rise Gu: 5f coud catheter in place draining clear yellow urine  Lab Results: Recent Labs    12/16/24 1029 12/17/24 0025 12/18/24 0201  HGB 10.2*  10.2* 10.7* 10.3*  HCT 30.0*  30.0* 33.8* 33.1*   BMET Recent Labs    12/17/24 0025 12/18/24 0201  NA 138 140  K 4.9 4.3  CL 105 104  CO2 25 28  GLUCOSE 115* 99  BUN 42* 41*  CREATININE 1.13 1.11  CALCIUM  8.7* 8.8*  HGB 10.7* 10.3*  WBC 15.9* 20.0*     Studies/Results: ECHOCARDIOGRAM COMPLETE Result Date: 12/18/2024    ECHOCARDIOGRAM REPORT   Patient Name:   Jared Neal Date of Exam: 12/18/2024 Medical Rec #:  [id number]      Height:       71.0 in Accession #:    [id number]     Weight:       145.7 lb Date of Birth:  [birth year]      BSA:          1.843 m Patient Age:    89 years       BP:           114/68 mmHg Patient Gender: M              HR:           60 bpm. Exam Location:  Inpatient Procedure: 2D Echo, Cardiac Doppler, Color Doppler and Intracardiac            Opacification Agent (Both Spectral and Color  Flow Doppler were            utilized during procedure). Indications:    Elevated brain natriuretic peptide (BNP) levels; symptomatic                 bradycardia  History:        Patient has prior history of Echocardiogram examinations, most                 recent 08/18/2020. Pacemaker, COPD and Stroke, Arrythmias:Atrial                 Fibrillation; Risk Factors:Hypertension and Former Smoker.  Sonographer:    Merlynn Argyle Referring Phys: [id number] MARSA NOVAK MELVIN  Sonographer Comments: Technically difficult study due to poor echo windows and suboptimal apical window. IMPRESSIONS  1. Left ventricular ejection fraction, by estimation, is 60 to 65%. The left ventricle has normal function. The left ventricle has no regional wall motion abnormalities. There is mild left ventricular hypertrophy. Left ventricular diastolic parameters are indeterminate.  2. Right ventricular systolic function is normal. The  right ventricular size is mildly enlarged. There is mildly elevated pulmonary artery systolic pressure. The estimated right ventricular systolic pressure is 44.4 mmHg.  3. Left atrial size was moderately dilated.  4. Right atrial size was moderately dilated.  5. A small pericardial effusion is present. Large pleural effusion in the left lateral region.  6. The mitral valve is normal in structure. Trivial mitral valve regurgitation. No evidence of mitral stenosis.  7. The aortic valve is tricuspid. Aortic valve regurgitation is not visualized. No aortic stenosis is present.  8. Aortic dilatation noted. There is mild dilatation of the ascending aorta, measuring 39 mm.  9. The inferior vena cava is dilated in size with <50% respiratory variability, suggesting right atrial pressure of 15 mmHg. FINDINGS  Left Ventricle: Left ventricular ejection fraction, by estimation, is 60 to 65%. The left ventricle has normal function. The left ventricle has no regional wall motion abnormalities. Definity  contrast agent was given IV to delineate the left ventricular  endocardial borders. The left ventricular internal cavity size was normal in size. There is mild left ventricular hypertrophy. Left ventricular diastolic parameters are indeterminate. Right Ventricle: The right ventricular size is mildly enlarged. No increase in right ventricular wall thickness. Right ventricular systolic function is normal. There is mildly elevated pulmonary artery systolic pressure. The tricuspid regurgitant velocity is 2.71 m/s, and with an assumed right atrial pressure of 15 mmHg, the estimated right ventricular systolic pressure is 44.4 mmHg. Left Atrium: Left atrial size was moderately dilated. Right Atrium: Right atrial size was moderately dilated. Pericardium: A small pericardial effusion is present. Mitral Valve: The mitral valve is normal in structure. Trivial mitral valve regurgitation. No evidence of mitral valve stenosis. Tricuspid Valve:  The tricuspid valve is normal in structure. Tricuspid valve regurgitation is trivial. Aortic Valve: The aortic valve is tricuspid. Aortic valve regurgitation is not visualized. No aortic stenosis is present. Pulmonic Valve: The pulmonic valve was not well visualized. Pulmonic valve regurgitation is not visualized. Aorta: The aortic root is normal in size and structure and aortic dilatation noted. There is mild dilatation of the ascending aorta, measuring 39 mm. Venous: The inferior vena cava is dilated in size with less than 50% respiratory variability, suggesting right atrial pressure of 15 mmHg. IAS/Shunts: The interatrial septum was not well visualized. Additional Comments: There is a large pleural effusion in the left lateral region.  LEFT VENTRICLE PLAX 2D LVIDd:  5.10 cm   Diastology LVIDs:         3.30 cm   LV e' lateral:   10.50 cm/s LV PW:         1.30 cm   LV E/e' lateral: 8.5 LV IVS:        1.20 cm LVOT diam:     2.10 cm LV SV:         49 LV SV Index:   26 LVOT Area:     3.46 cm LV IVRT:       88 msec  RIGHT VENTRICLE          IVC RV Basal diam:  4.20 cm  IVC diam: 2.70 cm RV Mid diam:    3.00 cm TAPSE (M-mode): 1.3 cm LEFT ATRIUM             Index        RIGHT ATRIUM           Index LA diam:        5.50 cm 2.98 cm/m   RA Area:     25.90 cm LA Vol (A2C):   91.5 ml 49.64 ml/m  RA Volume:   80.70 ml  43.78 ml/m LA Vol (A4C):   83.1 ml 45.09 ml/m LA Biplane Vol: 89.2 ml 48.40 ml/m  AORTIC VALVE LVOT Vmax:   56.80 cm/s LVOT Vmean:  40.900 cm/s LVOT VTI:    0.141 m  AORTA Ao Root diam: 3.80 cm Ao Asc diam:  3.90 cm MITRAL VALVE               TRICUSPID VALVE MV Area (PHT): 3.68 cm    TR Peak grad:   29.4 mmHg MV Decel Time: 206 msec    TR Vmax:        271.00 cm/s MR Peak grad: 72.4 mmHg MR Mean grad: 48.0 mmHg    SHUNTS MR Vmax:      425.50 cm/s  Systemic VTI:  0.14 m MR Vmean:     322.0 cm/s   Systemic Diam: 2.10 cm MV E velocity: 89.50 cm/s MV A velocity: 38.10 cm/s MV E/A ratio:  2.35  Lonni Nanas MD Electronically signed by Lonni Nanas MD Signature Date/Time: 12/18/2024/9:45:58 AM    Final    CARDIAC CATHETERIZATION Result Date: 12/17/2024 Successful placement of a right internal jugular transvenous pacing catheter with capture threshold 0.6 mA.  Pacemaker set at 70 bpm, output 5 mA.  Patient catheter secured in place at 38 cm.   CT CHEST WO CONTRAST Result Date: 12/17/2024 CLINICAL DATA:  Shortness of breath (Ped 0-17y) Concern for aspiration pneumonia EXAM: CT CHEST WITHOUT CONTRAST TECHNIQUE: Multidetector CT imaging of the chest was performed following the standard protocol without IV contrast. RADIATION DOSE REDUCTION: This exam was performed according to the departmental dose-optimization program which includes automated exposure control, adjustment of the mA and/or kV according to patient size and/or use of iterative reconstruction technique. COMPARISON:  December 16, 2024 FINDINGS: Cardiovascular: Mild cardiomegaly. Small to moderate volume pericardial effusion. Scattered multi-vessel coronary atherosclerosis. No aortic aneurysm. Scattered aortic atherosclerosis. Mediastinum/Nodes: No mediastinal mass.Mildly enlarged multistation mediastinal lymph nodes measuring up to 1.1 cm, likely reactive. No axillary or hilar lymphadenopathy. Lungs/Pleura: The midline trachea and bronchi are patent. Moderate volume bilateral pleural effusions, larger on the right than the left with compressive atelectasis in both upper and lower lobes. No pneumothorax. Minimal intralobular septal thickening noted predominantly in the upper lobes bilaterally. Musculoskeletal: No acute fracture or destructive bone  lesion. Moderate joint space loss of both shoulders, likely degenerative. Loose bodies in the left shoulder joint. Multilevel degenerative disc disease of the spine. Upper Abdomen: No acute abnormality in the partially visualized upper abdomen. Similar appearing splenomegaly.  IMPRESSION: 1. Cardiomegaly with minimal intralobular septal thickening noted in the lungs, possibly reflecting early interstitial edema. Moderate volume bilateral pleural effusions, larger on the right than the left, with compressive atelectasis in the lungs. 2. Mildly enlarged multistation mediastinal lymph nodes, measuring up to 1.1 cm, likely reactive. 3. Small to moderate volume pericardial effusion. Aortic Atherosclerosis (ICD10-I70.0). Electronically Signed   By: Rogelia Myers M.D.   On: 12/17/2024 10:56   MR BRAIN WO CONTRAST Result Date: 12/16/2024 EXAM: MRI BRAIN WITHOUT CONTRAST 12/16/2024 04:57:39 PM TECHNIQUE: Multiplanar multisequence MRI of the head/brain was performed without the administration of intravenous contrast. COMPARISON: Head CT 12/16/2024 and MRI 12/13/2024. CLINICAL HISTORY: Neuro deficit, acute, stroke suspected; Transient ischemic attack (TIA). Worsening weakness and slowed speech. FINDINGS: LIMITATIONS: The examination is mildly motion degraded. BRAIN AND VENTRICLES: No acute infarct, midline shift, extra-axial fluid collection, or hydrocephalus is identified. A small focus of chronic hemorrhage is again noted in the left temporo-occipital region along the medial margin of the atrium of the left lateral ventricle. Scattered small T2 hyperintensities in the cerebral white matter bilaterally are unchanged and nonspecific but compatible with mild chronic small vessel ischemic disease. There is a small chronic right cerebellar infarct. There is mild cerebral atrophy. A 1.7 cm extra-axial mass in the left middle cranial fossa and smaller extra-axial masses along the anterior falx and right frontoparietal convexity are all unchanged from the recent prior MRI and are without significant associated mass effect or brain edema. Major intracranial vascular flow voids are preserved. ORBITS: Bilateral cataract extraction. SINUSES AND MASTOIDS: Trace left mastoid fluid. Clear paranasal  sinuses. BONES AND SOFT TISSUES: Normal marrow signal. Unchanged 2.2 x 1.1 cm superficial cystic focus in the left preauricular region. IMPRESSION: 1. No acute intracranial abnormality. 2. 4 unchanged extra-axial masses compatible with meningiomas. No significant mass effect or brain edema. 3. Mild chronic small vessel ischemic disease. 4. Small chronic right cerebellar infarct. Electronically signed by: Dasie Hamburg MD 12/16/2024 05:23 PM EST RP Workstation: [id number]      LOS: 2 days   Ole Bourdon, NP [facility] Pager: [phone]  12/18/2024, 1:59 PM   "

## 2024-12-18 NOTE — Progress Notes (Signed)
 OT Cancellation Note  Patient Details Name: Jared Jaquith, PhD MRN: [id number] DOB: [birth year]   Cancelled Treatment:    Reason Eval/Treat Not Completed: Other (comment). Per RN, family consulting with palliative care for goals of care. Requesting therapy to follow up at a later time. Will follow as able to.   Ruthmary Occhipinti C, OT  Acute Rehabilitation Services Office [phone] Secure chat preferred   Adrianne GORMAN Savers 12/18/2024, 12:22 PM

## 2024-12-18 NOTE — Progress Notes (Signed)
 "  NAME:  Jared Sao, PhD, MRN:  [id number], DOB:  01-17-1929, LOS: 2 ADMISSION DATE:  12/16/2024, CONSULTATION DATE:  12/17/2024 REFERRING MD:  Aloha Daria Bitter, MD, CHIEF COMPLAINT:  Symptomatic Bradycardia   History of Present Illness:  This 89 year old gentleman is admitted to the hospitalist service.  He has a history of hypertension CVA atrial fibrillation, COPD, mild dementia, thrombocytopenia, BPH presents to the emergency room with progressive weakness confusion.  CT abdomen pelvis did not show anything acute.  Patient was having a difficult time with urinary obstruction.  He has hematuria.  Urology has seen him and exchanged his Foley and irrigating it.  Patient went into bradycardia with significant low heart rates.  Cardiology has seen him.  He is taken to Cath Lab for pacemaker placement.  ICU is asked to admit this patient after he pacemaker placement to monitor hemodynamics closely.  Pertinent  Medical History   Past Medical History:  Diagnosis Date   Acute, but ill-defined, cerebrovascular disease 04/07/2014   Adenomatous colon polyp    Allergy    Atrial fibrillation (HCC)    BPH (benign prostatic hyperplasia)    Cataract    removed both eyes    Community acquired pneumonia 03/05/2022   COPD (chronic obstructive pulmonary disease) (HCC)    Eczema    Erectile dysfunction    Gallstones    GERD (gastroesophageal reflux disease)    Heart murmur    HOH (hard of hearing)    HTN (hypertension)    Meningioma (HCC)    Right frontal   Meralgia paraesthetica    Palpitations    Urinary retention    Significant Hospital Events: Including procedures, antibiotic start and stop dates in addition to other pertinent events   12/17/2024 - Pacemaker placed.  Interim History / Subjective:  No events Urine cleared up!  Objective    Blood pressure 114/76, pulse 60, temperature (!) 97.4 F (36.3 C), temperature source Axillary, resp. rate (!) 25, height 5' 11 (1.803 m),  weight 66.1 kg, SpO2 100%.    Vent Mode: BIPAP;PCV FiO2 (%):  [50 %] 50 % Set Rate:  [15 bmp] 15 bmp PEEP:  [8 cmH20] 8 cmH20   Intake/Output Summary (Last 24 hours) at 12/18/2024 0941 Last data filed at 12/18/2024 0800 Gross per 24 hour  Intake 245 ml  Output 2885 ml  Net -2640 ml   Filed Weights   12/16/24 0947 12/17/24 2000 12/18/24 0500  Weight: 66.2 kg 67.9 kg 66.1 kg   No distress Very hard of hearing Lungs clear Ext warm Mild temporal wasting Moves everything to command, good strength Foley urine now more amber  WBC noted, drop in plts noted  Resolved problem list   Assessment and Plan  Has been having FTT, unintentional weight loss and intermittent vertigo in months leading to admission.  States HR always low; here looks like we have some iatrogenic hematuria that is improved.  Addressing this did require TVP as having vagal pauses.  Hematuria now resolved.  No role for CBI.  Keep foley for now, may end up going home with it.  Some hypothermia on admission and leukocytosis but pct neg and urine just looks like blood from foley trauma.  Don't have great explanation for hypothermia.  TSH okay, cortisol okay.  Would turn down pacer and see what intrinsic rhythm looks like now that hematuria improved  Eliquis  and any AC out of question for a while, he is also a very high fall risk  Heart  looks fine on echo, do note overall fluid overloaded status perhaps exacerbated by low HR, agree with diuresis  Plt drop likely just from bleeding; he's chronically been low since about 4 years ago.  So overall this was admission for confusion and global weakness with possible sepsis criteria being met complicated by traumatic foley insertion leading to hematuria and presumed exaggerated vagal response trying to address.  Family thinks many of issues may be related to polypharmacy, noted recent changes in meds so will see how this settles out his overall condition.  From confusion  standpoint he is back to baseline.  From weakness standpoint, chronic and progressive issue that I do not really think is from a low flow state.  A lot of presentation is just normal aging, fluid overload plus poor nutrition.  RD to see patient, SLP has already seen.  PT/OT are in.  Agree with palliative consult, there may not be much to reverse here.  Will d/w cardiology regarding underlying rhythm and if we are good to remove wire.  I would probably have him walk with PT with pacer off before we remove wire.  Will take him off eye gtt beta blocker but doubt this is contributing.  Family updated  33 min cc time Rolan Sharps MD PCCM "

## 2024-12-18 NOTE — Progress Notes (Signed)
 PT Cancellation Note  Patient Details Name: Jared Atteberry, PhD MRN: [id number] DOB: May 20, 1929   Cancelled Treatment:    Reason Eval/Treat Not Completed: (P) Patient not medically ready Pt is awaiting SLP eval and tolerance to nasal cannula. PT will follow back later today to determine appropriateness for Evaluation.  Arleta Ostrum B. Neal Lapidus PT, DPT Acute Rehabilitation Services Please use secure chat or  Call Office [phone]    Jared Neal [facility] 12/18/2024, 8:47 AM

## 2024-12-18 NOTE — Plan of Care (Signed)
  Problem: Clinical Measurements: Goal: Ability to maintain clinical measurements within normal limits will improve Outcome: Progressing Goal: Respiratory complications will improve Outcome: Progressing   Problem: Coping: Goal: Level of anxiety will decrease Outcome: Progressing   Problem: Pain Managment: Goal: General experience of comfort will improve and/or be controlled Outcome: Progressing

## 2024-12-18 NOTE — Evaluation (Signed)
 Clinical/Bedside Swallow Evaluation Patient Details  Name: Jared Halderman, PhD MRN: [id number] Date of Birth: Nov 29, 1929  Today's Date: 12/18/2024 Time: SLP Start Time (ACUTE ONLY): 0947 SLP Stop Time (ACUTE ONLY): 0919 SLP Time Calculation (min) (ACUTE ONLY): 1412 min  Past Medical History:  Past Medical History:  Diagnosis Date   Acute, but ill-defined, cerebrovascular disease 04/07/2014   Adenomatous colon polyp    Allergy    Atrial fibrillation (HCC)    BPH (benign prostatic hyperplasia)    Cataract    removed both eyes    Community acquired pneumonia 03/05/2022   COPD (chronic obstructive pulmonary disease) (HCC)    Eczema    Erectile dysfunction    Gallstones    GERD (gastroesophageal reflux disease)    Heart murmur    HOH (hard of hearing)    HTN (hypertension)    Meningioma (HCC)    Right frontal   Meralgia paraesthetica    Palpitations    Urinary retention    Past Surgical History:  Past Surgical History:  Procedure Laterality Date   BLADDER DIVERTICULECTOMY  02/15/2003   thelbert 05/16/2011   CARDIAC CATHETERIZATION     CARDIOVERSION N/A 06/17/2020   Procedure: CARDIOVERSION;  Surgeon: Delford [name], MD;  Location: [facility] ENDOSCOPY;  Service: Cardiovascular;  Laterality: N/A;   CATARACT EXTRACTION Bilateral 2013   CHOLECYSTECTOMY N/A 11/19/2014   Procedure: LAPAROSCOPIC CHOLECYSTECTOMY WITH INTRAOPERATIVE CHOLANGIOGRAM;  Surgeon: Krystal Russell, MD;  Location: [facility] OR;  Service: General;  Laterality: N/A;   COLONOSCOPY     COLONOSCOPY W/ POLYPECTOMY     GUM SURGERY  1999   INGUINAL HERNIA REPAIR Left    INGUINAL HERNIA REPAIR Right 11/2009   LAPAROSCOPIC CHOLECYSTECTOMY  11/19/2014   w/IOC   MEATOTOMY  06/2003   Distal urethral meatotomy with YAG laser/notes 05/16/2011   POLYPECTOMY     SUPRAPUBIC PROSTATECTOMY  02/15/2003   thelbert 05/16/2011   TEMPORARY PACEMAKER N/A 12/17/2024   Procedure: TEMPORARY PACEMAKER;  Surgeon: Wonda Sharper, MD;  Location: [facility]  INVASIVE CV LAB;  Service: Cardiovascular;  Laterality: N/A;   HPI:  [name] is a 89 y.o. male who presented to ED 12/17 from home with four days weakness and MS changes. Dx hypothermia, bradycardia, encephalopathy, r/o sepsis.  Now s/p pacemaker placement. PMHx hypertension, CVA, GERD, A-fib, CAD, mild dementia, thrombocytopenia, COPD, BPH with urinary symptoms    Assessment / Plan / Recommendation  Clinical Impression  Pt presents with clinical indicators of pharyngeal dysphagia.  Pt initially tolerated thin liquids without difficulty, but coughing was noted with straw and then with further cup sips of thin liquid. Wet vocal quality observed x1 with speech after swallow.  Pt has been on BiPAP, but transistioned to [place] for this evaluation with steady SpO2 of 100% and RR 16-21. Delayed wet cough with thin liquids x1 which may be related to reflux. With nectar/mildly thick liquid by cup and straw, including serial sips, there were no clinical s/s of aspiration.  Pt exhibited good tolerance and oral clearance of solids. Pt appears safe to initiate a modifed diet with mildly thick liquids.  Discussed care plan extensively with family.  Offered FEES this date v clinical management with modified diet and reassessment early next week to determine if pt can advance to thin liquids or if an instrumental is indicated.  Pt with hx COPD and GERD, but no acute neuro changes, and Chest CT 12/18 indicated 1. Cardiomegaly with minimal intralobular septal thickening noted in the lungs, possibly reflecting early  interstitial edema. Moderate volume bilateral pleural effusions, larger on the right than the left, with compressive atelectasis in the lungs.  2. Mildly enlarged multistation mediastinal lymph nodes, measuring  up to 1.1 cm, likely reactive.  3. Small to moderate volume pericardial effusion. Intubation was brief and for procedure only, pt's vocal quality is strong.  Family opted for clinical management of  swallow at this time on modified diet. Provided education regarding thickener use, and education regarding reflux precautions. Should pt have any swallowing needs over the weekend, please reach out via secure chat to [facility] SLP group, or call acute rehab office at [phone].    Recommend regular texture diet with nectar/mildly thick liquids.   SLP Visit Diagnosis: Dysphagia, unspecified (R13.10)    Aspiration Risk  Mild aspiration risk    Diet Recommendation Regular;Nectar-thick liquid    Liquid Administration via: Spoon;Straw  Medication Administration: Whole meds with liquid (or puree if needed)  Supervision: Staff to assist with self feeding;Intermittent supervision to cue for compensatory strategies  Compensations:  Slow rate; Small sips/bites  Postural Changes:  Remain upright for at least 30 minutes after po intake; Seated upright at 90 degrees    Other Recommendations Recommended Consults:  (Consider RD assessment) Oral Care Recommendations: Oral care BID Caregiver Recommendations: Avoid jello, ice cream, thin soups, popsicles     Swallow Evaluation Recommendations Caregiver Recommendations: Avoid jello, ice cream, thin soups, popsicles   Assistance Recommended at Discharge  N/A  Functional Status Assessment Patient has had a recent decline in their functional status and demonstrates the ability to make significant improvements in function in a reasonable and predictable amount of time.  Frequency and Duration min 2x/week  2 weeks       Prognosis Prognosis for improved oropharyngeal function: Good      Swallow Study   General Date of Onset: 12/16/24 HPI: Jared Neal is a 89 y.o. male who presented to ED 12/17 from home with four days weakness and MS changes. Dx hypothermia, bradycardia, encephalopathy, r/o sepsis.  Now s/p pacemaker placement. PMHx hypertension, CVA, GERD, A-fib, CAD, mild dementia, thrombocytopenia, COPD, BPH with urinary symptoms Type of  Study: Bedside Swallow Evaluation Previous Swallow Assessment: none Diet Prior to this Study: NPO Temperature Spikes Noted: No Respiratory Status: Nasal cannula (6L) History of Recent Intubation: Yes Total duration of intubation (days):  (for procedure only) Behavior/Cognition: Alert;Cooperative;Pleasant mood;Confused Oral Cavity Assessment: Within Functional Limits (thick secretions) Oral Care Completed by SLP: Yes Oral Cavity - Dentition: Adequate natural dentition Patient Positioning: Upright in bed Baseline Vocal Quality: Normal Volitional Cough:  (Fair) Volitional Swallow: Able to elicit    Oral/Motor/Sensory Function Overall Oral Motor/Sensory Function: Within functional limits   Ice Chips Ice chips: Not tested   Thin Liquid Thin Liquid: Impaired Presentation: Cup;Straw Pharyngeal  Phase Impairments: Wet Vocal Quality;Throat Clearing - Immediate;Cough - Immediate    Nectar Thick Nectar Thick Liquid: Within functional limits Presentation: Straw;Cup   Honey Thick Honey Thick Liquid: Not tested   Puree Puree: Within functional limits   Solid     Solid: Within functional limits      Anette FORBES Grippe, MA, CCC-SLP Acute Rehabilitation Services Office: [phone] 12/18/2024,9:40 AM

## 2024-12-18 NOTE — Progress Notes (Signed)
 Echocardiogram 2D Echocardiogram has been performed.  Jared Neal 12/18/2024, 8:54 AM

## 2024-12-18 NOTE — Plan of Care (Signed)
" °  Problem: Education: Goal: Knowledge of General Education information will improve Description: Including pain rating scale, medication(s)/side effects and non-pharmacologic comfort measures Outcome: Progressing   Problem: Clinical Measurements: Goal: Will remain free from infection Outcome: Progressing Goal: Diagnostic test results will improve Outcome: Progressing Goal: Cardiovascular complication will be avoided Outcome: Progressing   Problem: Elimination: Goal: Will not experience complications related to urinary retention Outcome: Progressing   Problem: Health Behavior/Discharge Planning: Goal: Ability to manage health-related needs will improve Outcome: Not Progressing   Problem: Clinical Measurements: Goal: Ability to maintain clinical measurements within normal limits will improve Outcome: Not Progressing Goal: Respiratory complications will improve Outcome: Not Progressing   Problem: Activity: Goal: Risk for activity intolerance will decrease Outcome: Not Progressing   Problem: Nutrition: Goal: Adequate nutrition will be maintained Outcome: Not Progressing   Problem: Coping: Goal: Level of anxiety will decrease Outcome: Not Progressing   Problem: Pain Managment: Goal: General experience of comfort will improve and/or be controlled Outcome: Not Progressing   Problem: Safety: Goal: Ability to remain free from injury will improve Outcome: Not Progressing   Problem: Skin Integrity: Goal: Risk for impaired skin integrity will decrease Outcome: Not Progressing   "

## 2024-12-18 NOTE — Progress Notes (Signed)
 RT called to bedside to trial pt off bipap to be seen by speech therapy. Pt place on HFNC (salter) 5L, sats 100%. Pt seems to be in no respiratory distress at this time. Bipap on s/b at bedside if needed.

## 2024-12-19 ENCOUNTER — Inpatient Hospital Stay (HOSPITAL_COMMUNITY)

## 2024-12-19 DIAGNOSIS — I5031 Acute diastolic (congestive) heart failure: Secondary | ICD-10-CM | POA: Diagnosis not present

## 2024-12-19 DIAGNOSIS — R338 Other retention of urine: Secondary | ICD-10-CM | POA: Diagnosis not present

## 2024-12-19 DIAGNOSIS — R001 Bradycardia, unspecified: Secondary | ICD-10-CM | POA: Diagnosis not present

## 2024-12-19 DIAGNOSIS — I495 Sick sinus syndrome: Secondary | ICD-10-CM | POA: Diagnosis not present

## 2024-12-19 DIAGNOSIS — E860 Dehydration: Secondary | ICD-10-CM | POA: Diagnosis not present

## 2024-12-19 DIAGNOSIS — R627 Adult failure to thrive: Secondary | ICD-10-CM | POA: Diagnosis not present

## 2024-12-19 DIAGNOSIS — Z515 Encounter for palliative care: Secondary | ICD-10-CM | POA: Diagnosis not present

## 2024-12-19 DIAGNOSIS — J90 Pleural effusion, not elsewhere classified: Secondary | ICD-10-CM | POA: Diagnosis not present

## 2024-12-19 DIAGNOSIS — R531 Weakness: Secondary | ICD-10-CM | POA: Diagnosis not present

## 2024-12-19 DIAGNOSIS — Z7189 Other specified counseling: Secondary | ICD-10-CM | POA: Diagnosis not present

## 2024-12-19 LAB — RENAL FUNCTION PANEL
Albumin: 3.4 g/dL — ABNORMAL LOW (ref 3.5–5.0)
Anion gap: 7 (ref 5–15)
BUN: 43 mg/dL — ABNORMAL HIGH (ref 8–23)
CO2: 31 mmol/L (ref 22–32)
Calcium: 8.4 mg/dL — ABNORMAL LOW (ref 8.9–10.3)
Chloride: 102 mmol/L (ref 98–111)
Creatinine, Ser: 1.32 mg/dL — ABNORMAL HIGH (ref 0.61–1.24)
GFR, Estimated: 50 mL/min — ABNORMAL LOW
Glucose, Bld: 102 mg/dL — ABNORMAL HIGH (ref 70–99)
Phosphorus: 4.7 mg/dL — ABNORMAL HIGH (ref 2.5–4.6)
Potassium: 4.4 mmol/L (ref 3.5–5.1)
Sodium: 140 mmol/L (ref 135–145)

## 2024-12-19 LAB — POCT I-STAT EG7
Acid-Base Excess: 5 mmol/L — ABNORMAL HIGH (ref 0.0–2.0)
Acid-Base Excess: 5 mmol/L — ABNORMAL HIGH (ref 0.0–2.0)
Bicarbonate: 32.8 mmol/L — ABNORMAL HIGH (ref 20.0–28.0)
Bicarbonate: 33.2 mmol/L — ABNORMAL HIGH (ref 20.0–28.0)
Calcium, Ion: 1.22 mmol/L (ref 1.15–1.40)
Calcium, Ion: 1.22 mmol/L (ref 1.15–1.40)
HCT: 29 % — ABNORMAL LOW (ref 39.0–52.0)
HCT: 31 % — ABNORMAL LOW (ref 39.0–52.0)
Hemoglobin: 10.5 g/dL — ABNORMAL LOW (ref 13.0–17.0)
Hemoglobin: 9.9 g/dL — ABNORMAL LOW (ref 13.0–17.0)
O2 Saturation: 75 %
O2 Saturation: 78 %
Patient temperature: 98.6
Potassium: 3.9 mmol/L (ref 3.5–5.1)
Potassium: 3.9 mmol/L (ref 3.5–5.1)
Sodium: 140 mmol/L (ref 135–145)
Sodium: 140 mmol/L (ref 135–145)
TCO2: 35 mmol/L — ABNORMAL HIGH (ref 22–32)
TCO2: 35 mmol/L — ABNORMAL HIGH (ref 22–32)
pCO2, Ven: 67.5 mmHg — ABNORMAL HIGH (ref 44–60)
pCO2, Ven: 68.2 mmHg — ABNORMAL HIGH (ref 44–60)
pH, Ven: 7.294 (ref 7.25–7.43)
pH, Ven: 7.296 (ref 7.25–7.43)
pO2, Ven: 46 mmHg — ABNORMAL HIGH (ref 32–45)
pO2, Ven: 48 mmHg — ABNORMAL HIGH (ref 32–45)

## 2024-12-19 LAB — DIC (DISSEMINATED INTRAVASCULAR COAGULATION)PANEL
D-Dimer, Quant: <0.27 ug{FEU}/mL (ref 0.00–0.50)
Fibrinogen: 329 mg/dL (ref 210–475)
INR: 1.5 — ABNORMAL HIGH (ref 0.8–1.2)
Platelets: 74 K/uL — ABNORMAL LOW (ref 150–400)
Prothrombin Time: 19.3 s — ABNORMAL HIGH (ref 11.4–15.2)
Smear Review: NONE SEEN
aPTT: 51 s — ABNORMAL HIGH (ref 24–36)

## 2024-12-19 LAB — CBC
HCT: 32.3 % — ABNORMAL LOW (ref 39.0–52.0)
Hemoglobin: 10.1 g/dL — ABNORMAL LOW (ref 13.0–17.0)
MCH: 30 pg (ref 26.0–34.0)
MCHC: 31.3 g/dL (ref 30.0–36.0)
MCV: 95.8 fL (ref 80.0–100.0)
Platelets: 73 K/uL — ABNORMAL LOW (ref 150–400)
RBC: 3.37 MIL/uL — ABNORMAL LOW (ref 4.22–5.81)
RDW: 14.9 % (ref 11.5–15.5)
WBC: 19.7 K/uL — ABNORMAL HIGH (ref 4.0–10.5)
nRBC: 0.2 % (ref 0.0–0.2)

## 2024-12-19 LAB — BODY FLUID CELL COUNT WITH DIFFERENTIAL
Lymphs, Fluid: 37 %
Monocyte-Macrophage-Serous Fluid: 60 % (ref 50–90)
Neutrophil Count, Fluid: 3 % (ref 0–25)
Total Nucleated Cell Count, Fluid: 300 uL (ref 0–1000)

## 2024-12-19 LAB — MAGNESIUM: Magnesium: 2.2 mg/dL (ref 1.7–2.4)

## 2024-12-19 MED ORDER — THIAMINE HCL 100 MG/ML IJ SOLN
500.0000 mg | Freq: Three times a day (TID) | INTRAVENOUS | Status: DC
  Administered 2024-12-19 (×2): 500 mg via INTRAVENOUS
  Filled 2024-12-19 (×4): qty 5
  Filled 2024-12-19: qty 466.67
  Filled 2024-12-19: qty 5

## 2024-12-19 MED ORDER — ORAL CARE MOUTH RINSE
15.0000 mL | OROMUCOSAL | Status: DC
  Administered 2024-12-19 (×3): 15 mL via OROMUCOSAL

## 2024-12-19 MED ORDER — ORAL CARE MOUTH RINSE: 15.0000 mL | OROMUCOSAL | Status: DC | PRN

## 2024-12-19 MED ORDER — SODIUM CHLORIDE 0.9 % IV SOLN: INTRAVENOUS | Status: AC

## 2024-12-19 MED ORDER — THIAMINE HCL 100 MG/ML IJ SOLN: 100.0000 mg | INTRAMUSCULAR | Status: DC

## 2024-12-19 NOTE — Progress Notes (Signed)
 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 EMS called d/t progressive weakness, change in mental status and bradycardia. Family reported he had not voided most of the day. Found have severe bradycardia to 20s, improved with atropine. Recurrent episodes after admission with asystole lasting up to 4 seconds s/p TVP.  Significant events:   12/18: Symptomatic bradycardia with asystolic episode up to 4 seconds. S/p TVP.  Subjective:    Has diuresed well overnight.   Remains v-paced at 60. Threshold 1mA  Feels very weak. + cough   Objective:   Weight Range: 67.3 kg Body mass index is 20.69 kg/m.   Vital Signs:   Temp:  [97.3 F (36.3 C)-98.6 F (37 C)] 98.6 F (37 C) (12/20 0530) Pulse Rate:  [36-74] 53 (12/20 1800) Resp:  [12-27] 24 (12/20 1800) BP: (84-130)/(43-77) 103/62 (12/20 1800) SpO2:  [84 %-100 %] 100 % (12/20 1800) Weight:  [67.3 kg] 67.3 kg (12/20 0500) Last BM Date :  (PTA)  Weight change: Filed Weights   12/17/24 2000 12/18/24 0500 12/19/24 0500  Weight: 67.9 kg 66.1 kg 67.3 kg    Intake/Output:   Intake/Output Summary (Last 24 hours) at 12/19/2024 1821 Last data filed at 12/19/2024 1800 Gross per 24 hour  Intake 412.03 ml  Output 1340 ml  Net -927.97 ml     Physical Exam   General:  Elderly. Thin male. Weak appearing. Sitting up in bed. No resp difficulty HEENT: normal Neck: supple. RIJ TVP Cor: Irregular rate & rhythm. No rubs, gallops or murmurs. Lungs: clear anteriorly  Abdomen: soft, nontender, nondistended.Good bowel sounds. Extremities: no cyanosis, clubbing, rash, edema Neuro: alert & orientedx3, cranial nerves grossly intact. moves all 4 extremities w/o difficulty. Affect pleasant/fatigued   Telemetry   V  paced 60 underlying rhythm AF 30-50s. Rate up to 60 with hand clapping   Labs   CBC Recent Labs    12/18/24 0201 12/19/24 0157 12/19/24 1051 12/19/24 1057  WBC 20.0* 19.7*  --   --   HGB 10.3* 10.1* 9.9* 10.5*  HCT 33.1* 32.3* 29.0* 31.0*  MCV 95.7 95.8  --   --   PLT 64* 74*  73*  --   --    Basic Metabolic Panel Recent Labs    87/81/74 0025 12/17/24 0903 12/18/24 0201 12/18/24 1639 12/19/24 0157 12/19/24 1051 12/19/24 1057  NA 138  --    < > 139 140 140 140  K 4.9  --    < > 4.2 4.4 3.9 3.9  CL 105  --    < > 101 102  --   --   CO2 25  --    < > 30 31  --   --   GLUCOSE 115*  --    < > 124* 102*  --   --   BUN 42*  --    < > 41* 43*  --   --   CREATININE 1.13  --    < > 1.29* 1.32*  --   --   CALCIUM  8.7*  --    < > 8.6* 8.4*  --   --   MG 2.3  --   --   --  2.2  --   --   PHOS  --  3.7  --   --  4.7*  --   --    < > = values in this interval not displayed.   Liver Function Tests Recent Labs    12/17/24 0025 12/18/24 0201 12/19/24 0157  AST 25 18  --   ALT 29 23  --   ALKPHOS 78 70  --   BILITOT 1.1 1.2  --   PROT 5.3* 4.8*  --   ALBUMIN 3.9 3.4* 3.4*   Recent Labs    12/17/24 0025  LIPASE 26   Cardiac Enzymes No results for input(s): CKTOTAL, CKMB, CKMBINDEX, TROPONINI in the last 72 hours.  BNP: BNP (last 3 results) No results for input(s): BNP in the last 8760 hours.  ProBNP (last 3 results) Recent Labs    12/16/24 1320  PROBNP 2,863.0*     D-Dimer Recent Labs    12/19/24 0157  DDIMER <0.27   Hemoglobin A1C No results for input(s): HGBA1C in the last 72 hours. Fasting Lipid Panel No results for input(s): CHOL, HDL, LDLCALC, TRIG, CHOLHDL, LDLDIRECT in the last 72 hours. Medications:   Scheduled Medications:  Chlorhexidine  Gluconate Cloth  6 each Topical Daily   dorzolamide   1 drop Left Eye BID   latanoprost   1 drop Both Eyes QHS   mouth rinse  15 mL Mouth Rinse 4 times per day   pantoprazole   40 mg  Oral Daily   polyethylene glycol  17 g Oral BID   [START ON 12/21/2024] thiamine  (VITAMIN B1) injection  100 mg Intravenous Q24H    Infusions:  sodium chloride  10 mL/hr at 12/19/24 1800   thiamine  (VITAMIN B1) injection Stopped (12/19/24 1430)    PRN Medications: acetaminophen  **OR** acetaminophen , melatonin, mouth rinse  Assessment/Plan   Bradycardia -Hx chronic bradycardia, rates typically 50s off AV nodal agents -I do not think that his bradycardia is the primary driver for failure to thrive picture or d/t combination of other medical issues -HR 20s when EMS arrived on 12/17, improved to 70s after atropine. Later had asystolic episodes up to 4 seconds in duration and underwent TVP 12/18. -Some concern that there may be vagally mediated component d/t urinary issues.  -Note hypothermia on presentation -Keep off donepezil  as it can exacerbate bradycardia  -He remains bradycardic but has HR response to activity. I think this is probably fairly close to his baseline. Ideally I think he may benefit from a PM but given overall decline and chronic urinary tract colonization I am not sure if the risk benefit of pacing is in his favor.  - I have turned pacer down to 30 to assess how much he will pace. EP consulted as well   Atrial fibrillation -Chronic -On Eliquis , currently on hold d/t hematuria - No change  Acute diastolic CHF Bilateral pleural effusions -In setting of severe bradycardia -Echo in 2021 w/ preserved EF  -Echo 12/18/24 EF 60-65% RV ok  -Diuresed well. Will stop lasix  for now. Re-dose as needed  Pericardial effusion -Small to moderate on CT -Small one cho   BPH Urinary retention  Hematuria -Bladder irrigation per Urology  Hypothermia -Treated empirically with merrem  for possible sepsis with merrem , no clear source of infection. PCT negative. Unable to interpret UA d/t blood. -Hx ESBL Klebsiella pneumoniae colonization that is likely not actionable  Pleural  effusions - CCM managing  Failure to thrive/weakness - Consult PT/OT  CRITICAL CARE Performed by: Cherrie Sieving  Total critical care time: 40 minutes  Critical care time was exclusive of  separately billable procedures and treating other patients.  Critical care was necessary to treat or prevent imminent or life-threatening deterioration.  Critical care was time spent personally by me (independent of midlevel providers or residents) on the following activities: development of treatment plan with patient and/or surrogate as well as nursing, discussions with consultants, evaluation of patient's response to treatment, examination of patient, obtaining history from patient or surrogate, ordering and performing treatments and interventions, ordering and review of laboratory studies, ordering and review of radiographic studies, pulse oximetry and re-evaluation of patient's condition.   Length of Stay: 3  Toribio Fuel, MD  12/19/2024, 6:21 PM  Advanced Heart Failure Team Pager [phone] (M-F; 7a - 5p)   Please visit [email or web address]: For overnight coverage please call cardiology fellow first. If fellow not available call Shock/ECMO MD on call.  For ECMO / Mechanical Support (Impella, IABP, LVAD) issues call Shock / ECMO MD on call.    P "

## 2024-12-19 NOTE — Progress Notes (Signed)
 OT Cancellation Note  Patient Details Name: Jared Coate, PhD MRN: [id number] DOB: Jun 19, 1929   Cancelled Treatment:    Reason Eval/Treat Not Completed: Patient at procedure or test/ unavailable (Pt currently undergoing thoracentesis. OT to reattempt to see pt at a later time as appropriate/available.)  Margarie Rockey HERO., OTR/L, MA Acute Rehab [phone]   Margarie FORBES Horns 12/19/2024, 4:18 PM

## 2024-12-19 NOTE — Progress Notes (Signed)
 Provided written table below to help family navigate through multiorgan dysfunction.  Head Waxing/waning mentation is common: brain protecting itself Trying high dose thiamine , sometimes helps with malnutrition associated mental status issues Eye drops adjusted as discussed   Heart Normal aging heart with reduced function and relatively slow beat initiation Electrophysiologist Valentine) and cardiologist (Bensihmon) can answer questions in this area  Underlying rhythm is atrial fibrillation; however, risks of ongoing anticoagulation is thought to be outweighed by bleeding in urine and fall risk   Lungs Large pleural effusions most likely from malnutrition Can drain these but likely to recur Procedure does carry some risks as we discussed CPAP at night may help in short term   GI Swallowing safety tips from speech therapist Needs to increase protein intake longer term if any hopes for recovery Likewise needs to have regular bowel movements, will keep eye on   GU/urinary Making urine, still a little bloody but not blocking anything Watching closely should ease with time  Nephrology Blood does have a little too much acid in it (acidemia) Draining fluid in lungs may (temporarily unk how fast will recur) improve acidemia Acidemia can also reduce level of consciousness (hypercarbia)   Heme Hemoglobin stable Platelets dropped a little from bleeding but now have stabilized Again, holding anticoagulation for now   Musculoskeletal Deconditioned, this will take weeks at best to improve, if it can  ID While he was hypothermic (low temp) on admission, he has no signs or symptoms of infection One of the blood markers that is very sensitive for infection was negative   Overall Multi-organ dysfunction with many challenges ahead There is fair chance he won't get better If progression and he appears to be suffering we may need to consider quality over quantity of life

## 2024-12-19 NOTE — Consult Note (Signed)
 " Electrophysiology Consultation:   Patient ID: Jared Soth, PhD MRN: [id number]; DOB: [birth year]  Admit date: 12/16/2024 Date of Consult: 12/19/2024  Primary Care Provider: [name], MD [facility] Cardiologist: Maude Emmer, MD  [facility] Electrophysiologist:  Adina Primus, MD   Patient Profile:   Jared Mannan, PhD is a 89 y.o. male with a hx of permanent AF, SSS, prior CVA, HTN, COPD, mild dementia, BPH who is being seen today for the evaluation of bradycardia and weakness at the request of Dr. Cherrie.  History of Present Illness:   [name] presented with progressive weakness, mild AMS and bradycardia.  His family reported that he had not been urinating for most of the day and after admission urology was consulted for Foley exchange and irrigation after he had some hematuria after initial Foley placement.  During that catheter exchange she had significant bradycardia and ultimately underwent TVP placement for profound bradycardia (RR 4 seconds, HR in the 10-20s for brief periods).   I reviewed his current ADLs at length with his family at bedside.  He has become progressively more weak over the past several months and there has been a significant functional decline.  They attribute part of this to medication changes and think that the addition of Aricept  and increased dose of Norvasc  is a think that his decline may years these medication changes.  He was seen by Dr. Emmer on 12/10/24 and he had recommended stopping Aricept  if he continued to have significant bradycardia as well as to taper off of the Norvasc .  Past Medical History:  Diagnosis Date   Acute, but ill-defined, cerebrovascular disease 04/07/2014   Adenomatous colon polyp    Allergy    Atrial fibrillation (HCC)    BPH (benign prostatic hyperplasia)    Cataract    removed both eyes    Community acquired pneumonia 03/05/2022   COPD (chronic obstructive pulmonary disease) (HCC)    Eczema     Erectile dysfunction    Gallstones    GERD (gastroesophageal reflux disease)    Heart murmur    HOH (hard of hearing)    HTN (hypertension)    Meningioma (HCC)    Right frontal   Meralgia paraesthetica    Palpitations    Urinary retention    Past Surgical History:  Procedure Laterality Date   BLADDER DIVERTICULECTOMY  02/15/2003   thelbert 05/16/2011   CARDIAC CATHETERIZATION     CARDIOVERSION N/A 06/17/2020   Procedure: CARDIOVERSION;  Surgeon: Emmer [name], MD;  Location: [facility] ENDOSCOPY;  Service: Cardiovascular;  Laterality: N/A;   CATARACT EXTRACTION Bilateral 2013   CHOLECYSTECTOMY N/A 11/19/2014   Procedure: LAPAROSCOPIC CHOLECYSTECTOMY WITH INTRAOPERATIVE CHOLANGIOGRAM;  Surgeon: Krystal Russell, MD;  Location: [facility] OR;  Service: General;  Laterality: N/A;   COLONOSCOPY     COLONOSCOPY W/ POLYPECTOMY     GUM SURGERY  1999   INGUINAL HERNIA REPAIR Left    INGUINAL HERNIA REPAIR Right 11/2009   LAPAROSCOPIC CHOLECYSTECTOMY  11/19/2014   w/IOC   MEATOTOMY  06/2003   Distal urethral meatotomy with YAG laser/notes 05/16/2011   POLYPECTOMY     SUPRAPUBIC PROSTATECTOMY  02/15/2003   thelbert 05/16/2011   TEMPORARY PACEMAKER N/A 12/17/2024   Procedure: TEMPORARY PACEMAKER;  Surgeon: Wonda Sharper, MD;  Location: [facility] INVASIVE CV LAB;  Service: Cardiovascular;  Laterality: N/A;    Home Medications:  Prior to Admission medications  Medication Sig Start Date End Date Taking? Authorizing Provider  acetaminophen  (TYLENOL ) 500 MG tablet Take 500 mg  by mouth every 6 (six) hours as needed for mild pain, moderate pain or fever.   Yes [provider]  amLODipine  (NORVASC ) 10 MG tablet Take 10 mg by mouth daily.   Yes [provider]  ammonium lactate (LAC-HYDRIN) 12 % lotion Apply 1 Application topically at bedtime. Apply to feet   Yes [provider]  ciclopirox (LOPROX) 0.77 % cream Apply 1 application topically daily. 01/20/20  Yes [provider]   dorzolamide -timolol  (COSOPT ) 22.3-6.8 MG/ML ophthalmic solution Place 1 drop into the left eye 2 (two) times daily. 11/21/21  Yes [provider]  ELIQUIS  5 MG TABS tablet TAKE 1 TABLET(5 MG) BY MOUTH TWICE DAILY 07/07/24  Yes Nishan, Peter C, MD  esomeprazole  (NEXIUM ) 20 MG capsule Take 20 mg by mouth daily. 12/21/21  Yes [provider]  ketotifen  (ALAWAY ) 0.035 % ophthalmic solution Place 1 drop into both eyes daily.   Yes [provider]  latanoprost  (XALATAN ) 0.005 % ophthalmic solution Place 1 drop into both eyes at bedtime. 12/14/19  Yes [provider]  melatonin (MELATONIN MAXIMUM STRENGTH) 5 MG TABS Take 1 tablet (5 mg total) by mouth every evening. Patient taking differently: Take 5 mg by mouth daily as needed (sleep). 11/07/23  Yes   terazosin  (HYTRIN ) 10 MG capsule Take 10 mg by mouth at bedtime.  11/21/12  Yes [provider]  zinc oxide (ENDIT) 20 % ointment Apply 1 Application topically at bedtime. Apply I application to bottom and right hip to diaper rash   Yes [provider]  donepezil  (ARICEPT ) 10 MG tablet Take 10 mg by mouth at bedtime. 11/23/24   [provider]   Inpatient Medications: Scheduled Meds:  Chlorhexidine  Gluconate Cloth  6 each Topical Daily   dorzolamide   1 drop Left Eye BID   latanoprost   1 drop Both Eyes QHS   mouth rinse  15 mL Mouth Rinse 4 times per day   pantoprazole   40 mg Oral Daily   polyethylene glycol  17 g Oral BID   Continuous Infusions:  PRN Meds: acetaminophen  **OR** acetaminophen , melatonin, mouth rinse  Allergies:   Allergies[1]  Social History:   Social History   Socioeconomic History   Marital status: Divorced    Spouse name: Not on file   Number of children: 2   Years of education: Ph. D   Highest education level: Not on file  Occupational History   Occupation: Retired-psychologist  Tobacco Use   Smoking status: Former    Current packs/day: 0.00    Average  packs/day: 0.2 packs/day for 40.0 years (8.0 ttl pk-yrs)    Types: Cigarettes    Start date: 11/12/1959    Quit date: 11/12/1999    Years since quitting: 25.1   Smokeless tobacco: Never  Vaping Use   Vaping status: Never Used  Substance and Sexual Activity   Alcohol  use: Not Currently    Comment: occ   Drug use: No   Sexual activity: Not Currently  Other Topics Concern   Not on file  Social History Narrative   Not on file   Social Drivers of Health   Tobacco Use: Medium Risk (12/16/2024)   Patient History    Smoking Tobacco Use: Former    Smokeless Tobacco Use: Never    Passive Exposure: Not on Actuary Strain: Not on file  Food Insecurity: Not on file  Transportation Needs: Not on file  Physical Activity: Not on file  Stress: Not on file  Social Connections: Not on file  Intimate Partner Violence: Not on file  Depression (PHQ2-9): Not on file  Alcohol  Screen: Not on file  Housing: Not on file  Utilities: Not on file  Health Literacy: Not on file    Family History:    Family History  Problem Relation Age of Onset   Alzheimer's disease Mother    Heart attack Father    Cancer - Lung Sister    Colon cancer Neg Hx    Colon polyps Neg Hx    Esophageal cancer Neg Hx    Rectal cancer Neg Hx    Stomach cancer Neg Hx     ROS:  Review of Systems: [y] = yes, [ ]  = no      General: Weight gain [ ] ; Weight loss [y]; Anorexia [ ] ; Fatigue [y]; Fever [ ] ; Chills [ ] ; Weakness [y]   Cardiac: Chest pain/pressure [ ] ; Resting SOB [ ] ; Exertional SOB [ ] ; Orthopnea [ ] ; Pedal Edema [ ] ; Palpitations [ ] ; Syncope [ ] ; Presyncope [ ] ; Paroxysmal nocturnal dyspnea [ ]    Pulmonary: Cough [ ] ; Wheezing [ ] ; Hemoptysis [ ] ; Sputum [ ] ; Snoring [ ]    GI: Vomiting [ ] ; Dysphagia [ ] ; Melena [ ] ; Hematochezia [ ] ; Heartburn [ ] ; Abdominal pain [ ] ; Constipation [ ] ; Diarrhea [ ] ; BRBPR [ ]    GU: Hematuria [ ] ; Dysuria [ ] ; Nocturia [ ]  Vascular: Pain in legs with  walking [ ] ; Pain in feet with lying flat [ ] ; Non-healing sores [ ] ; Stroke [ ] ; TIA [ ] ; Slurred speech [ ] ;   Neuro: Headaches [ ] ; Vertigo [ ] ; Seizures [ ] ; Paresthesias [ ] ;Blurred vision [ ] ; Diplopia [ ] ; Vision changes [ ]    Ortho/Skin: Arthritis [ ] ; Joint pain [ ] ; Muscle pain [ ] ; Joint swelling [ ] ; Back Pain [ ] ; Rash [ ]    Psych: Depression [ ] ; Anxiety [ ]    Heme: Bleeding problems [ ] ; Clotting disorders [ ] ; Anemia [ ]    Endocrine: Diabetes [ ] ; Thyroid  dysfunction [ ]    Physical Exam/Data:   Vitals:   12/19/24 0730 12/19/24 0800 12/19/24 0802 12/19/24 0900  BP:  (!) 97/53  (!) 96/50  Pulse: (!) 51 70 68 (!) 58  Resp: 18 13 (!) 23 19  Temp:      TempSrc:      SpO2: 100% 100% 100% 98%  Weight:      Height:       Intake/Output Summary (Last 24 hours) at 12/19/2024 1020 Last data filed at 12/19/2024 0944 Gross per 24 hour  Intake 30 ml  Output 1580 ml  Net -1550 ml      12/19/2024    5:00 AM 12/18/2024    5:00 AM 12/17/2024    8:00 PM  Last 3 Weights  Weight (lbs) 148 lb 5.9 oz 145 lb 11.6 oz 149 lb 11.1 oz  Weight (kg) 67.3 kg 66.1 kg 67.9 kg     Body mass index is 20.69 kg/m.  General: frail, intermittently conversant, falls asleep mid conversation  HEENT: normal Neck: no JVD Vascular: No carotid bruits; FA pulses 2+ bilaterally without bruits  Cardiac: slow rate, regular rhythm, no M/G/R Lungs: clear to auscultation bilaterally, no wheezing, rhonchi or rales  Abd: soft, nontender, no hepatomegaly  Ext: no edema Musculoskeletal:  No deformities, BUE and BLE strength normal and equal Skin: warm and dry  Neuro:  CNs 2-12 intact, no  focal abnormalities noted Psych:  Normal affect   EKG review: 12/17/24: AF/SVR 59, QRS 90, QT/c 475/471 12/16/24: (09:55:53) AF/SVR 59, QRS 101, QT/c 529/525 12/16/24: (09:41:45) AF/VR 64, QRS 114, QT/c 504/521 12/10/24: AF/SVR 56, QRS 88, QT/c 270/260  03/06/22: AF/VR 83, QRS 86, QT/c 398/467, isolated PVC 03/05/22:  AF/VR 97, QRS 86, QT/c 354/450, ventricular trigeminy-bigeminy (PVC with LBBB V1 and V3 transition, inferior axis, possibly LCC origin)   Telemetry:  Telemetry was personally reviewed and demonstrates: VP 60s prior to TVP dropped to VVI 30 backup, AF/SVR 30-60s, occasionally drops to the 20s for brief periods of time, permanent AF with variable RR, no post conversion pauses as he stays in AF  Relevant CV Studies:  TTE Result date: 12/18/24  1. Left ventricular ejection fraction, by estimation, is 60 to 65%. The  left ventricle has normal function. The left ventricle has no regional  wall motion abnormalities. There is mild left ventricular hypertrophy.  Left ventricular diastolic parameters  are indeterminate.   2. Right ventricular systolic function is normal. The right ventricular  size is mildly enlarged. There is mildly elevated pulmonary artery  systolic pressure. The estimated right ventricular systolic pressure is  44.4 mmHg.   3. Left atrial size was moderately dilated.   4. Right atrial size was moderately dilated.   5. A small pericardial effusion is present. Large pleural effusion in the  left lateral region.   6. The mitral valve is normal in structure. Trivial mitral valve  regurgitation. No evidence of mitral stenosis.   7. The aortic valve is tricuspid. Aortic valve regurgitation is not  visualized. No aortic stenosis is present.   8. Aortic dilatation noted. There is mild dilatation of the ascending  aorta, measuring 39 mm.   9. The inferior vena cava is dilated in size with <50% respiratory  variability, suggesting right atrial pressure of 15 mmHg.   Laboratory Data:  Chemistry Recent Labs  Lab 12/18/24 0201 12/18/24 1639 12/19/24 0157  NA 140 139 140  K 4.3 4.2 4.4  CL 104 101 102  CO2 28 30 31   GLUCOSE 99 124* 102*  BUN 41* 41* 43*  CREATININE 1.11 1.29* 1.32*  CALCIUM  8.8* 8.6* 8.4*  GFRNONAA >60 52* 50*  ANIONGAP 7 8 7     Recent Labs  Lab  12/16/24 1020 12/17/24 0025 12/18/24 0201 12/19/24 0157  PROT 5.2* 5.3* 4.8*  --   ALBUMIN 3.8 3.9 3.4* 3.4*  AST 26 25 18   --   ALT 28 29 23   --   ALKPHOS 73 78 70  --   BILITOT 0.9 1.1 1.2  --    Hematology Recent Labs  Lab 12/17/24 0025 12/18/24 0201 12/19/24 0157  WBC 15.9* 20.0* 19.7*  RBC 3.52* 3.46* 3.37*  HGB 10.7* 10.3* 10.1*  HCT 33.8* 33.1* 32.3*  MCV 96.0 95.7 95.8  MCH 30.4 29.8 30.0  MCHC 31.7 31.1 31.3  RDW 14.6 14.9 14.9  PLT 79* 64* 74*  73*   BNP Recent Labs  Lab 12/16/24 1320  PROBNP 2,863.0*    DDimer  Recent Labs  Lab 12/19/24 0157  DDIMER <0.27   Radiology/Studies:  ECHOCARDIOGRAM COMPLETE Result Date: 12/18/2024    ECHOCARDIOGRAM REPORT   Patient Name:   NYGEL PROKOP Date of Exam: 12/18/2024 Medical Rec #:  [id number]      Height:       71.0 in Accession #:    [id number]     Weight:  145.7 lb Date of Birth:  [birth year]      BSA:          1.843 m Patient Age:    89 years       BP:           114/68 mmHg Patient Gender: M              HR:           60 bpm. Exam Location:  Inpatient Procedure: 2D Echo, Cardiac Doppler, Color Doppler and Intracardiac            Opacification Agent (Both Spectral and Color Flow Doppler were            utilized during procedure). Indications:    Elevated brain natriuretic peptide (BNP) levels; symptomatic                 bradycardia  History:        Patient has prior history of Echocardiogram examinations, most                 recent 08/18/2020. Pacemaker, COPD and Stroke, Arrythmias:Atrial                 Fibrillation; Risk Factors:Hypertension and Former Smoker.  Sonographer:    Merlynn Argyle Referring Phys: [id number] MARSA NOVAK MELVIN  Sonographer Comments: Technically difficult study due to poor echo windows and suboptimal apical window. IMPRESSIONS  1. Left ventricular ejection fraction, by estimation, is 60 to 65%. The left ventricle has normal function. The left ventricle has no regional wall motion abnormalities.  There is mild left ventricular hypertrophy. Left ventricular diastolic parameters are indeterminate.  2. Right ventricular systolic function is normal. The right ventricular size is mildly enlarged. There is mildly elevated pulmonary artery systolic pressure. The estimated right ventricular systolic pressure is 44.4 mmHg.  3. Left atrial size was moderately dilated.  4. Right atrial size was moderately dilated.  5. A small pericardial effusion is present. Large pleural effusion in the left lateral region.  6. The mitral valve is normal in structure. Trivial mitral valve regurgitation. No evidence of mitral stenosis.  7. The aortic valve is tricuspid. Aortic valve regurgitation is not visualized. No aortic stenosis is present.  8. Aortic dilatation noted. There is mild dilatation of the ascending aorta, measuring 39 mm.  9. The inferior vena cava is dilated in size with <50% respiratory variability, suggesting right atrial pressure of 15 mmHg. FINDINGS  Left Ventricle: Left ventricular ejection fraction, by estimation, is 60 to 65%. The left ventricle has normal function. The left ventricle has no regional wall motion abnormalities. Definity  contrast agent was given IV to delineate the left ventricular  endocardial borders. The left ventricular internal cavity size was normal in size. There is mild left ventricular hypertrophy. Left ventricular diastolic parameters are indeterminate. Right Ventricle: The right ventricular size is mildly enlarged. No increase in right ventricular wall thickness. Right ventricular systolic function is normal. There is mildly elevated pulmonary artery systolic pressure. The tricuspid regurgitant velocity is 2.71 m/s, and with an assumed right atrial pressure of 15 mmHg, the estimated right ventricular systolic pressure is 44.4 mmHg. Left Atrium: Left atrial size was moderately dilated. Right Atrium: Right atrial size was moderately dilated. Pericardium: A small pericardial effusion is  present. Mitral Valve: The mitral valve is normal in structure. Trivial mitral valve regurgitation. No evidence of mitral valve stenosis. Tricuspid Valve: The tricuspid valve is normal in structure. Tricuspid valve regurgitation is trivial.  Aortic Valve: The aortic valve is tricuspid. Aortic valve regurgitation is not visualized. No aortic stenosis is present. Pulmonic Valve: The pulmonic valve was not well visualized. Pulmonic valve regurgitation is not visualized. Aorta: The aortic root is normal in size and structure and aortic dilatation noted. There is mild dilatation of the ascending aorta, measuring 39 mm. Venous: The inferior vena cava is dilated in size with less than 50% respiratory variability, suggesting right atrial pressure of 15 mmHg. IAS/Shunts: The interatrial septum was not well visualized. Additional Comments: There is a large pleural effusion in the left lateral region.  LEFT VENTRICLE PLAX 2D LVIDd:         5.10 cm   Diastology LVIDs:         3.30 cm   LV e' lateral:   10.50 cm/s LV PW:         1.30 cm   LV E/e' lateral: 8.5 LV IVS:        1.20 cm LVOT diam:     2.10 cm LV SV:         49 LV SV Index:   26 LVOT Area:     3.46 cm LV IVRT:       88 msec  RIGHT VENTRICLE          IVC RV Basal diam:  4.20 cm  IVC diam: 2.70 cm RV Mid diam:    3.00 cm TAPSE (M-mode): 1.3 cm LEFT ATRIUM             Index        RIGHT ATRIUM           Index LA diam:        5.50 cm 2.98 cm/m   RA Area:     25.90 cm LA Vol (A2C):   91.5 ml 49.64 ml/m  RA Volume:   80.70 ml  43.78 ml/m LA Vol (A4C):   83.1 ml 45.09 ml/m LA Biplane Vol: 89.2 ml 48.40 ml/m  AORTIC VALVE LVOT Vmax:   56.80 cm/s LVOT Vmean:  40.900 cm/s LVOT VTI:    0.141 m  AORTA Ao Root diam: 3.80 cm Ao Asc diam:  3.90 cm MITRAL VALVE               TRICUSPID VALVE MV Area (PHT): 3.68 cm    TR Peak grad:   29.4 mmHg MV Decel Time: 206 msec    TR Vmax:        271.00 cm/s MR Peak grad: 72.4 mmHg MR Mean grad: 48.0 mmHg    SHUNTS MR Vmax:      425.50  cm/s  Systemic VTI:  0.14 m MR Vmean:     322.0 cm/s   Systemic Diam: 2.10 cm MV E velocity: 89.50 cm/s MV A velocity: 38.10 cm/s MV E/A ratio:  2.35 Lonni Nanas MD Electronically signed by Lonni Nanas MD Signature Date/Time: 12/18/2024/9:45:58 AM    Final    CARDIAC CATHETERIZATION Result Date: 12/17/2024 Successful placement of a right internal jugular transvenous pacing catheter with capture threshold 0.6 mA.  Pacemaker set at 70 bpm, output 5 mA.  Patient catheter secured in place at 38 cm.   CT CHEST WO CONTRAST Result Date: 12/17/2024 CLINICAL DATA:  Shortness of breath (Ped 0-17y) Concern for aspiration pneumonia EXAM: CT CHEST WITHOUT CONTRAST TECHNIQUE: Multidetector CT imaging of the chest was performed following the standard protocol without IV contrast. RADIATION DOSE REDUCTION: This exam was performed according to the departmental dose-optimization program which  includes automated exposure control, adjustment of the mA and/or kV according to patient size and/or use of iterative reconstruction technique. COMPARISON:  December 16, 2024 FINDINGS: Cardiovascular: Mild cardiomegaly. Small to moderate volume pericardial effusion. Scattered multi-vessel coronary atherosclerosis. No aortic aneurysm. Scattered aortic atherosclerosis. Mediastinum/Nodes: No mediastinal mass.Mildly enlarged multistation mediastinal lymph nodes measuring up to 1.1 cm, likely reactive. No axillary or hilar lymphadenopathy. Lungs/Pleura: The midline trachea and bronchi are patent. Moderate volume bilateral pleural effusions, larger on the right than the left with compressive atelectasis in both upper and lower lobes. No pneumothorax. Minimal intralobular septal thickening noted predominantly in the upper lobes bilaterally. Musculoskeletal: No acute fracture or destructive bone lesion. Moderate joint space loss of both shoulders, likely degenerative. Loose bodies in the left shoulder joint. Multilevel  degenerative disc disease of the spine. Upper Abdomen: No acute abnormality in the partially visualized upper abdomen. Similar appearing splenomegaly. IMPRESSION: 1. Cardiomegaly with minimal intralobular septal thickening noted in the lungs, possibly reflecting early interstitial edema. Moderate volume bilateral pleural effusions, larger on the right than the left, with compressive atelectasis in the lungs. 2. Mildly enlarged multistation mediastinal lymph nodes, measuring up to 1.1 cm, likely reactive. 3. Small to moderate volume pericardial effusion. Aortic Atherosclerosis (ICD10-I70.0). Electronically Signed   By: Rogelia Myers M.D.   On: 12/17/2024 10:56   MR BRAIN WO CONTRAST Result Date: 12/16/2024 EXAM: MRI BRAIN WITHOUT CONTRAST 12/16/2024 04:57:39 PM TECHNIQUE: Multiplanar multisequence MRI of the head/brain was performed without the administration of intravenous contrast. COMPARISON: Head CT 12/16/2024 and MRI 12/13/2024. CLINICAL HISTORY: Neuro deficit, acute, stroke suspected; Transient ischemic attack (TIA). Worsening weakness and slowed speech. FINDINGS: LIMITATIONS: The examination is mildly motion degraded. BRAIN AND VENTRICLES: No acute infarct, midline shift, extra-axial fluid collection, or hydrocephalus is identified. A small focus of chronic hemorrhage is again noted in the left temporo-occipital region along the medial margin of the atrium of the left lateral ventricle. Scattered small T2 hyperintensities in the cerebral white matter bilaterally are unchanged and nonspecific but compatible with mild chronic small vessel ischemic disease. There is a small chronic right cerebellar infarct. There is mild cerebral atrophy. A 1.7 cm extra-axial mass in the left middle cranial fossa and smaller extra-axial masses along the anterior falx and right frontoparietal convexity are all unchanged from the recent prior MRI and are without significant associated mass effect or brain edema. Major  intracranial vascular flow voids are preserved. ORBITS: Bilateral cataract extraction. SINUSES AND MASTOIDS: Trace left mastoid fluid. Clear paranasal sinuses. BONES AND SOFT TISSUES: Normal marrow signal. Unchanged 2.2 x 1.1 cm superficial cystic focus in the left preauricular region. IMPRESSION: 1. No acute intracranial abnormality. 2. 4 unchanged extra-axial masses compatible with meningiomas. No significant mass effect or brain edema. 3. Mild chronic small vessel ischemic disease. 4. Small chronic right cerebellar infarct. Electronically signed by: Dasie Hamburg MD 12/16/2024 05:23 PM EST RP Workstation: [id number]   DG Chest Portable 1 View Result Date: 12/16/2024 CLINICAL DATA:  Pleural effusions versus infection. Weakness 4 days. EXAM: PORTABLE CHEST 1 VIEW COMPARISON:  10/25/2024 FINDINGS: Lungs are adequately inflated demonstrate hazy bilateral perihilar and bibasilar opacification likely mild interstitial edema with possible small layering effusions. Infection is less likely. Cardiomediastinal silhouette and remainder of the exam is unchanged. IMPRESSION: Hazy bilateral perihilar and bibasilar opacification likely mild interstitial edema with possible small layering effusions. Infection is less likely. Electronically Signed   By: Toribio Agreste M.D.   On: 12/16/2024 13:57   CT ABDOMEN PELVIS W CONTRAST  Result Date: 12/16/2024 CLINICAL DATA:  Abdominal pain and gross hematuria with abdominal distension. EXAM: CT ABDOMEN AND PELVIS WITH CONTRAST TECHNIQUE: Multidetector CT imaging of the abdomen and pelvis was performed using the standard protocol following bolus administration of intravenous contrast. RADIATION DOSE REDUCTION: This exam was performed according to the departmental dose-optimization program which includes automated exposure control, adjustment of the mA and/or kV according to patient size and/or use of iterative reconstruction technique. CONTRAST:  75mL OMNIPAQUE  IOHEXOL  350 MG/ML SOLN  COMPARISON:  07/07/2020 FINDINGS: Lower chest: Borderline cardiomegaly. Moderate size hiatal hernia unchanged. Lung bases demonstrate bilateral moderate size pleural effusions with associated bibasilar airspace density which may be atelectasis versus infection. Hepatobiliary: Prior cholecystectomy. Mild diffuse low-attenuation of the liver compatible with a degree of steatosis or possible cirrhosis as there is minimal nodular contour to the liver. No focal liver mass. Biliary tree is unremarkable. Pancreas: 2.5 cm simple cystic structure over the head of the pancreas which is new. Somewhat ill-defined 1.3 cm low-attenuation over the more distal aspect of the pancreatic head unchanged. Fatty atrophy of the pancreas. Spleen: Mild stable splenomegaly. Adrenals/Urinary Tract: Adrenal glands are normal. Kidneys are normal in size. No evidence of nephrolithiasis. Bilateral renal cysts unchanged. Likely parapelvic left renal cysts without significant change. Visualized ureters and bladder are unremarkable. Stomach/Bowel: Stomach is within normal. There is mild stranding of the fat anterior to the body of the stomach. Mild wall thickening of the first and second portion of the duodenal C sweep with adjacent free fluid. Findings may be due to duodenitis/peptic ulcer disease. Appendix not well visualized. Diverticulosis over the distal descending and sigmoid colon. No evidence of active inflammation. Vascular/Lymphatic: Calcified plaque over the abdominal aorta. Possible focal dissection of the infrarenal abdominal aorta as the aorta is otherwise normal in caliber. No adenopathy. Reproductive: Stable enlargement of the prostate with impression upon the bladder base. Other: Mild-to-moderate free fluid over the pelvis which is new. Minimal free fluid over the pericolic gutters. Musculoskeletal: No focal abnormality. Degenerative changes of the spine and hips. IMPRESSION: 1. Mild wall thickening of the first and second portion  of the duodenal C sweep with adjacent free fluid. Findings may be due to duodenitis/peptic ulcer disease. 2. New 2.5 cm simple cystic structure over the head of the pancreas. This may represent a pseudocyst or or cyst and less likely low-grade cystic neoplasm. Stable 1.3 cm low-attenuation over the more distal aspect of the pancreatic head. Recommend follow-up CT 2 years. 3. Mild diffuse low-attenuation of the liver compatible with a degree of steatosis or possible cirrhosis. Mild stable splenomegaly. Minimal ascites. 4. Colonic diverticulosis without active inflammation. 5. Table enlargement of the prostate with impression upon the bladder base. 6. Moderate size bilateral pleural effusions with associated bibasilar airspace density which may be atelectasis versus infection. 7. Aortic atherosclerosis. Possible focal dissection of the infrarenal abdominal aorta as the aorta is otherwise normal in caliber. Aortic Atherosclerosis (ICD10-I70.0). Electronically Signed   By: Toribio Agreste M.D.   On: 12/16/2024 12:51   CT Head Wo Contrast Result Date: 12/16/2024 EXAM: CT HEAD WITHOUT CONTRAST 12/16/2024 10:40:49 AM TECHNIQUE: CT of the head was performed without the administration of intravenous contrast. Automated exposure control, iterative reconstruction, and/or weight based adjustment of the mA/kV was utilized to reduce the radiation dose to as low as reasonably achievable. COMPARISON: MRI of the head dated 12/13/2024. CLINICAL HISTORY: Mental status change, unknown cause. FINDINGS: BRAIN AND VENTRICLES: No acute hemorrhage. No evidence of acute infarct. No hydrocephalus.  No extra-axial collection. No mass effect or midline shift. Moderate chronic microvascular ischemic change sequela. Calcified meningioma along right frontal convexity. ORBITS: Bilateral lens replacement. SINUSES: No acute abnormality. SOFT TISSUES AND SKULL: No acute soft tissue abnormality. No skull fracture. IMPRESSION: 1. No acute intracranial  abnormality. 2. Moderate chronic microvascular ischemic change sequela. 3. Calcified meningioma along right frontal convexity. 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  He was only intermittently interactive during our discussion but his family had great questions about balancing the risk and benefits of PPM implant.  They understand that the indication for pacing would be either evidence of high degree AV block and/or signs of symptomatic bradycardia.  He has no evidence of infrahisian conduction disease (QRS 101 ms).  I do not think that his current fatigue is secondary to brief episodes of AF/SVR as he had no difference in mental status when being VP at 60-80 via TVP.  The only other reason to consider PPM would be if physical therapy and rehab was limited by his ability to augment his heart rate.  He was able to increase his VR into the 70s with minimal activity today in bed so I doubt that this is going to be a problem.  For now we will leave the TVP at VVI 30 and see if he uses it with any degree of frequency.  I suspect that his VR will remain in the mid 40s.  I explained that if he were in overall better condition then I would consider PPM for management of SSS however I do not know right now if the benefit of pacing at 60 bpm would be worth the procedural risk of a single-chamber PPM with his current degree of frailty and deconditioning.  We reviewed the risk, benefits and alternatives of transvenous VVI PPM implant which include but are not limited to incisional site pain, bleeding, infection, lead  dislodgment or perforation, damage to the lung/heart requiring emergency drain, arrhythmias and death.  I would not consider leadless PPM with his degree of frailty if we felt that pacing was of potential benefit.  Will check on him again in the morning and if he has only had intermittent paced beats we will plan to discontinue the TVP tomorrow to allow for rehab.  Plan for monitor at discharge if we do not need to proceed with inpatient PPM implant.  He has history of ESBL Klebsiella colonization and was hypothermic on admission without obvious source of infection.   For questions or updates, please contact [facility] Please consult [email or web address] for contact info under   Signed, Donnice DELENA Primus, MD  12/19/2024 10:20 AM     [1]  Allergies Allergen Reactions   Aspirin     GI Bleeding Other reaction(s): upset stomach   Bee Venom     Other reaction(s): Unknown   Tamsulosin     Other reaction(s): increased frequency   Nifedipine Other (See Comments)    Headaches  Other reaction(s): headaches   "

## 2024-12-19 NOTE — Progress Notes (Signed)
 PT Cancellation Note  Patient Details Name: Jared Deller, PhD MRN: [id number] DOB: 1929-03-24   Cancelled Treatment:    Reason Eval/Treat Not Completed: Patient at procedure or test/unavailable, MD at bedside performing thoracentesis upon our arrival. Will continue to follow and evaluate as appropriate.   [name], PT, DPT   Acute Rehabilitation Department Office [phone] Secure Chat Communication Preferred   [name] 12/19/2024, 4:20 PM

## 2024-12-19 NOTE — Progress Notes (Signed)
" ° °  NAME:  Jared Lantier, PhD, MRN:  [id number], DOB:  09-22-1929, LOS: 3 ADMISSION DATE:  12/16/2024, CONSULTATION DATE:  12/17/2024 REFERRING MD:  Aloha Daria Bitter, MD, CHIEF COMPLAINT:  Symptomatic Bradycardia   History of Present Illness:  This 89 year old gentleman is admitted to the hospitalist service.  He has a history of hypertension CVA atrial fibrillation, COPD, mild dementia, thrombocytopenia, BPH presents to the emergency room with progressive weakness confusion.  CT abdomen pelvis did not show anything acute.  Patient was having a difficult time with urinary obstruction.  He has hematuria.  Urology has seen him and exchanged his Foley and irrigating it.  Patient went into bradycardia with significant low heart rates.  Cardiology has seen him.  He is taken to Cath Lab for pacemaker placement.  ICU is asked to admit this patient after he pacemaker placement to monitor hemodynamics closely.  Pertinent  Medical History   Past Medical History:  Diagnosis Date   Acute, but ill-defined, cerebrovascular disease 04/07/2014   Adenomatous colon polyp    Allergy    Atrial fibrillation (HCC)    BPH (benign prostatic hyperplasia)    Cataract    removed both eyes    Community acquired pneumonia 03/05/2022   COPD (chronic obstructive pulmonary disease) (HCC)    Eczema    Erectile dysfunction    Gallstones    GERD (gastroesophageal reflux disease)    Heart murmur    HOH (hard of hearing)    HTN (hypertension)    Meningioma (HCC)    Right frontal   Meralgia paraesthetica    Palpitations    Urinary retention    Significant Hospital Events: Including procedures, antibiotic start and stop dates in addition to other pertinent events   12/17/2024 - Pacemaker placed.  Interim History / Subjective:  Ate some this am. Urine a little bloodier again, bladder scan neg.  Objective    Blood pressure (!) 96/45, pulse 60, temperature 98.6 F (37 C), temperature source Oral, resp. rate  20, height 5' 11 (1.803 m), weight 67.3 kg, SpO2 99%.    Vent Mode: BIPAP;PCV FiO2 (%):  [50 %] 50 % Set Rate:  [15 bmp] 15 bmp PEEP:  [8 cmH20] 8 cmH20   Intake/Output Summary (Last 24 hours) at 12/19/2024 9276 Last data filed at 12/19/2024 9355 Gross per 24 hour  Intake --  Output 1720 ml  Net -1720 ml   Filed Weights   12/17/24 2000 12/18/24 0500 12/19/24 0500  Weight: 67.9 kg 66.1 kg 67.3 kg   No change to exam: somnolent, hard of hearing Very weak Large simple appearing pleural effusions on US  Heart sounds regular, paced Moves to command Scattered bruising  Cr up a bit  Resolved problem list   Assessment and Plan  See discussion yesterday Think we are mostly dealing with FTT, poor PO and an aging heart Candidacy for PPM is still being evaluated No signs of infection Urine hopefully will clear up over time, not sure he will respond to lasix : defer to AHF eval EP to see to help reassure family Encourage protein intake Will check VBG, if showing signs of retention then there could be an argument to try pleural drainage; suspect effusions just related to poor PO but I guess cancer is a possibility Appreciate palliative input  Rolan Sharps MD PCCM "

## 2024-12-19 NOTE — Procedures (Signed)
 Thoracentesis  Procedure Note  Jared Kinzler, PhD  [id number]  1929/12/19  Date:12/19/2024  Time:4:20 PM   Provider Performing:Willis Kuipers C Claudene   Procedure: Thoracentesis with imaging guidance (67444)  Indication(s) Pleural Effusion  Consent Risks of the procedure as well as the alternatives and risks of each were explained to the patient and/or caregiver.  Consent for the procedure was obtained and is signed in the bedside chart  Anesthesia Topical only with 1% lidocaine     Time Out Verified patient identification, verified procedure, site/side was marked, verified correct patient position, special equipment/implants available, medications/allergies/relevant history reviewed, required imaging and test results available.   Sterile Technique Maximal sterile technique including full sterile barrier drape, hand hygiene, sterile gown, sterile gloves, mask, hair covering, sterile ultrasound probe cover (if used).  Procedure Description Ultrasound was used to identify appropriate pleural anatomy for placement and overlying skin marked.  Area of drainage cleaned and draped in sterile fashion. Lidocaine  was used to anesthetize the skin and subcutaneous tissue.  1000 cc's of straw appearing fluid was drained from the right pleural space. Catheter then removed and bandaid applied to site.   Complications/Tolerance None; patient tolerated the procedure well. Chest X-ray is ordered to confirm no post-procedural complication.   EBL Minimal   Specimen(s) Pleural fluid

## 2024-12-19 NOTE — Progress Notes (Signed)
 "  Palliative Medicine Inpatient Follow Up Note   HPI:   89 y.o. male  with past medical history significant of hypertension, CVA, GERD, A-fib, carotid artery disease, mild dementia, thrombocytopenia, COPD, BPH with urinary symptoms, ESBL colonization, status post left shoulder repair, and cholecystectomy admitted on 12/16/2024 with weakness and confusion.   Family reports he has had some intermittent slurred speech as well.  Also had at least 1 episode where he had left facial droop.  EMS was called due to persistent/worsening symptoms, and found patient to be bradycardic in the 20s.  Patient was then given 1 mg of atropine with improvement of heart rate to the 70s and transported to the ED for further evaluation.     Worth to note that patient also has a history of BPH, significant for suprapubic prostatectomy back in 2012, with regrowth of tissue.  Noted for consequent hematuria after attempting for Foley placement in the ED.   Patient has had 1 inpatient admission in the last 6 months.   This is hospital day 3.   PMT has been consulted to assist with goals of care conversation. Patient/Family face treatment option decisions, advanced directive decisions and anticipatory care needs.    Family face treatment option decision, advance directive decisions and anticipatory care needs.   Today's Discussion 12/19/2024  I have reviewed medical records including:  EPIC notes: Reviewed critical care MD note from 12/18/24 detailing plan of care for symptomatic bradycardia s/p TVP, traumatic foley insertion, possible sepsis. Reviewed rounding note from cardiologist 12/18/2024 detailing plan of care for bradycardia, will continue to trend HR, hopefully to improve without needing PPM, also with acute CHF with good response to diuretic. Reviewed Urologist progress notes from 12/18/2024 detailing plan of care for traumatic hematuria, hematuria cleared, to start patient on daily flomax. Reviewed to track  clinical course and prognostication.  Vital signs: From 12/19/24 08009: HR 72 BPM, BP 97/53 mmHG.  MAR: Reviewed PRN meds received over the last 24 hours. Melatonin 5mg  given on 12/19/24 0026.  Reviewed to assess needs for medication adjustment to optimize comfort.  Available advanced directives in ACP: None, requested from [facility].  Labs: Reviewed from 12/19/2024, leukocytosis at 19.7 , Hemoglobin sub optimal at 10.1 but stable. Reviewed to assist with prognostication.  Met with patient/family to discuss diagnosis prognosis, GOC, EOL wishes, disposition and options.   Patient is observed resting comfortably in bed. Not in any form of acute distress. No signs or gestures indicating pain or discomfort. Alert and oriented and able to make needs known. Noted for intermittent needing to perform oral suctioning due to oral airway secretions pooling. Vital signs on monitor appears reassuring. HR running on the low to mid 50s. O2 supplement currently at 4LPM/[place], and saturating well at 98-99%.  Patient affirms that he remembers me from yesterday.  Noted for patient doing brief breathing exercises using the incentive spirometry.   Met with patient's son, [name], and daughter [name], at bedside.  Reviewed the patient's current clinical course, including ongoing bradycardia status post temporary transvenous pacemaker and large pleural effusion.  Shared that cardiology and electrophysiology teams are actively involved.  Discussed potential benefits and risk of thoracentesis, as well as plan to trial CPAP conservatively to assess for symptomatic and respiratory improvement.  Emphasized importance of patient's participation in healthcare decision making to the extent possible.  Family verbalized understanding  Goals of care unchanged. Continue treating the treatable with current medical interventions in hopes for clinical improvement.  May or may  not need a PPM depending on if patient is sustaining HR within acceptable  range, ICU team will continue to track/monitor.   Created space and opportunity for patient to explore thoughts feelings and fears regarding current medical situation.  Patient and her family face treatment option decisions, advanced directive decisions and anticipatory care needs.   Questions and concerns addressed   Palliative Support Provided.   Objective Assessment: Vital Signs Vitals:   12/19/24 0730 12/19/24 0800  BP:  (!) 97/53  Pulse: (!) 51 70  Resp: 18 13  Temp:    SpO2: 100% 100%    Intake/Output Summary (Last 24 hours) at 12/19/2024 9167 Last data filed at 12/19/2024 0800 Gross per 24 hour  Intake 20 ml  Output 1555 ml  Net -1535 ml   Last Weight  Most recent update: 12/19/2024  5:45 AM    Weight  67.3 kg (148 lb 5.9 oz)             Gen: Chronically ill-appearing, not in distress HEENT: Mucous membranes are moist CV: Pulse rate 70 bpm PULM: Clear, but diminished bilaterally, O2 at 4LPM/[place] ABD: Soft and nontender EXT: No edema Neuro: Alert and oriented x 3  SUMMARY OF RECOMMENDATIONS    # Complex medical decision-making/goals of care CODE STATUS: Maintain DNR-prearrest interventions Goal of care is medical stabilization and recovery to the extent this is possible Per patient and family's desire, no chest compressions, but desires prearrest interventions such as intubation, and utilization of ACLS medications for trial period As appropriate. Delirium precautions Continue to provide psychosocial and emotional support to patient and family Palliative medicine team will continue to follow  # Symptom management Per ICU team Palliative medicine team is available to assist as needed  # Palliative Prophylaxis: Aspiration, bowel regimen, delirium protocol, frequent pain assessment, and turn reposition  # Psychosocial support Provided to patient, patient's significant other [facility]  # Discharge planning To be determined  # Treatment plan discussed  with: Patient, patient's significant other [name], nursing staff and medical team.   I personally spent a total of 35 minutes in the care of the patient today including preparing to see the patient, getting/reviewing separately obtained history, performing a medically appropriate exam/evaluation, counseling and educating, referring and communicating with other health care professionals, documenting clinical information in the EHR, independently interpreting results, communicating results, and coordinating care.  ______________________________________________________________________________________ Kathlyne Bolder NP-C [place] Palliative Medicine Team Team Cell Phone: [phone] Please utilize secure chat with additional questions, if there is no response within 30 minutes please call the above phone number  Palliative Medicine Team providers are available by phone from 7am to 7pm daily and can be reached through the team cell phone.  Should this patient require assistance outside of these hours, please call the patient's attending physician.     "

## 2024-12-20 DIAGNOSIS — E860 Dehydration: Secondary | ICD-10-CM | POA: Diagnosis not present

## 2024-12-20 DIAGNOSIS — R531 Weakness: Principal | ICD-10-CM

## 2024-12-20 DIAGNOSIS — R001 Bradycardia, unspecified: Secondary | ICD-10-CM | POA: Diagnosis not present

## 2024-12-20 DIAGNOSIS — R627 Adult failure to thrive: Secondary | ICD-10-CM

## 2024-12-20 DIAGNOSIS — I4821 Permanent atrial fibrillation: Secondary | ICD-10-CM

## 2024-12-20 DIAGNOSIS — T68XXXA Hypothermia, initial encounter: Secondary | ICD-10-CM | POA: Diagnosis not present

## 2024-12-20 DIAGNOSIS — Z7189 Other specified counseling: Secondary | ICD-10-CM | POA: Diagnosis not present

## 2024-12-20 DIAGNOSIS — Z515 Encounter for palliative care: Secondary | ICD-10-CM | POA: Diagnosis not present

## 2024-12-20 DIAGNOSIS — I495 Sick sinus syndrome: Secondary | ICD-10-CM | POA: Diagnosis not present

## 2024-12-20 DIAGNOSIS — R338 Other retention of urine: Secondary | ICD-10-CM | POA: Diagnosis not present

## 2024-12-20 LAB — RENAL FUNCTION PANEL
Albumin: 3.3 g/dL — ABNORMAL LOW (ref 3.5–5.0)
Anion gap: 7 (ref 5–15)
BUN: 46 mg/dL — ABNORMAL HIGH (ref 8–23)
CO2: 32 mmol/L (ref 22–32)
Calcium: 8.5 mg/dL — ABNORMAL LOW (ref 8.9–10.3)
Chloride: 101 mmol/L (ref 98–111)
Creatinine, Ser: 1.1 mg/dL (ref 0.61–1.24)
GFR, Estimated: >60 mL/min
Glucose, Bld: 76 mg/dL (ref 70–99)
Phosphorus: 3.1 mg/dL (ref 2.5–4.6)
Potassium: 4.3 mmol/L (ref 3.5–5.1)
Sodium: 139 mmol/L (ref 135–145)

## 2024-12-20 LAB — CBC
HCT: 30.8 % — ABNORMAL LOW (ref 39.0–52.0)
Hemoglobin: 9.9 g/dL — ABNORMAL LOW (ref 13.0–17.0)
MCH: 30.5 pg (ref 26.0–34.0)
MCHC: 32.1 g/dL (ref 30.0–36.0)
MCV: 94.8 fL (ref 80.0–100.0)
Platelets: 75 K/uL — ABNORMAL LOW (ref 150–400)
RBC: 3.25 MIL/uL — ABNORMAL LOW (ref 4.22–5.81)
RDW: 14.8 % (ref 11.5–15.5)
WBC: 17.6 K/uL — ABNORMAL HIGH (ref 4.0–10.5)
nRBC: 0.2 % (ref 0.0–0.2)

## 2024-12-20 LAB — MAGNESIUM: Magnesium: 2.3 mg/dL (ref 1.7–2.4)

## 2024-12-20 MED ORDER — ONDANSETRON HCL 4 MG/2ML IJ SOLN: 4.0000 mg | Freq: Four times a day (QID) | INTRAMUSCULAR | Status: DC | PRN

## 2024-12-20 MED ORDER — MORPHINE SULFATE (CONCENTRATE) 10 MG /0.5 ML PO SOLN: 5.0000 mg | ORAL | Status: DC | PRN

## 2024-12-20 MED ORDER — LORAZEPAM 2 MG/ML IJ SOLN: 1.0000 mg | INTRAMUSCULAR | Status: DC | PRN

## 2024-12-20 MED ORDER — ACETAMINOPHEN 650 MG RE SUPP: 650.0000 mg | Freq: Four times a day (QID) | RECTAL | Status: DC | PRN

## 2024-12-20 MED ORDER — TEMAZEPAM 7.5 MG PO CAPS
7.5000 mg | ORAL_CAPSULE | Freq: Once | ORAL | Status: AC
  Administered 2024-12-20: 7.5 mg via ORAL
  Filled 2024-12-20: qty 1

## 2024-12-20 MED ORDER — HALOPERIDOL LACTATE 2 MG/ML PO CONC: 2.0000 mg | Freq: Four times a day (QID) | ORAL | Status: DC | PRN

## 2024-12-20 MED ORDER — GLYCOPYRROLATE 0.2 MG/ML IJ SOLN: 0.2000 mg | INTRAMUSCULAR | Status: DC | PRN

## 2024-12-20 MED ORDER — ACETAMINOPHEN 325 MG PO TABS: 650.0000 mg | ORAL_TABLET | Freq: Four times a day (QID) | ORAL | Status: DC | PRN

## 2024-12-20 MED ORDER — HALOPERIDOL LACTATE 5 MG/ML IJ SOLN: 2.0000 mg | Freq: Four times a day (QID) | INTRAMUSCULAR | Status: DC | PRN

## 2024-12-20 MED ORDER — BISACODYL 10 MG RE SUPP: 10.0000 mg | Freq: Every day | RECTAL | Status: DC | PRN

## 2024-12-20 MED ORDER — GLYCOPYRROLATE 1 MG PO TABS: 1.0000 mg | ORAL_TABLET | ORAL | Status: DC | PRN

## 2024-12-20 MED ORDER — ACETAMINOPHEN 10 MG/ML IV SOLN
1000.0000 mg | Freq: Once | INTRAVENOUS | Status: AC
  Administered 2024-12-20: 1000 mg via INTRAVENOUS
  Filled 2024-12-20: qty 100

## 2024-12-20 MED ORDER — LORAZEPAM 2 MG/ML PO CONC: 1.0000 mg | ORAL | Status: DC | PRN

## 2024-12-20 MED ORDER — LORAZEPAM 1 MG PO TABS: 1.0000 mg | ORAL_TABLET | ORAL | Status: DC | PRN

## 2024-12-20 MED ORDER — HYDROMORPHONE BOLUS VIA INFUSION
0.2500 mg | INTRAVENOUS | Status: DC | PRN
  Administered 2024-12-20: 0.75 mg via INTRAVENOUS
  Administered 2024-12-20: 0.5 mg via INTRAVENOUS
  Administered 2024-12-20: 1 mg via INTRAVENOUS

## 2024-12-20 MED ORDER — DIPHENHYDRAMINE HCL 50 MG/ML IJ SOLN: 25.0000 mg | INTRAMUSCULAR | Status: DC | PRN

## 2024-12-20 MED ORDER — GLYCOPYRROLATE 0.2 MG/ML IJ SOLN
0.2000 mg | INTRAMUSCULAR | Status: DC | PRN
  Administered 2024-12-20: 0.2 mg via INTRAVENOUS
  Filled 2024-12-20: qty 1

## 2024-12-20 MED ORDER — ONDANSETRON 4 MG PO TBDP: 4.0000 mg | ORAL_TABLET | Freq: Four times a day (QID) | ORAL | Status: DC | PRN

## 2024-12-20 MED ORDER — POLYVINYL ALCOHOL 1.4 % OP SOLN: 1.0000 [drp] | Freq: Four times a day (QID) | OPHTHALMIC | Status: DC | PRN

## 2024-12-20 MED ORDER — BIOTENE DRY MOUTH MT LIQD: 15.0000 mL | Freq: Two times a day (BID) | OROMUCOSAL | Status: DC

## 2024-12-20 MED ORDER — TRAZODONE HCL 50 MG PO TABS
100.0000 mg | ORAL_TABLET | Freq: Once | ORAL | Status: AC
  Administered 2024-12-20: 100 mg via ORAL
  Filled 2024-12-20: qty 2

## 2024-12-20 MED ORDER — HYDROMORPHONE HCL-NACL 50-0.9 MG/50ML-% IV SOLN
0.5000 mg/h | INTRAVENOUS | Status: DC
  Administered 2024-12-20: 1 mg/h via INTRAVENOUS
  Filled 2024-12-20: qty 50

## 2024-12-20 MED ORDER — HALOPERIDOL 1 MG PO TABS: 2.0000 mg | ORAL_TABLET | Freq: Four times a day (QID) | ORAL | Status: DC | PRN

## 2024-12-21 LAB — LD, BODY FLUID (OTHER): LD, Body Fluid: 79 IU/L

## 2024-12-21 LAB — CULTURE, BLOOD (ROUTINE X 2)
Culture: NO GROWTH
Culture: NO GROWTH
Special Requests: ADEQUATE

## 2024-12-21 LAB — GLUCOSE, BODY FLUID OTHER: Glucose, Body Fluid Other: 118 mg/dL

## 2024-12-21 LAB — PROTEIN, BODY FLUID (OTHER): Total Protein, Body Fluid Other: 1.2 g/dL

## 2024-12-21 LAB — TRIGLYCERIDES, BODY FLUIDS: Triglycerides, Fluid: <9 mg/dL

## 2024-12-22 LAB — BODY FLUID CULTURE W GRAM STAIN
Culture: NO GROWTH
Gram Stain: NONE SEEN

## 2024-12-31 NOTE — Progress Notes (Signed)
" °   01/08/25 1945  Spiritual Encounters  Type of Visit Initial  Care provided to: Family  Conversation partners present during encounter Nurse  Reason for visit Patient death  OnCall Visit Yes   Paged to the room to support the family of a Jewish man who had passed. As with the faith, the family could not leave him so they were trying to make arrangements with a funeral home. I provided them with a list of funeral homes and a call card for when they made a choice. Offered any available support. "

## 2024-12-31 NOTE — Progress Notes (Signed)
" ° °  NAME:  Jared Larke, PhD, MRN:  [id number], DOB:  08-24-1929, LOS: 4 ADMISSION DATE:  12/16/2024, CONSULTATION DATE:  12/17/2024 REFERRING MD:  Aloha Daria Bitter, MD, CHIEF COMPLAINT:  Symptomatic Bradycardia   History of Present Illness:  This 89 year old gentleman is admitted to the hospitalist service.  He has a history of hypertension CVA atrial fibrillation, COPD, mild dementia, thrombocytopenia, BPH presents to the emergency room with progressive weakness confusion.  CT abdomen pelvis did not show anything acute.  Patient was having a difficult time with urinary obstruction.  He has hematuria.  Urology has seen him and exchanged his Foley and irrigating it.  Patient went into bradycardia with significant low heart rates.  Cardiology has seen him.  He is taken to Cath Lab for pacemaker placement.  ICU is asked to admit this patient after he pacemaker placement to monitor hemodynamics closely.  Pertinent  Medical History   Past Medical History:  Diagnosis Date   Acute, but ill-defined, cerebrovascular disease 04/07/2014   Adenomatous colon polyp    Allergy    Atrial fibrillation (HCC)    BPH (benign prostatic hyperplasia)    Cataract    removed both eyes    Community acquired pneumonia 03/05/2022   COPD (chronic obstructive pulmonary disease) (HCC)    Eczema    Erectile dysfunction    Gallstones    GERD (gastroesophageal reflux disease)    Heart murmur    HOH (hard of hearing)    HTN (hypertension)    Meningioma (HCC)    Right frontal   Meralgia paraesthetica    Palpitations    Urinary retention    Significant Hospital Events: Including procedures, antibiotic start and stop dates in addition to other pertinent events   12/17/2024 - Pacemaker placed. 12/19/24 thora  Interim History / Subjective:  Breathing improved last night after thora and was more lucid. Clearly told his family he was ready to die. Son having trouble but understands that it is the patient's  decision. Pacer turned down again, underlying 30s rhythm  Objective    Blood pressure (!) 144/74, pulse 61, temperature 97.9 F (36.6 C), temperature source Axillary, resp. rate (!) 24, height 5' 11 (1.803 m), weight 67.3 kg, SpO2 91%.        Intake/Output Summary (Last 24 hours) at January 12, 2025 1153 Last data filed at 01/12/25 0800 Gross per 24 hour  Intake 579.49 ml  Output 645 ml  Net -65.51 ml   Filed Weights   12/17/24 2000 12/18/24 0500 12/19/24 0500  Weight: 67.9 kg 66.1 kg 67.3 kg   Seems more clearheaded Occasional upper airway sounds but overall breathing pattern improved Ext warm +temporal wasting, bruising  CBC, BMP stable  Resolved problem list   Assessment and Plan  See previous discussions. Patient would like to pass peacefully, family at bedside to honor his wishes. PMT entering comfort orders, anticipate in-hospital passing. Condolences offered but glad that patient could make this decision on his own and die on his own terms with his family near.  Rolan Sharps MD PCCM "

## 2024-12-31 NOTE — Progress Notes (Addendum)
 "  Palliative Medicine Inpatient Follow Up Note   HPI: 89 y.o. male  with past medical history significant of hypertension, CVA, GERD, A-fib, carotid artery disease, mild dementia, thrombocytopenia, COPD, BPH with urinary symptoms, ESBL colonization, status post left shoulder repair, and cholecystectomy admitted on 12/16/2024 with weakness and confusion.   Family reports he has had some intermittent slurred speech as well.  Also had at least 1 episode where he had left facial droop.  EMS was called due to persistent/worsening symptoms, and found patient to be bradycardic in the 20s.  Patient was then given 1 mg of atropine with improvement of heart rate to the 70s and transported to the ED for further evaluation.     Worth to note that patient also has a history of BPH, significant for suprapubic prostatectomy back in 2012, with regrowth of tissue.  Noted for consequent hematuria after attempting for Foley placement in the ED.   Patient has had 1 inpatient admission in the last 6 months.   This is hospital day 4.   PMT has been consulted to assist with goals of care conversation. Patient/Family face treatment option decisions, advanced directive decisions and anticipatory care needs.    Family face treatment option decision, advance directive decisions and anticipatory care needs.  Today's Discussion 01/02/25  I have reviewed medical records including:  EPIC notes: Reviewed cardiologist progress note from 01-02-2025 detailing plan of care.  Noted that family is potentially interested in transitioning patient to comfort care today.  Patient is noted to be intermittently interactive but is extremely fatigued.  Noted that persistent leukocytosis and prior ESBL colonization may be contributing to his decline.  Noted for no indication for PPM with AF/VR 60s this morning.  Reviewed overnight critical care note from 01/02/2025 at 0 1:28 AM, patient requesting sleep medication to be on melatonin,  temazepam  x 1 dose given.  Reviewed critical care MD note from 12/19/2024, patient underwent right-sided thoracentesis, 1 L of pleural fluid removed.  Received notification/update from family yesterday that they are interested in moving forward to transition patient to comfort care and would like to discuss further.  Reviewed to track clinical course and prognostication.  Vital signs: Reviewed from 02-Jan-2025 at 0 8 AM: Temperature 97.9 F, blood pressure 144/74 mmHg, heart rate 60, respiration rate 24 breaths/min, saturating at 91% at 4 LPM per nasal cannula. MAR: Reviewed PRN meds received over the last 24 hours. Reviewed to assess needs for medication adjustment to optimize comfort.  Available advanced directives in ACP: None, pre-existing documents available, I requested family to provide us  with a copy. Labs: Reviewed labs from 01-02-2025: Hypoalbuminemia at 3.3, ongoing leukocytosis, WBC 17.6, anemia with hemoglobin low but stable at 9.9.Reviewed to assist with prescribing and prognostication  Met with patient/family to discuss diagnosis prognosis, GOC, EOL wishes, disposition and options.  Visited patient at bedside today with family members present, including significant other [name], son [name], daughter EIL, daughter-in-law, and his 2 grandchildren.  Patient was resting in bed in Semi-Fowler's position.  He did not appear to be in acute distress but appeared tired and fatigued.  Patient was sleeping, arousable to some extent, able to converse briefly and then would fall back asleep.  He mentioned that he has some pain and not really comfortable.  Goals of care were revisited.  Family shared that the patient has expressed a desire to be comfortable and stated that he is ready to go.  During today's visit, patient himself verbalized that he is tired,  not comfortable and ready to let life go.  ICU team was present at bedside during this discussion.  Family appeared sad, emotional, and overwhelmed  but expressed a clear wish to honor the patient's stated wishes.  Discussed that the goal of care is shifting from life-prolonging treatments to maximizing comfort and quality of life. Explained that this involves stopping interventions that may cause discomfort and focusing on relief of pain, breathlessness, anxiety, and other symptoms, as well as providing emotional and spiritual support.   We talked about transition to comfort measures in house and what that would entail inclusive of medications to control pain, dyspnea, agitation, nausea, itching, and hiccups.  We discussed stopping all uneccessary measures such as blood draws, needle sticks, and frequent vital signs. Utilized reflective listening throughout our time together.    I discussed and provided education today on end-of-life, it is expected that the person may become increasingly sleepy and less responsive, with changes in breathing patterns such as irregular shallow breaths and possible process.  The person relives intervention eating or drinking, and hands and feet may become cold or bluish circulation slows.  Confusion or difficulty recognizing that ones can occur, and skin may show changes in color mottling.  Throughout this process, our focus will be on keeping the patient comfortable and free from pain or distress.  This changes are natural part of the dying process. Patient/family verbalized understanding.  Provided education and counseling at length on the philosophy and benefits of hospice care. Discussed that it offers a holistic approach to care in the setting of end-stage illness, and is about supporting the patient where they are allowing nature to take it's course. Discussed the hospice team includes RNs, physicians, social workers, and chaplains.   Education offered on focusing on a comfort and dignity approach allowing for natural death versus interventions attempting to prolong life when we see that the body is failing to  thrive.  Education offered on the natural trajectory and expectations at end-of-life.  Created space and opportunity for patient to explore thoughts feelings and fears regarding current medical situation.  Patient and her family face treatment option decisions, advanced directive decisions and anticipatory care needs.   Questions and concerns addressed.  Palliative Support Provided.   Objective Assessment: Vital Signs Vitals:   12-Jan-2025 0700 Jan 12, 2025 0800  BP:  (!) 144/74  Pulse: (!) 54 61  Resp: 15 (!) 24  Temp:    SpO2: 95% 91%    Intake/Output Summary (Last 24 hours) at 12-Jan-2025 0820 Last data filed at 12-Jan-2025 0800 Gross per 24 hour  Intake 609.49 ml  Output 730 ml  Net -120.51 ml   Last Weight  Most recent update: 12/19/2024  5:45 AM    Weight  67.3 kg (148 lb 5.9 oz)             Gen: Chronically ill-appearing, fatigued-looking, not in any distress HEENT: Mucous membranes are moist CV: Heart rate 54 bpm PULM: Clear, diminished bilaterally ABD: Soft and nontender EXT: No edema Neuro: More alert today  SUMMARY OF RECOMMENDATIONS    # Complex medical decision-making/goals of care CODE STATUS: Transition to DNR comfort No further escalation in level of care Anticipate hospital death, but family open to transferring to inpatient hospice facility if needed or as appropriate. Extensive discussion with family today, they would want to honor patient's wish to let go of life. Patient verbalized he is ready to go. Continue to provide psychosocial and emotional support to family.  Offered to have chaplain be part of the care team, family declined at this time. Comfort cart for family Unrestricted visitations in the setting of end-of-life (per policy)  Symptom Management:  Hydromorphone  drip and as needed boluses or pain/air hunger/comfort.  Started at 1 mg/h, titrate as appropriate. Morphine  as needed for moderate pain/air hunger/comfort Robinul  PRN for  excessive secretions Ativan  PRN for agitation/anxiety Zofran  PRN for nausea Liquifilm tears PRN for dry eyes Haldol  PRN for agitation/anxiety May have comfort feeding Oxygen PRN  for comfort. No escalation.    # Psychosocial support Provided to patient, patient's family   # Discharge planning Family, for now wishes patient to stay in the hospital (anticipating hospital death) however open to transfer to an inpatient hospice facility if needed and as appropriate.  I started discussing with family outpatient hospice disposition, family appears overwhelmed at the moment and prefers to talk about outpatient hospice disposition at a later time.  # Treatment plan discussed with: Patient, patient's family, nursing staff, and medical team.  I personally spent a total of 65 minutes in the care of the patient today including preparing to see the patient, getting/reviewing separately obtained history, performing a medically appropriate exam/evaluation, counseling and educating, placing orders, referring and communicating with other health care professionals, documenting clinical information in the EHR, independently interpreting results, communicating results, and coordinating care.   ______________________________________________________________________________________ Kathlyne Bolder NP-C [facility] Palliative [facility] Team Team Cell Phone: [phone] Please utilize secure chat with additional questions, if there is no response within 30 minutes please call the above phone number  Palliative Medicine Team providers are available by phone from 7am to 7pm daily and can be reached through the team cell phone.  Should this patient require assistance outside of these hours, please call the patient's attending physician.     "

## 2024-12-31 NOTE — Progress Notes (Signed)
 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 EMS called d/t progressive weakness, change in mental status and bradycardia. Family reported he had not voided most of the day. Found have severe bradycardia to 20s, improved with atropine. Recurrent episodes after admission with asystole lasting up to 4 seconds s/p TVP.  Significant events:   12/18: Symptomatic bradycardia with asystolic episode up to 4 seconds. S/p TVP.  Subjective:    Remains very weak. Patient requesting comfort care. Family at bedisde   Objective:   Weight Range: 67.3 kg Body mass index is 20.69 kg/m.   Vital Signs:   Temp:  [97.9 F (36.6 C)] 97.9 F (36.6 C) 01/07/25 0400) Pulse Rate:  [43-72] 61 January 07, 2025 0800) Resp:  [12-27] 24 01-07-25 0800) BP: (84-144)/(47-74) 144/74 Jan 07, 2025 0800) SpO2:  [88 %-100 %] 91 % 01-07-2025 0800) Last BM Date : 2025/01/07  Weight change: Filed Weights   12/17/24 2000 12/18/24 0500 12/19/24 0500  Weight: 67.9 kg 66.1 kg 67.3 kg    Intake/Output:   Intake/Output Summary (Last 24 hours) at Jan 07, 2025 1222 Last data filed at January 07, 2025 0800 Gross per 24 hour  Intake 569.49 ml  Output 565 ml  Net 4.49 ml     Physical Exam   General:  Elderly. Thin male. Weak appearing.  HEENT: normal Neck: supple. no JVD.  RIJ TVP Cor: Irregular rate & rhythm. No rubs, gallops or murmurs. Lungs: clear Abdomen: soft, nontender, nondistended.Good bowel sounds. Extremities: no cyanosis, clubbing, rash, edema Neuro: alert & orientedx3, cranial nerves grossly intact. moves all 4 extremities w/o difficulty. Affect pleasant   Telemetry   Underlying AF 30-50s. Personally reviewed  Labs   CBC Recent Labs    12/19/24 0157 12/19/24 1051 12/19/24 1057 01-07-2025 0139   WBC 19.7*  --   --  17.6*  HGB 10.1*   < > 10.5* 9.9*  HCT 32.3*   < > 31.0* 30.8*  MCV 95.8  --   --  94.8  PLT 74*  73*  --   --  75*   < > = values in this interval not displayed.   Basic Metabolic Panel Recent Labs    87/79/74 0157 12/19/24 1051 12/19/24 1057 2025/01/07 0139  NA 140   < > 140 139  K 4.4   < > 3.9 4.3  CL 102  --   --  101  CO2 31  --   --  32  GLUCOSE 102*  --   --  76  BUN 43*  --   --  46*  CREATININE 1.32*  --   --  1.10  CALCIUM  8.4*  --   --  8.5*  MG 2.2  --   --  2.3  PHOS 4.7*  --   --  3.1   < > = values in this interval not displayed.   Liver Function Tests Recent Labs    12/18/24 0201 12/19/24 0157 01-07-25 0139  AST 18  --   --   ALT 23  --   --   ALKPHOS 70  --   --   BILITOT 1.2  --   --   PROT 4.8*  --   --   ALBUMIN 3.4* 3.4* 3.3*   No results for input(s): LIPASE, AMYLASE  in the last 72 hours.  Cardiac Enzymes No results for input(s): CKTOTAL, CKMB, CKMBINDEX, TROPONINI in the last 72 hours.  BNP: BNP (last 3 results) No results for input(s): BNP in the last 8760 hours.  ProBNP (last 3 results) Recent Labs    12/16/24 1320  PROBNP 2,863.0*     D-Dimer Recent Labs    12/19/24 0157  DDIMER <0.27   Hemoglobin A1C No results for input(s): HGBA1C in the last 72 hours. Fasting Lipid Panel No results for input(s): CHOL, HDL, LDLCALC, TRIG, CHOLHDL, LDLDIRECT in the last 72 hours. Medications:   Scheduled Medications:  Chlorhexidine  Gluconate Cloth  6 each Topical Daily   dorzolamide   1 drop Left Eye BID   latanoprost   1 drop Both Eyes QHS   mouth rinse  15 mL Mouth Rinse 4 times per day   pantoprazole   40 mg Oral Daily   polyethylene glycol  17 g Oral BID   [START ON 12/21/2024] thiamine  (VITAMIN B1) injection  100 mg Intravenous Q24H    Infusions:  sodium chloride  10 mL/hr at January 11, 2025 0800   thiamine  (VITAMIN B1) injection Stopped (12/19/24 2307)    PRN  Medications: acetaminophen  **OR** acetaminophen , melatonin, mouth rinse  Assessment/Plan   Bradycardia -Hx chronic bradycardia, rates typically 50s off AV nodal agents -I do not think that his bradycardia is the primary driver for failure to thrive picture or d/t combination of other medical issues -HR 20s when EMS arrived on 12/17, improved to 70s after atropine. Later had asystolic episodes up to 4 seconds in duration and underwent TVP 12/18. -Some concern that there may be vagally mediated component d/t urinary issues.  -Note hypothermia on presentation -Keep off donepezil  as it can exacerbate bradycardia  -He remains bradycardic but dobt this is main reason for his decompensation   Atrial fibrillation -Chronic -On Eliquis , currently on hold d/t hematuria - No change  Acute diastolic CHF Bilateral pleural effusions -In setting of severe bradycardia -Echo in 2021 w/ preserved EF  -Echo 12/18/24 EF 60-65% RV ok  -Diuresed well. Now off lasix   Pericardial effusion -Small to moderate on CT -Small one cho   BPH Urinary retention  Hematuria -Bladder irrigation per Urology  Hypothermia -Treated empirically with merrem  for possible sepsis with merrem , no clear source of infection. PCT negative. Unable to interpret UA d/t blood. -Hx ESBL Klebsiella pneumoniae colonization that is likely not actionable  Pleural effusions - CCM managing - s/p thora  Failure to thrive - multifactorial - Patient now requesting comfort care. I think this is very appropriate. Family at bedside  We d/w CCM and Palliative Care at bedside.   Length of Stay: 4  Toribio Fuel, MD  01-11-2025, 12:22 PM  Advanced Heart Failure Team Pager [phone] (M-F; 7a - 5p)   Please visit [email or web address]: For overnight coverage please call cardiology fellow first. If fellow not available call Shock/ECMO MD on call.  For ECMO / Mechanical Support (Impella, IABP, LVAD) issues call Shock / ECMO MD on call.     P "

## 2024-12-31 NOTE — Death Summary Note (Signed)
 " DEATH SUMMARY   Patient Details  Name: Jared Egerton, PhD MRN: [id number] DOB: Sep 30, 1929  Admission/Discharge Information   Admit Date:  2024/12/21  Date of Death:  2024-12-25  Time of Death:  16:00  Length of Stay: 4  Referring Physician: [name], MD   Reason(s) for Hospitalization  {Trauma:[id number]::***}  Diagnoses  Preliminary cause of death: Severe protein calorie malnutrition x 6 months Secondary Diagnoses (including complications and co-morbidities):  Principal Problem:   Hypothermia Active Problems:   HTN (hypertension)   GERD (gastroesophageal reflux disease)   COPD (chronic obstructive pulmonary disease) (HCC)   Paroxysmal A-fib (HCC)   Traumatic hematuria   History of stroke without residual deficits   Mild dementia (HCC)   Occlusion and stenosis of bilateral carotid arteries   Lower urinary tract symptoms due to benign prostatic hyperplasia   Thrombocytopenia   Prolonged QT interval   Elevated brain natriuretic peptide (BNP) level   Hypoperfusion of brain with transient focal neurological signs and symptoms   Symptomatic bradycardia   Status post placement of cardiac pacemaker   Bradycardia   Protein-calorie malnutrition, severe   Tachycardia-bradycardia syndrome (HCC)   Generalized weakness   FTT (failure to thrive) in adult   Brief Hospital Course (including significant findings, care, treatment, and services provided and events leading to death)  This 89 year old gentleman is admitted to the hospitalist service.  He has a history of hypertension CVA atrial fibrillation, COPD, mild dementia, thrombocytopenia, BPH presents to the emergency room with progressive weakness confusion.  CT abdomen pelvis did not show anything acute.  Patient was having a difficult time with urinary obstruction.  He has hematuria.  Urology has seen him and exchanged his Foley and irrigating it.   Patient went into bradycardia with significant low heart rates.  Cardiology  has seen him.  He is taken to Cath Lab for pacemaker placement.  ICU is asked to admit this patient after he pacemaker placement to monitor hemodynamics closely.   Hematuria eased up with eliquis  cessation He was given lasix  with some improvement in breathing A thoracentesis was performed 12/19/24 with some improvement in symptoms Pacemaker was turned down and he stayed in the 30s but still had pauses Even with pacemaker turned out, he still felt miserable After much discussion regarding quality of life,   Pertinent Labs and Studies  Significant Diagnostic Studies DG Chest Port 1 View Result Date: 12/19/2024 CLINICAL DATA:  Status post thoracentesis. EXAM: PORTABLE CHEST 1 VIEW COMPARISON:  Radiograph Dec 21, 2024, CT 12/17/2024 FINDINGS: The lung bases are not included in the field of view. No evidence of pneumothorax post thoracentesis. Decreasing hazy opacity in the right hemithorax. Persistent retrocardiac opacity and pleural effusion with air bronchograms. Stable heart size and mediastinal contours. Overlying artifact projecting over the right chest wall versus external pacing device. Improving pulmonary edema. IMPRESSION: 1. No evidence of pneumothorax post thoracentesis. 2. Decreasing hazy opacity in the right hemithorax. 3. Persistent retrocardiac opacity and pleural effusion. Electronically Signed   By: Andrea Gasman M.D.   On: 12/19/2024 16:44   ECHOCARDIOGRAM COMPLETE Result Date: 12/18/2024    ECHOCARDIOGRAM REPORT   Patient Name:   Jared Neal Date of Exam: 12/18/2024 Medical Rec #:  [id number]      Height:       71.0 in Accession #:    [id number]     Weight:       145.7 lb Date of Birth:  December 02, 1929      BSA:  1.843 m Patient Age:    89 years       BP:           114/68 mmHg Patient Gender: M              HR:           60 bpm. Exam Location:  Inpatient Procedure: 2D Echo, Cardiac Doppler, Color Doppler and Intracardiac            Opacification Agent (Both Spectral and Color  Flow Doppler were            utilized during procedure). Indications:    Elevated brain natriuretic peptide (BNP) levels; symptomatic                 bradycardia  History:        Patient has prior history of Echocardiogram examinations, most                 recent 08/18/2020. Pacemaker, COPD and Stroke, Arrythmias:Atrial                 Fibrillation; Risk Factors:Hypertension and Former Smoker.  Sonographer:    Merlynn Argyle Referring Phys: [id number] MARSA NOVAK MELVIN  Sonographer Comments: Technically difficult study due to poor echo windows and suboptimal apical window. IMPRESSIONS  1. Left ventricular ejection fraction, by estimation, is 60 to 65%. The left ventricle has normal function. The left ventricle has no regional wall motion abnormalities. There is mild left ventricular hypertrophy. Left ventricular diastolic parameters are indeterminate.  2. Right ventricular systolic function is normal. The right ventricular size is mildly enlarged. There is mildly elevated pulmonary artery systolic pressure. The estimated right ventricular systolic pressure is 44.4 mmHg.  3. Left atrial size was moderately dilated.  4. Right atrial size was moderately dilated.  5. A small pericardial effusion is present. Large pleural effusion in the left lateral region.  6. The mitral valve is normal in structure. Trivial mitral valve regurgitation. No evidence of mitral stenosis.  7. The aortic valve is tricuspid. Aortic valve regurgitation is not visualized. No aortic stenosis is present.  8. Aortic dilatation noted. There is mild dilatation of the ascending aorta, measuring 39 mm.  9. The inferior vena cava is dilated in size with <50% respiratory variability, suggesting right atrial pressure of 15 mmHg. FINDINGS  Left Ventricle: Left ventricular ejection fraction, by estimation, is 60 to 65%. The left ventricle has normal function. The left ventricle has no regional wall motion abnormalities. Definity  contrast agent was given IV to  delineate the left ventricular  endocardial borders. The left ventricular internal cavity size was normal in size. There is mild left ventricular hypertrophy. Left ventricular diastolic parameters are indeterminate. Right Ventricle: The right ventricular size is mildly enlarged. No increase in right ventricular wall thickness. Right ventricular systolic function is normal. There is mildly elevated pulmonary artery systolic pressure. The tricuspid regurgitant velocity is 2.71 m/s, and with an assumed right atrial pressure of 15 mmHg, the estimated right ventricular systolic pressure is 44.4 mmHg. Left Atrium: Left atrial size was moderately dilated. Right Atrium: Right atrial size was moderately dilated. Pericardium: A small pericardial effusion is present. Mitral Valve: The mitral valve is normal in structure. Trivial mitral valve regurgitation. No evidence of mitral valve stenosis. Tricuspid Valve: The tricuspid valve is normal in structure. Tricuspid valve regurgitation is trivial. Aortic Valve: The aortic valve is tricuspid. Aortic valve regurgitation is not visualized. No aortic stenosis is present. Pulmonic Valve: The pulmonic  valve was not well visualized. Pulmonic valve regurgitation is not visualized. Aorta: The aortic root is normal in size and structure and aortic dilatation noted. There is mild dilatation of the ascending aorta, measuring 39 mm. Venous: The inferior vena cava is dilated in size with less than 50% respiratory variability, suggesting right atrial pressure of 15 mmHg. IAS/Shunts: The interatrial septum was not well visualized. Additional Comments: There is a large pleural effusion in the left lateral region.  LEFT VENTRICLE PLAX 2D LVIDd:         5.10 cm   Diastology LVIDs:         3.30 cm   LV e' lateral:   10.50 cm/s LV PW:         1.30 cm   LV E/e' lateral: 8.5 LV IVS:        1.20 cm LVOT diam:     2.10 cm LV SV:         49 LV SV Index:   26 LVOT Area:     3.46 cm LV IVRT:       88 msec   RIGHT VENTRICLE          IVC RV Basal diam:  4.20 cm  IVC diam: 2.70 cm RV Mid diam:    3.00 cm TAPSE (M-mode): 1.3 cm LEFT ATRIUM             Index        RIGHT ATRIUM           Index LA diam:        5.50 cm 2.98 cm/m   RA Area:     25.90 cm LA Vol (A2C):   91.5 ml 49.64 ml/m  RA Volume:   80.70 ml  43.78 ml/m LA Vol (A4C):   83.1 ml 45.09 ml/m LA Biplane Vol: 89.2 ml 48.40 ml/m  AORTIC VALVE LVOT Vmax:   56.80 cm/s LVOT Vmean:  40.900 cm/s LVOT VTI:    0.141 m  AORTA Ao Root diam: 3.80 cm Ao Asc diam:  3.90 cm MITRAL VALVE               TRICUSPID VALVE MV Area (PHT): 3.68 cm    TR Peak grad:   29.4 mmHg MV Decel Time: 206 msec    TR Vmax:        271.00 cm/s MR Peak grad: 72.4 mmHg MR Mean grad: 48.0 mmHg    SHUNTS MR Vmax:      425.50 cm/s  Systemic VTI:  0.14 m MR Vmean:     322.0 cm/s   Systemic Diam: 2.10 cm MV E velocity: 89.50 cm/s MV A velocity: 38.10 cm/s MV E/A ratio:  2.35 Lonni Nanas MD Electronically signed by Lonni Nanas MD Signature Date/Time: 12/18/2024/9:45:58 AM    Final    CARDIAC CATHETERIZATION Result Date: 12/17/2024 Successful placement of a right internal jugular transvenous pacing catheter with capture threshold 0.6 mA.  Pacemaker set at 70 bpm, output 5 mA.  Patient catheter secured in place at 38 cm.   CT CHEST WO CONTRAST Result Date: 12/17/2024 CLINICAL DATA:  Shortness of breath (Ped 0-17y) Concern for aspiration pneumonia EXAM: CT CHEST WITHOUT CONTRAST TECHNIQUE: Multidetector CT imaging of the chest was performed following the standard protocol without IV contrast. RADIATION DOSE REDUCTION: This exam was performed according to the departmental dose-optimization program which includes automated exposure control, adjustment of the mA and/or kV according to patient size and/or use of iterative reconstruction technique. COMPARISON:  December 16, 2024 FINDINGS: Cardiovascular: Mild cardiomegaly. Small to moderate volume pericardial effusion. Scattered  multi-vessel coronary atherosclerosis. No aortic aneurysm. Scattered aortic atherosclerosis. Mediastinum/Nodes: No mediastinal mass.Mildly enlarged multistation mediastinal lymph nodes measuring up to 1.1 cm, likely reactive. No axillary or hilar lymphadenopathy. Lungs/Pleura: The midline trachea and bronchi are patent. Moderate volume bilateral pleural effusions, larger on the right than the left with compressive atelectasis in both upper and lower lobes. No pneumothorax. Minimal intralobular septal thickening noted predominantly in the upper lobes bilaterally. Musculoskeletal: No acute fracture or destructive bone lesion. Moderate joint space loss of both shoulders, likely degenerative. Loose bodies in the left shoulder joint. Multilevel degenerative disc disease of the spine. Upper Abdomen: No acute abnormality in the partially visualized upper abdomen. Similar appearing splenomegaly. IMPRESSION: 1. Cardiomegaly with minimal intralobular septal thickening noted in the lungs, possibly reflecting early interstitial edema. Moderate volume bilateral pleural effusions, larger on the right than the left, with compressive atelectasis in the lungs. 2. Mildly enlarged multistation mediastinal lymph nodes, measuring up to 1.1 cm, likely reactive. 3. Small to moderate volume pericardial effusion. Aortic Atherosclerosis (ICD10-I70.0). Electronically Signed   By: Rogelia Myers M.D.   On: 12/17/2024 10:56   MR BRAIN WO CONTRAST Addendum Date: 12/16/2024 ******** ADDENDUM #1 ******** ADDENDUM: 2.1 x 1.6 cm superficial cystic lesion in the left preauricular region. Correlate with direct inspection. ---------------------------------------------------- Electronically signed by: Dasie Hamburg MD 12/16/2024 05:24 PM EST RP Workstation: [id number]   Result Date: 12/16/2024 ******** ORIGINAL REPORT ******** EXAM: MRI BRAIN WITHOUT CONTRAST 12/13/2024 09:55:48 AM TECHNIQUE: Multiplanar multisequence MRI of the head/brain was  performed without the administration of intravenous contrast. COMPARISON: None available. CLINICAL HISTORY: Confusion, weakness and difficulty walking. FINDINGS: The examination is mildly motion degraded. BRAIN AND VENTRICLES: No acute infarct, midline shift, extra-axial fluid collection, or hydrocephalus is identified. A small focus of chronic hemorrhage is noted in the left temporo-occipital region along the medial margin of the atrium of the left lateral ventricle. Scattered small T2 hyperintensities in the cerebral white matter bilaterally are nonspecific but compatible with mild chronic small vessel ischemic disease. There is a small chronic right cerebellar infarct. There is mild cerebral atrophy. A FLAIR hyperintense extra-axial mass in the left middle cranial fossa anterior to the temporal lobe measures 1.7 x 1.2 cm. Similar smaller masses measure 5 mm along the anterior falx (series 319 image 24) and 9 mm (series 319 image 26) and 13 mm (series 319 image 30) over the right frontoparietal convexity. There is no significant associated mass effect or brain edema. Major intracranial vascular flow voids are preserved. ORBITS: Bilateral cataract extraction. SINUSES AND MASTOIDS: Minimal mucosal thickening in the paranasal sinuses. Trace bilateral mastoid fluid. BONES AND SOFT TISSUES: Normal marrow signal. No acute soft tissue abnormality. IMPRESSION: 1. No acute intracranial abnormality. 2. 4 extra-axial masses compatible with meningiomas, with the largest measuring 1.7 cm in the left middle cranial fossa. No significant mass effect or edema. 3. Mild chronic small vessel ischemic disease. Small chronic right cerebellar infarct. Electronically signed by: Dasie Hamburg MD 12/16/2024 05:14 PM EST RP Workstation: [id number]   MR BRAIN WO CONTRAST Result Date: 12/16/2024 EXAM: MRI BRAIN WITHOUT CONTRAST 12/16/2024 04:57:39 PM TECHNIQUE: Multiplanar multisequence MRI of the head/brain was performed without the  administration of intravenous contrast. COMPARISON: Head CT 12/16/2024 and MRI 12/13/2024. CLINICAL HISTORY: Neuro deficit, acute, stroke suspected; Transient ischemic attack (TIA). Worsening weakness and slowed speech. FINDINGS: LIMITATIONS: The examination is mildly motion degraded. BRAIN AND VENTRICLES: No acute infarct,  midline shift, extra-axial fluid collection, or hydrocephalus is identified. A small focus of chronic hemorrhage is again noted in the left temporo-occipital region along the medial margin of the atrium of the left lateral ventricle. Scattered small T2 hyperintensities in the cerebral white matter bilaterally are unchanged and nonspecific but compatible with mild chronic small vessel ischemic disease. There is a small chronic right cerebellar infarct. There is mild cerebral atrophy. A 1.7 cm extra-axial mass in the left middle cranial fossa and smaller extra-axial masses along the anterior falx and right frontoparietal convexity are all unchanged from the recent prior MRI and are without significant associated mass effect or brain edema. Major intracranial vascular flow voids are preserved. ORBITS: Bilateral cataract extraction. SINUSES AND MASTOIDS: Trace left mastoid fluid. Clear paranasal sinuses. BONES AND SOFT TISSUES: Normal marrow signal. Unchanged 2.2 x 1.1 cm superficial cystic focus in the left preauricular region. IMPRESSION: 1. No acute intracranial abnormality. 2. 4 unchanged extra-axial masses compatible with meningiomas. No significant mass effect or brain edema. 3. Mild chronic small vessel ischemic disease. 4. Small chronic right cerebellar infarct. Electronically signed by: Dasie Hamburg MD 12/16/2024 05:23 PM EST RP Workstation: [id number]   DG Chest Portable 1 View Result Date: 12/16/2024 CLINICAL DATA:  Pleural effusions versus infection. Weakness 4 days. EXAM: PORTABLE CHEST 1 VIEW COMPARISON:  10/25/2024 FINDINGS: Lungs are adequately inflated demonstrate hazy bilateral  perihilar and bibasilar opacification likely mild interstitial edema with possible small layering effusions. Infection is less likely. Cardiomediastinal silhouette and remainder of the exam is unchanged. IMPRESSION: Hazy bilateral perihilar and bibasilar opacification likely mild interstitial edema with possible small layering effusions. Infection is less likely. Electronically Signed   By: Toribio Agreste M.D.   On: 12/16/2024 13:57   CT ABDOMEN PELVIS W CONTRAST Result Date: 12/16/2024 CLINICAL DATA:  Abdominal pain and gross hematuria with abdominal distension. EXAM: CT ABDOMEN AND PELVIS WITH CONTRAST TECHNIQUE: Multidetector CT imaging of the abdomen and pelvis was performed using the standard protocol following bolus administration of intravenous contrast. RADIATION DOSE REDUCTION: This exam was performed according to the departmental dose-optimization program which includes automated exposure control, adjustment of the mA and/or kV according to patient size and/or use of iterative reconstruction technique. CONTRAST:  75mL OMNIPAQUE  IOHEXOL  350 MG/ML SOLN COMPARISON:  07/07/2020 FINDINGS: Lower chest: Borderline cardiomegaly. Moderate size hiatal hernia unchanged. Lung bases demonstrate bilateral moderate size pleural effusions with associated bibasilar airspace density which may be atelectasis versus infection. Hepatobiliary: Prior cholecystectomy. Mild diffuse low-attenuation of the liver compatible with a degree of steatosis or possible cirrhosis as there is minimal nodular contour to the liver. No focal liver mass. Biliary tree is unremarkable. Pancreas: 2.5 cm simple cystic structure over the head of the pancreas which is new. Somewhat ill-defined 1.3 cm low-attenuation over the more distal aspect of the pancreatic head unchanged. Fatty atrophy of the pancreas. Spleen: Mild stable splenomegaly. Adrenals/Urinary Tract: Adrenal glands are normal. Kidneys are normal in size. No evidence of nephrolithiasis.  Bilateral renal cysts unchanged. Likely parapelvic left renal cysts without significant change. Visualized ureters and bladder are unremarkable. Stomach/Bowel: Stomach is within normal. There is mild stranding of the fat anterior to the body of the stomach. Mild wall thickening of the first and second portion of the duodenal C sweep with adjacent free fluid. Findings may be due to duodenitis/peptic ulcer disease. Appendix not well visualized. Diverticulosis over the distal descending and sigmoid colon. No evidence of active inflammation. Vascular/Lymphatic: Calcified plaque over the abdominal aorta. Possible focal dissection  of the infrarenal abdominal aorta as the aorta is otherwise normal in caliber. No adenopathy. Reproductive: Stable enlargement of the prostate with impression upon the bladder base. Other: Mild-to-moderate free fluid over the pelvis which is new. Minimal free fluid over the pericolic gutters. Musculoskeletal: No focal abnormality. Degenerative changes of the spine and hips. IMPRESSION: 1. Mild wall thickening of the first and second portion of the duodenal C sweep with adjacent free fluid. Findings may be due to duodenitis/peptic ulcer disease. 2. New 2.5 cm simple cystic structure over the head of the pancreas. This may represent a pseudocyst or or cyst and less likely low-grade cystic neoplasm. Stable 1.3 cm low-attenuation over the more distal aspect of the pancreatic head. Recommend follow-up CT 2 years. 3. Mild diffuse low-attenuation of the liver compatible with a degree of steatosis or possible cirrhosis. Mild stable splenomegaly. Minimal ascites. 4. Colonic diverticulosis without active inflammation. 5. Table enlargement of the prostate with impression upon the bladder base. 6. Moderate size bilateral pleural effusions with associated bibasilar airspace density which may be atelectasis versus infection. 7. Aortic atherosclerosis. Possible focal dissection of the infrarenal abdominal  aorta as the aorta is otherwise normal in caliber. Aortic Atherosclerosis (ICD10-I70.0). Electronically Signed   By: Toribio Agreste M.D.   On: 12/16/2024 12:51   CT Head Wo Contrast Result Date: 12/16/2024 EXAM: CT HEAD WITHOUT CONTRAST 12/16/2024 10:40:49 AM TECHNIQUE: CT of the head was performed without the administration of intravenous contrast. Automated exposure control, iterative reconstruction, and/or weight based adjustment of the mA/kV was utilized to reduce the radiation dose to as low as reasonably achievable. COMPARISON: MRI of the head dated 12/13/2024. CLINICAL HISTORY: Mental status change, unknown cause. FINDINGS: BRAIN AND VENTRICLES: No acute hemorrhage. No evidence of acute infarct. No hydrocephalus. No extra-axial collection. No mass effect or midline shift. Moderate chronic microvascular ischemic change sequela. Calcified meningioma along right frontal convexity. ORBITS: Bilateral lens replacement. SINUSES: No acute abnormality. SOFT TISSUES AND SKULL: No acute soft tissue abnormality. No skull fracture. IMPRESSION: 1. No acute intracranial abnormality. 2. Moderate chronic microvascular ischemic change sequela. 3. Calcified meningioma along right frontal convexity. Electronically signed by: Evalene Coho MD 12/16/2024 10:47 AM EST RP Workstation: [id number]    Microbiology Recent Results (from the past 240 hours)  Culture, blood (Routine x 2)     Status: None (Preliminary result)   Collection Time: 12/16/24 12:06 PM   Specimen: BLOOD  Result Value Ref Range Status   Specimen Description BLOOD LEFT ANTECUBITAL  Final   Special Requests   Final    BOTTLES DRAWN AEROBIC AND ANAEROBIC Blood Culture adequate volume   Culture   Final    NO GROWTH 4 DAYS Performed at [facility], [address], [place], [place] [address]    Report Status PENDING  Incomplete  Culture, blood (Routine x 2)     Status: None (Preliminary result)   Collection Time: 12/16/24  1:20 PM    Specimen: BLOOD RIGHT HAND  Result Value Ref Range Status   Specimen Description BLOOD RIGHT HAND  Final   Special Requests   Final    AEROBIC BOTTLE ONLY Blood Culture results may not be optimal due to an inadequate volume of blood received in culture bottles   Culture   Final    NO GROWTH 4 DAYS Performed at [facility], [address] [place], [place] [address]    Report Status PENDING  Incomplete  Body fluid culture w Gram Stain  Status: None (Preliminary result)   Collection Time: 12/19/24  4:44 PM   Specimen: Pleural Fluid  Result Value Ref Range Status   Specimen Description PLEURAL  Final   Special Requests NONE  Final   Gram Stain NO WBC SEEN NO ORGANISMS SEEN   Final   Culture   Final    NO GROWTH < 24 HOURS Performed at [facility], [address] [place], [place] [address]    Report Status PENDING  Incomplete    Lab Basic Metabolic Panel: Recent Labs  Lab 12/17/24 0025 12/17/24 0903 12/18/24 0201 12/18/24 1639 12/19/24 0157 12/19/24 1051 12/19/24 1057 12/25/24 0139  NA 138  --  140 139 140 140 140 139  K 4.9  --  4.3 4.2 4.4 3.9 3.9 4.3  CL 105  --  104 101 102  --   --  101  CO2 25  --  28 30 31   --   --  32  GLUCOSE 115*  --  99 124* 102*  --   --  76  BUN 42*  --  41* 41* 43*  --   --  46*  CREATININE 1.13  --  1.11 1.29* 1.32*  --   --  1.10  CALCIUM  8.7*  --  8.8* 8.6* 8.4*  --   --  8.5*  MG 2.3  --   --   --  2.2  --   --  2.3  PHOS  --  3.7  --   --  4.7*  --   --  3.1   Liver Function Tests: Recent Labs  Lab 12/16/24 1020 12/17/24 0025 12/18/24 0201 12/19/24 0157 12/25/2024 0139  AST 26 25 18   --   --   ALT 28 29 23   --   --   ALKPHOS 73 78 70  --   --   BILITOT 0.9 1.1 1.2  --   --   PROT 5.2* 5.3* 4.8*  --   --   ALBUMIN 3.8 3.9 3.4* 3.4* 3.3*   Recent Labs  Lab 12/17/24 0025  LIPASE 26   Recent Labs  Lab 12/18/24 1022  AMMONIA 16   CBC: Recent Labs  Lab 12/16/24 1020 12/16/24 1029 12/17/24 0025  12/18/24 0201 12/19/24 0157 12/19/24 1051 12/19/24 1057 25-Dec-2024 0139  WBC 8.5  --  15.9* 20.0* 19.7*  --   --  17.6*  NEUTROABS 1.4*  --   --   --   --   --   --   --   HGB 10.4*   < > 10.7* 10.3* 10.1* 9.9* 10.5* 9.9*  HCT 33.3*   < > 33.8* 33.1* 32.3* 29.0* 31.0* 30.8*  MCV 96.5  --  96.0 95.7 95.8  --   --  94.8  PLT 59*  --  79* 64* 74*  73*  --   --  75*   < > = values in this interval not displayed.   Cardiac Enzymes: No results for input(s): CKTOTAL, CKMB, CKMBINDEX, TROPONINI in the last 168 hours. Sepsis Labs: Recent Labs  Lab 12/16/24 1007 12/16/24 1020 12/16/24 1242 12/17/24 0025 12/17/24 0903 12/17/24 1323 12/18/24 0201 12/19/24 0157 25-Dec-2024 0139  PROCALCITON  --   --   --   --  <0.10  --   --   --   --   WBC  --    < >  --  15.9*  --   --  20.0* 19.7* 17.6*  LATICACIDVEN 0.4*  --  0.3*  --   --  0.8  --   --   --    < > = values in this interval not displayed.    Procedures/Operations  ***   Toribio JAYSON Sharps December 31, 2024, 4:02 PM   "

## 2024-12-31 NOTE — Progress Notes (Signed)
 " Electrophysiology Progress Note:   Patient ID: Jared Culton, PhD MRN: [id number]; DOB: Jul 08, 1929  Admit date: 12/16/2024 Date of Consult: 01-03-25  Primary Care Provider: [name], MD [facility] Cardiologist: Maude Emmer, MD  [facility] Electrophysiologist:  None   Patient Profile:   Jared Mannan, PhD is a 89 y.o. male with a hx of permanent AF, SSS, prior CVA, HTN, COPD, mild dementia, BPH who is being evaluated for AF/SVR and functional decline.   Interval Events:   Turned down to VVI 30 yesterday morning. Was increased to VVI 65 overnight, unclear reason as he continued to have AF/SVR 30-50s but no indication that he was symptomatic although somewhat hard to assess with his baseline somnolence.  Had thoracentesis yesterday on his right side with 1 L straw-colored fluid removed.  This morning is slightly more alert than yesterday but has received several medications for sleep and his partner said that he has not slept much at all overnight.  Turned TVP back down to VVI 30 now and he is AF/VR 60s.    Past Medical History:  Diagnosis Date   Acute, but ill-defined, cerebrovascular disease 04/07/2014   Adenomatous colon polyp    Allergy    Atrial fibrillation (HCC)    BPH (benign prostatic hyperplasia)    Cataract    removed both eyes    Community acquired pneumonia 03/05/2022   COPD (chronic obstructive pulmonary disease) (HCC)    Eczema    Erectile dysfunction    Gallstones    GERD (gastroesophageal reflux disease)    Heart murmur    HOH (hard of hearing)    HTN (hypertension)    Meningioma (HCC)    Right frontal   Meralgia paraesthetica    Palpitations    Urinary retention     Past Surgical History:  Procedure Laterality Date   BLADDER DIVERTICULECTOMY  02/15/2003   thelbert 05/16/2011   CARDIAC CATHETERIZATION     CARDIOVERSION N/A 06/17/2020   Procedure: CARDIOVERSION;  Surgeon: Emmer [name], MD;  Location: [facility] ENDOSCOPY;  Service:  Cardiovascular;  Laterality: N/A;   CATARACT EXTRACTION Bilateral 2013   CHOLECYSTECTOMY N/A 11/19/2014   Procedure: LAPAROSCOPIC CHOLECYSTECTOMY WITH INTRAOPERATIVE CHOLANGIOGRAM;  Surgeon: Krystal Russell, MD;  Location: [facility] OR;  Service: General;  Laterality: N/A;   COLONOSCOPY     COLONOSCOPY W/ POLYPECTOMY     GUM SURGERY  1999   INGUINAL HERNIA REPAIR Left    INGUINAL HERNIA REPAIR Right 11/2009   LAPAROSCOPIC CHOLECYSTECTOMY  11/19/2014   w/IOC   MEATOTOMY  06/2003   Distal urethral meatotomy with YAG laser/notes 05/16/2011   POLYPECTOMY     SUPRAPUBIC PROSTATECTOMY  02/15/2003   thelbert 05/16/2011   TEMPORARY PACEMAKER N/A 12/17/2024   Procedure: TEMPORARY PACEMAKER;  Surgeon: Wonda Sharper, MD;  Location: [facility] INVASIVE CV LAB;  Service: Cardiovascular;  Laterality: N/A;   Inpatient Medications: Scheduled Meds:  Chlorhexidine  Gluconate Cloth  6 each Topical Daily   dorzolamide   1 drop Left Eye BID   latanoprost   1 drop Both Eyes QHS   mouth rinse  15 mL Mouth Rinse 4 times per day   pantoprazole   40 mg Oral Daily   polyethylene glycol  17 g Oral BID   [START ON 12/21/2024] thiamine  (VITAMIN B1) injection  100 mg Intravenous Q24H   Continuous Infusions:  sodium chloride  10 mL/hr at 01-03-2025 0000   thiamine  (VITAMIN B1) injection Stopped (12/19/24 2307)   PRN Meds: acetaminophen  **OR** acetaminophen , melatonin, mouth rinse  Allergies:  Allergies[1]  Social History:   Social History   Socioeconomic History   Marital status: Divorced    Spouse name: Not on file   Number of children: 2   Years of education: Ph. D   Highest education level: Not on file  Occupational History   Occupation: Retired-psychologist  Tobacco Use   Smoking status: Former    Current packs/day: 0.00    Average packs/day: 0.2 packs/day for 40.0 years (8.0 ttl pk-yrs)    Types: Cigarettes    Start date: 11/12/1959    Quit date: 11/12/1999    Years since quitting: 25.1   Smokeless tobacco:  Never  Vaping Use   Vaping status: Never Used  Substance and Sexual Activity   Alcohol  use: Not Currently    Comment: occ   Drug use: No   Sexual activity: Not Currently  Other Topics Concern   Not on file  Social History Narrative   Not on file   Social Drivers of Health   Tobacco Use: Medium Risk (12/16/2024)   Patient History    Smoking Tobacco Use: Former    Smokeless Tobacco Use: Never    Passive Exposure: Not on Actuary Strain: Not on file  Food Insecurity: Not on file  Transportation Needs: Not on file  Physical Activity: Not on file  Stress: Not on file  Social Connections: Not on file  Intimate Partner Violence: Not on file  Depression (PHQ2-9): Not on file  Alcohol  Screen: Not on file  Housing: Not on file  Utilities: Not on file  Health Literacy: Not on file    Family History:    Family History  Problem Relation Age of Onset   Alzheimer's disease Mother    Heart attack Father    Cancer - Lung Sister    Colon cancer Neg Hx    Colon polyps Neg Hx    Esophageal cancer Neg Hx    Rectal cancer Neg Hx    Stomach cancer Neg Hx     ROS:  Review of Systems: [y] = yes, [ ]  = no      General: Weight gain [ ] ; Weight loss [ ] ; Anorexia [ ] ; Fatigue [ ] ; Fever [ ] ; Chills [ ] ; Weakness [ ]    Cardiac: Chest pain/pressure [ ] ; Resting SOB [ ] ; Exertional SOB [ ] ; Orthopnea [ ] ; Pedal Edema [ ] ; Palpitations [ ] ; Syncope [ ] ; Presyncope [ ] ; Paroxysmal nocturnal dyspnea [ ]    Pulmonary: Cough [ ] ; Wheezing [ ] ; Hemoptysis [ ] ; Sputum [ ] ; Snoring [ ]    GI: Vomiting [ ] ; Dysphagia [ ] ; Melena [ ] ; Hematochezia [ ] ; Heartburn [ ] ; Abdominal pain [ ] ; Constipation [ ] ; Diarrhea [ ] ; BRBPR [ ]    GU: Hematuria [ ] ; Dysuria [ ] ; Nocturia [ ]  Vascular: Pain in legs with walking [ ] ; Pain in feet with lying flat [ ] ; Non-healing sores [ ] ; Stroke [ ] ; TIA [ ] ; Slurred speech [ ] ;   Neuro: Headaches [ ] ; Vertigo [ ] ; Seizures [ ] ; Paresthesias [ ] ;Blurred  vision [ ] ; Diplopia [ ] ; Vision changes [ ]    Ortho/Skin: Arthritis [ ] ; Joint pain [ ] ; Muscle pain [ ] ; Joint swelling [ ] ; Back Pain [ ] ; Rash [ ]    Psych: Depression [ ] ; Anxiety [ ]    Heme: Bleeding problems [ ] ; Clotting disorders [ ] ; Anemia [ ]    Endocrine: Diabetes [ ] ; Thyroid  dysfunction [ ]   Physical Exam/Data:    Intake/Output Summary (Last 24 hours) at 12-22-24 0627 Last data filed at 12/22/2024 0500 Gross per 24 hour  Intake 537.03 ml  Output 940 ml  Net -402.97 ml      12/19/2024    5:00 AM 12/18/2024    5:00 AM 12/17/2024    8:00 PM  Last 3 Weights  Weight (lbs) 148 lb 5.9 oz 145 lb 11.6 oz 149 lb 11.1 oz  Weight (kg) 67.3 kg 66.1 kg 67.9 kg     Body mass index is 20.69 kg/m.  General: frail, intermittently conversant, falls asleep mid conversation  HEENT: normal Neck: no JVD Vascular: No carotid bruits; FA pulses 2+ bilaterally without bruits  Cardiac: slow rate, regular rhythm, no M/G/R Lungs: clear to auscultation bilaterally, no wheezing, rhonchi or rales  Abd: soft, nontender, no hepatomegaly  Ext: no edema Musculoskeletal:  No deformities, BUE and BLE strength normal and equal Skin: warm and dry  Neuro:  CNs 2-12 intact, no focal abnormalities noted Psych:  Normal affect   EKG: None today  Telemetry: VP 60s overnight, turned back to VVI 30 this morning, threshold on TVP 0.6 mA  EKG review: 12/17/24: AF/SVR 59, QRS 90, QT/c 475/471 12/16/24: (09:55:53) AF/SVR 59, QRS 101, QT/c 529/525 12/16/24: (09:41:45) AF/VR 64, QRS 114, QT/c 504/521 12/10/24: AF/SVR 56, QRS 88, QT/c 270/260  03/06/22: AF/VR 83, QRS 86, QT/c 398/467, isolated PVC 03/05/22: AF/VR 97, QRS 86, QT/c 354/450, ventricular trigeminy-bigeminy (PVC with LBBB V1 and V3 transition, inferior axis, possibly LCC origin)   Relevant CV Studies: TTE Result date: 12/18/24  1. Left ventricular ejection fraction, by estimation, is 60 to 65%. The  left ventricle has normal function. The  left ventricle has no regional  wall motion abnormalities. There is mild left ventricular hypertrophy.  Left ventricular diastolic parameters  are indeterminate.   2. Right ventricular systolic function is normal. The right ventricular  size is mildly enlarged. There is mildly elevated pulmonary artery  systolic pressure. The estimated right ventricular systolic pressure is  44.4 mmHg.   3. Left atrial size was moderately dilated.   4. Right atrial size was moderately dilated.   5. A small pericardial effusion is present. Large pleural effusion in the  left lateral region.   6. The mitral valve is normal in structure. Trivial mitral valve  regurgitation. No evidence of mitral stenosis.   7. The aortic valve is tricuspid. Aortic valve regurgitation is not  visualized. No aortic stenosis is present.   8. Aortic dilatation noted. There is mild dilatation of the ascending  aorta, measuring 39 mm.   9. The inferior vena cava is dilated in size with <50% respiratory  variability, suggesting right atrial pressure of 15 mmHg.   Laboratory Data:  Chemistry Recent Labs  Lab 12/18/24 1639 12/19/24 0157 12/19/24 1051 12/19/24 1057 12-22-2024 0139  NA 139 140 140 140 139  K 4.2 4.4 3.9 3.9 4.3  CL 101 102  --   --  101  CO2 30 31  --   --  32  GLUCOSE 124* 102*  --   --  76  BUN 41* 43*  --   --  46*  CREATININE 1.29* 1.32*  --   --  1.10  CALCIUM  8.6* 8.4*  --   --  8.5*  GFRNONAA 52* 50*  --   --  >60  ANIONGAP 8 7  --   --  7    Recent Labs  Lab 12/16/24  1020 12/17/24 0025 12/18/24 0201 12/19/24 0157 19-Jan-2025 0139  PROT 5.2* 5.3* 4.8*  --   --   ALBUMIN 3.8 3.9 3.4* 3.4* 3.3*  AST 26 25 18   --   --   ALT 28 29 23   --   --   ALKPHOS 73 78 70  --   --   BILITOT 0.9 1.1 1.2  --   --    Hematology Recent Labs  Lab 12/18/24 0201 12/19/24 0157 12/19/24 1051 12/19/24 1057 01/19/25 0139  WBC 20.0* 19.7*  --   --  17.6*  RBC 3.46* 3.37*  --   --  3.25*  HGB 10.3* 10.1*  9.9* 10.5* 9.9*  HCT 33.1* 32.3* 29.0* 31.0* 30.8*  MCV 95.7 95.8  --   --  94.8  MCH 29.8 30.0  --   --  30.5  MCHC 31.1 31.3  --   --  32.1  RDW 14.9 14.9  --   --  14.8  PLT 64* 74*  73*  --   --  75*   BNP Recent Labs  Lab 12/16/24 1320  PROBNP 2,863.0*    DDimer  Recent Labs  Lab 12/19/24 0157  DDIMER <0.27   Radiology/Studies:  BOEING Chest Port 1 View Result Date: 12/19/2024 CLINICAL DATA:  Status post thoracentesis. EXAM: PORTABLE CHEST 1 VIEW COMPARISON:  Radiograph 12/16/2024, CT 12/17/2024 FINDINGS: The lung bases are not included in the field of view. No evidence of pneumothorax post thoracentesis. Decreasing hazy opacity in the right hemithorax. Persistent retrocardiac opacity and pleural effusion with air bronchograms. Stable heart size and mediastinal contours. Overlying artifact projecting over the right chest wall versus external pacing device. Improving pulmonary edema. IMPRESSION: 1. No evidence of pneumothorax post thoracentesis. 2. Decreasing hazy opacity in the right hemithorax. 3. Persistent retrocardiac opacity and pleural effusion. Electronically Signed   By: Andrea Gasman M.D.   On: 12/19/2024 16:44   ECHOCARDIOGRAM COMPLETE Result Date: 12/18/2024    ECHOCARDIOGRAM REPORT   Patient Name:   KHIREE BUKHARI Date of Exam: 12/18/2024 Medical Rec #:  [id number]      Height:       71.0 in Accession #:    [id number]     Weight:       145.7 lb Date of Birth:  04-22-1929      BSA:          1.843 m Patient Age:    89 years       BP:           114/68 mmHg Patient Gender: M              HR:           60 bpm. Exam Location:  Inpatient Procedure: 2D Echo, Cardiac Doppler, Color Doppler and Intracardiac            Opacification Agent (Both Spectral and Color Flow Doppler were            utilized during procedure). Indications:    Elevated brain natriuretic peptide (BNP) levels; symptomatic                 bradycardia  History:        Patient has prior history of Echocardiogram  examinations, most                 recent 08/18/2020. Pacemaker, COPD and Stroke, Arrythmias:Atrial  Fibrillation; Risk Factors:Hypertension and Former Smoker.  Sonographer:    Merlynn Argyle Referring Phys: [id number] MARSA NOVAK MELVIN  Sonographer Comments: Technically difficult study due to poor echo windows and suboptimal apical window. IMPRESSIONS  1. Left ventricular ejection fraction, by estimation, is 60 to 65%. The left ventricle has normal function. The left ventricle has no regional wall motion abnormalities. There is mild left ventricular hypertrophy. Left ventricular diastolic parameters are indeterminate.  2. Right ventricular systolic function is normal. The right ventricular size is mildly enlarged. There is mildly elevated pulmonary artery systolic pressure. The estimated right ventricular systolic pressure is 44.4 mmHg.  3. Left atrial size was moderately dilated.  4. Right atrial size was moderately dilated.  5. A small pericardial effusion is present. Large pleural effusion in the left lateral region.  6. The mitral valve is normal in structure. Trivial mitral valve regurgitation. No evidence of mitral stenosis.  7. The aortic valve is tricuspid. Aortic valve regurgitation is not visualized. No aortic stenosis is present.  8. Aortic dilatation noted. There is mild dilatation of the ascending aorta, measuring 39 mm.  9. The inferior vena cava is dilated in size with <50% respiratory variability, suggesting right atrial pressure of 15 mmHg. FINDINGS  Left Ventricle: Left ventricular ejection fraction, by estimation, is 60 to 65%. The left ventricle has normal function. The left ventricle has no regional wall motion abnormalities. Definity  contrast agent was given IV to delineate the left ventricular  endocardial borders. The left ventricular internal cavity size was normal in size. There is mild left ventricular hypertrophy. Left ventricular diastolic parameters are indeterminate. Right  Ventricle: The right ventricular size is mildly enlarged. No increase in right ventricular wall thickness. Right ventricular systolic function is normal. There is mildly elevated pulmonary artery systolic pressure. The tricuspid regurgitant velocity is 2.71 m/s, and with an assumed right atrial pressure of 15 mmHg, the estimated right ventricular systolic pressure is 44.4 mmHg. Left Atrium: Left atrial size was moderately dilated. Right Atrium: Right atrial size was moderately dilated. Pericardium: A small pericardial effusion is present. Mitral Valve: The mitral valve is normal in structure. Trivial mitral valve regurgitation. No evidence of mitral valve stenosis. Tricuspid Valve: The tricuspid valve is normal in structure. Tricuspid valve regurgitation is trivial. Aortic Valve: The aortic valve is tricuspid. Aortic valve regurgitation is not visualized. No aortic stenosis is present. Pulmonic Valve: The pulmonic valve was not well visualized. Pulmonic valve regurgitation is not visualized. Aorta: The aortic root is normal in size and structure and aortic dilatation noted. There is mild dilatation of the ascending aorta, measuring 39 mm. Venous: The inferior vena cava is dilated in size with less than 50% respiratory variability, suggesting right atrial pressure of 15 mmHg. IAS/Shunts: The interatrial septum was not well visualized. Additional Comments: There is a large pleural effusion in the left lateral region.  LEFT VENTRICLE PLAX 2D LVIDd:         5.10 cm   Diastology LVIDs:         3.30 cm   LV e' lateral:   10.50 cm/s LV PW:         1.30 cm   LV E/e' lateral: 8.5 LV IVS:        1.20 cm LVOT diam:     2.10 cm LV SV:         49 LV SV Index:   26 LVOT Area:     3.46 cm LV IVRT:       88  msec  RIGHT VENTRICLE          IVC RV Basal diam:  4.20 cm  IVC diam: 2.70 cm RV Mid diam:    3.00 cm TAPSE (M-mode): 1.3 cm LEFT ATRIUM             Index        RIGHT ATRIUM           Index LA diam:        5.50 cm 2.98 cm/m    RA Area:     25.90 cm LA Vol (A2C):   91.5 ml 49.64 ml/m  RA Volume:   80.70 ml  43.78 ml/m LA Vol (A4C):   83.1 ml 45.09 ml/m LA Biplane Vol: 89.2 ml 48.40 ml/m  AORTIC VALVE LVOT Vmax:   56.80 cm/s LVOT Vmean:  40.900 cm/s LVOT VTI:    0.141 m  AORTA Ao Root diam: 3.80 cm Ao Asc diam:  3.90 cm MITRAL VALVE               TRICUSPID VALVE MV Area (PHT): 3.68 cm    TR Peak grad:   29.4 mmHg MV Decel Time: 206 msec    TR Vmax:        271.00 cm/s MR Peak grad: 72.4 mmHg MR Mean grad: 48.0 mmHg    SHUNTS MR Vmax:      425.50 cm/s  Systemic VTI:  0.14 m MR Vmean:     322.0 cm/s   Systemic Diam: 2.10 cm MV E velocity: 89.50 cm/s MV A velocity: 38.10 cm/s MV E/A ratio:  2.35 Lonni Nanas MD Electronically signed by Lonni Nanas MD Signature Date/Time: 12/18/2024/9:45:58 AM    Final    CARDIAC CATHETERIZATION Result Date: 12/17/2024 Successful placement of a right internal jugular transvenous pacing catheter with capture threshold 0.6 mA.  Pacemaker set at 70 bpm, output 5 mA.  Patient catheter secured in place at 38 cm.   CT CHEST WO CONTRAST Result Date: 12/17/2024 CLINICAL DATA:  Shortness of breath (Ped 0-17y) Concern for aspiration pneumonia EXAM: CT CHEST WITHOUT CONTRAST TECHNIQUE: Multidetector CT imaging of the chest was performed following the standard protocol without IV contrast. RADIATION DOSE REDUCTION: This exam was performed according to the departmental dose-optimization program which includes automated exposure control, adjustment of the mA and/or kV according to patient size and/or use of iterative reconstruction technique. COMPARISON:  December 16, 2024 FINDINGS: Cardiovascular: Mild cardiomegaly. Small to moderate volume pericardial effusion. Scattered multi-vessel coronary atherosclerosis. No aortic aneurysm. Scattered aortic atherosclerosis. Mediastinum/Nodes: No mediastinal mass.Mildly enlarged multistation mediastinal lymph nodes measuring up to 1.1 cm, likely reactive.  No axillary or hilar lymphadenopathy. Lungs/Pleura: The midline trachea and bronchi are patent. Moderate volume bilateral pleural effusions, larger on the right than the left with compressive atelectasis in both upper and lower lobes. No pneumothorax. Minimal intralobular septal thickening noted predominantly in the upper lobes bilaterally. Musculoskeletal: No acute fracture or destructive bone lesion. Moderate joint space loss of both shoulders, likely degenerative. Loose bodies in the left shoulder joint. Multilevel degenerative disc disease of the spine. Upper Abdomen: No acute abnormality in the partially visualized upper abdomen. Similar appearing splenomegaly. IMPRESSION: 1. Cardiomegaly with minimal intralobular septal thickening noted in the lungs, possibly reflecting early interstitial edema. Moderate volume bilateral pleural effusions, larger on the right than the left, with compressive atelectasis in the lungs. 2. Mildly enlarged multistation mediastinal lymph nodes, measuring up to 1.1 cm, likely reactive. 3. Small to moderate volume pericardial effusion. Aortic  Atherosclerosis (ICD10-I70.0). Electronically Signed   By: Rogelia Myers M.D.   On: 12/17/2024 10:56   MR BRAIN WO CONTRAST Result Date: 12/16/2024 EXAM: MRI BRAIN WITHOUT CONTRAST 12/16/2024 04:57:39 PM TECHNIQUE: Multiplanar multisequence MRI of the head/brain was performed without the administration of intravenous contrast. COMPARISON: Head CT 12/16/2024 and MRI 12/13/2024. CLINICAL HISTORY: Neuro deficit, acute, stroke suspected; Transient ischemic attack (TIA). Worsening weakness and slowed speech. FINDINGS: LIMITATIONS: The examination is mildly motion degraded. BRAIN AND VENTRICLES: No acute infarct, midline shift, extra-axial fluid collection, or hydrocephalus is identified. A small focus of chronic hemorrhage is again noted in the left temporo-occipital region along the medial margin of the atrium of the left lateral ventricle.  Scattered small T2 hyperintensities in the cerebral white matter bilaterally are unchanged and nonspecific but compatible with mild chronic small vessel ischemic disease. There is a small chronic right cerebellar infarct. There is mild cerebral atrophy. A 1.7 cm extra-axial mass in the left middle cranial fossa and smaller extra-axial masses along the anterior falx and right frontoparietal convexity are all unchanged from the recent prior MRI and are without significant associated mass effect or brain edema. Major intracranial vascular flow voids are preserved. ORBITS: Bilateral cataract extraction. SINUSES AND MASTOIDS: Trace left mastoid fluid. Clear paranasal sinuses. BONES AND SOFT TISSUES: Normal marrow signal. Unchanged 2.2 x 1.1 cm superficial cystic focus in the left preauricular region. IMPRESSION: 1. No acute intracranial abnormality. 2. 4 unchanged extra-axial masses compatible with meningiomas. No significant mass effect or brain edema. 3. Mild chronic small vessel ischemic disease. 4. Small chronic right cerebellar infarct. Electronically signed by: Dasie Hamburg MD 12/16/2024 05:23 PM EST RP Workstation: [id number]   DG Chest Portable 1 View Result Date: 12/16/2024 CLINICAL DATA:  Pleural effusions versus infection. Weakness 4 days. EXAM: PORTABLE CHEST 1 VIEW COMPARISON:  10/25/2024 FINDINGS: Lungs are adequately inflated demonstrate hazy bilateral perihilar and bibasilar opacification likely mild interstitial edema with possible small layering effusions. Infection is less likely. Cardiomediastinal silhouette and remainder of the exam is unchanged. IMPRESSION: Hazy bilateral perihilar and bibasilar opacification likely mild interstitial edema with possible small layering effusions. Infection is less likely. Electronically Signed   By: Toribio Agreste M.D.   On: 12/16/2024 13:57   CT ABDOMEN PELVIS W CONTRAST Result Date: 12/16/2024 CLINICAL DATA:  Abdominal pain and gross hematuria with abdominal  distension. EXAM: CT ABDOMEN AND PELVIS WITH CONTRAST TECHNIQUE: Multidetector CT imaging of the abdomen and pelvis was performed using the standard protocol following bolus administration of intravenous contrast. RADIATION DOSE REDUCTION: This exam was performed according to the departmental dose-optimization program which includes automated exposure control, adjustment of the mA and/or kV according to patient size and/or use of iterative reconstruction technique. CONTRAST:  75mL OMNIPAQUE  IOHEXOL  350 MG/ML SOLN COMPARISON:  07/07/2020 FINDINGS: Lower chest: Borderline cardiomegaly. Moderate size hiatal hernia unchanged. Lung bases demonstrate bilateral moderate size pleural effusions with associated bibasilar airspace density which may be atelectasis versus infection. Hepatobiliary: Prior cholecystectomy. Mild diffuse low-attenuation of the liver compatible with a degree of steatosis or possible cirrhosis as there is minimal nodular contour to the liver. No focal liver mass. Biliary tree is unremarkable. Pancreas: 2.5 cm simple cystic structure over the head of the pancreas which is new. Somewhat ill-defined 1.3 cm low-attenuation over the more distal aspect of the pancreatic head unchanged. Fatty atrophy of the pancreas. Spleen: Mild stable splenomegaly. Adrenals/Urinary Tract: Adrenal glands are normal. Kidneys are normal in size. No evidence of nephrolithiasis. Bilateral renal cysts unchanged. Likely  parapelvic left renal cysts without significant change. Visualized ureters and bladder are unremarkable. Stomach/Bowel: Stomach is within normal. There is mild stranding of the fat anterior to the body of the stomach. Mild wall thickening of the first and second portion of the duodenal C sweep with adjacent free fluid. Findings may be due to duodenitis/peptic ulcer disease. Appendix not well visualized. Diverticulosis over the distal descending and sigmoid colon. No evidence of active inflammation.  Vascular/Lymphatic: Calcified plaque over the abdominal aorta. Possible focal dissection of the infrarenal abdominal aorta as the aorta is otherwise normal in caliber. No adenopathy. Reproductive: Stable enlargement of the prostate with impression upon the bladder base. Other: Mild-to-moderate free fluid over the pelvis which is new. Minimal free fluid over the pericolic gutters. Musculoskeletal: No focal abnormality. Degenerative changes of the spine and hips. IMPRESSION: 1. Mild wall thickening of the first and second portion of the duodenal C sweep with adjacent free fluid. Findings may be due to duodenitis/peptic ulcer disease. 2. New 2.5 cm simple cystic structure over the head of the pancreas. This may represent a pseudocyst or or cyst and less likely low-grade cystic neoplasm. Stable 1.3 cm low-attenuation over the more distal aspect of the pancreatic head. Recommend follow-up CT 2 years. 3. Mild diffuse low-attenuation of the liver compatible with a degree of steatosis or possible cirrhosis. Mild stable splenomegaly. Minimal ascites. 4. Colonic diverticulosis without active inflammation. 5. Table enlargement of the prostate with impression upon the bladder base. 6. Moderate size bilateral pleural effusions with associated bibasilar airspace density which may be atelectasis versus infection. 7. Aortic atherosclerosis. Possible focal dissection of the infrarenal abdominal aorta as the aorta is otherwise normal in caliber. Aortic Atherosclerosis (ICD10-I70.0). Electronically Signed   By: Toribio Agreste M.D.   On: 12/16/2024 12:51   CT Head Wo Contrast Result Date: 12/16/2024 EXAM: CT HEAD WITHOUT CONTRAST 12/16/2024 10:40:49 AM TECHNIQUE: CT of the head was performed without the administration of intravenous contrast. Automated exposure control, iterative reconstruction, and/or weight based adjustment of the mA/kV was utilized to reduce the radiation dose to as low as reasonably achievable. COMPARISON: MRI  of the head dated 12/13/2024. CLINICAL HISTORY: Mental status change, unknown cause. FINDINGS: BRAIN AND VENTRICLES: No acute hemorrhage. No evidence of acute infarct. No hydrocephalus. No extra-axial collection. No mass effect or midline shift. Moderate chronic microvascular ischemic change sequela. Calcified meningioma along right frontal convexity. ORBITS: Bilateral lens replacement. SINUSES: No acute abnormality. SOFT TISSUES AND SKULL: No acute soft tissue abnormality. No skull fracture. IMPRESSION: 1. No acute intracranial abnormality. 2. Moderate chronic microvascular ischemic change sequela. 3. Calcified meningioma along right frontal convexity. 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    Permanent AF SSS Sx bradycardia  Intermittently conversant this morning and spoke with his partner of 30 years who said that he is interested in potentially transitioning to comfort care.  The rest of his family is not here today and I do sense that there is some disagreement within the family about how aggressive they want to be with his care.  His partner is HCPOA and was expressing that he may want to limit additional treatment.  He is intermittently interactive this morning but is extremely fatigued.  His son will  be back today along with the rest of his family from [place] .  After our discussion yesterday his son seems to be struggling the most with his trajectory.  His daughter was also at bedside yesterday and had a fair amount of questions regarding PPM and whether this would be of any benefit at home.  With persistent leukocytosis and prior ESBL colonization this may be contributing to his decline.  No indication for PPM with AF/VR 60s this  morning.  I would favor removal of the TVP today as I had discussed with the entire family yesterday and they were agreeable at that time.  If his family has more questions today I am happy to come back and talk with him further otherwise call if there are recurrent prolonged episodes of ventricular asystole in the setting of AF/SVR.   For questions or updates, please contact [facility] Please consult [email or web address] for contact info under   Signed, Donnice DELENA Primus, MD  January 18, 2025 6:27 AM     [1]  Allergies Allergen Reactions   Aspirin     GI Bleeding Other reaction(s): upset stomach   Bee Venom     Other reaction(s): Unknown   Tamsulosin     Other reaction(s): increased frequency   Nifedipine Other (See Comments)    Headaches  Other reaction(s): headaches   "

## 2024-12-31 NOTE — Progress Notes (Addendum)
 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 PRN medications for pain, fever)  Xaivier Malay 09-Jan-2025, 1:28 AM

## 2024-12-31 NOTE — Progress Notes (Signed)
 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 OT to sign off.)  [name],[name] 01-15-25, 1:10 PM Kreg Sink, OT/L   Acute OT Clinical Specialist Acute Rehabilitation Services Pager [phone] Office [phone]

## 2024-12-31 DEATH — deceased

## 2025-04-07 ENCOUNTER — Ambulatory Visit: Admitting: Neurology
